# Patient Record
Sex: Female | Born: 1935 | Race: White | Hispanic: No | Smoking: Never smoker
Health system: Southern US, Community
[De-identification: ages and names within clinical notes are randomized; demographics above are authoritative.]

## PROBLEM LIST (undated history)

## (undated) DIAGNOSIS — IMO0002 Reserved for concepts with insufficient information to code with codable children: Secondary | ICD-10-CM

## (undated) DIAGNOSIS — I2699 Other pulmonary embolism without acute cor pulmonale: Secondary | ICD-10-CM

## (undated) DIAGNOSIS — Z789 Other specified health status: Secondary | ICD-10-CM

## (undated) DIAGNOSIS — K219 Gastro-esophageal reflux disease without esophagitis: Secondary | ICD-10-CM

## (undated) DIAGNOSIS — I2510 Atherosclerotic heart disease of native coronary artery without angina pectoris: Secondary | ICD-10-CM

## (undated) DIAGNOSIS — Z7901 Long term (current) use of anticoagulants: Secondary | ICD-10-CM

## (undated) DIAGNOSIS — I10 Essential (primary) hypertension: Secondary | ICD-10-CM

## (undated) DIAGNOSIS — K589 Irritable bowel syndrome without diarrhea: Secondary | ICD-10-CM

## (undated) DIAGNOSIS — F22 Delusional disorders: Secondary | ICD-10-CM

## (undated) DIAGNOSIS — I739 Peripheral vascular disease, unspecified: Secondary | ICD-10-CM

## (undated) DIAGNOSIS — K449 Diaphragmatic hernia without obstruction or gangrene: Secondary | ICD-10-CM

## (undated) DIAGNOSIS — F0392 Unspecified dementia, unspecified severity, with psychotic disturbance: Secondary | ICD-10-CM

## (undated) DIAGNOSIS — J411 Mucopurulent chronic bronchitis: Secondary | ICD-10-CM

## (undated) DIAGNOSIS — F0390 Unspecified dementia without behavioral disturbance: Secondary | ICD-10-CM

## (undated) DIAGNOSIS — Z951 Presence of aortocoronary bypass graft: Secondary | ICD-10-CM

## (undated) DIAGNOSIS — E785 Hyperlipidemia, unspecified: Secondary | ICD-10-CM

## (undated) DIAGNOSIS — K222 Esophageal obstruction: Secondary | ICD-10-CM

## (undated) DIAGNOSIS — T148XXA Other injury of unspecified body region, initial encounter: Secondary | ICD-10-CM

## (undated) DIAGNOSIS — I779 Disorder of arteries and arterioles, unspecified: Secondary | ICD-10-CM

## (undated) DIAGNOSIS — R002 Palpitations: Secondary | ICD-10-CM

## (undated) DIAGNOSIS — M1990 Unspecified osteoarthritis, unspecified site: Secondary | ICD-10-CM

## (undated) DIAGNOSIS — E669 Obesity, unspecified: Secondary | ICD-10-CM

## (undated) DIAGNOSIS — N200 Calculus of kidney: Secondary | ICD-10-CM

## (undated) DIAGNOSIS — R9430 Abnormal result of cardiovascular function study, unspecified: Secondary | ICD-10-CM

## (undated) DIAGNOSIS — I771 Stricture of artery: Secondary | ICD-10-CM

## (undated) DIAGNOSIS — F419 Anxiety disorder, unspecified: Secondary | ICD-10-CM

## (undated) DIAGNOSIS — S8991XA Unspecified injury of right lower leg, initial encounter: Secondary | ICD-10-CM

## (undated) DIAGNOSIS — R0602 Shortness of breath: Secondary | ICD-10-CM

## (undated) HISTORY — DX: Essential (primary) hypertension: I10

## (undated) HISTORY — DX: Disorder of arteries and arterioles, unspecified: I77.9

## (undated) HISTORY — DX: Obesity, unspecified: E66.9

## (undated) HISTORY — DX: Hyperlipidemia, unspecified: E78.5

## (undated) HISTORY — PX: ABDOMINAL HYSTERECTOMY: SHX81

## (undated) HISTORY — DX: Unspecified injury of right lower leg, initial encounter: S89.91XA

## (undated) HISTORY — PX: HEMORRHOID SURGERY: SHX153

## (undated) HISTORY — DX: Unspecified dementia without behavioral disturbance: F03.90

## (undated) HISTORY — DX: Peripheral vascular disease, unspecified: I73.9

## (undated) HISTORY — DX: Palpitations: R00.2

## (undated) HISTORY — DX: Reserved for concepts with insufficient information to code with codable children: IMO0002

## (undated) HISTORY — DX: Presence of aortocoronary bypass graft: Z95.1

## (undated) HISTORY — DX: Other specified health status: Z78.9

## (undated) HISTORY — DX: Delusional disorders: F22

## (undated) HISTORY — DX: Gastro-esophageal reflux disease without esophagitis: K21.9

## (undated) HISTORY — PX: APPENDECTOMY: SHX54

## (undated) HISTORY — DX: Shortness of breath: R06.02

## (undated) HISTORY — DX: Unspecified dementia, unspecified severity, with psychotic disturbance: F03.92

## (undated) HISTORY — DX: Esophageal obstruction: K22.2

## (undated) HISTORY — DX: Stricture of artery: I77.1

## (undated) HISTORY — DX: Other injury of unspecified body region, initial encounter: T14.8XXA

## (undated) HISTORY — DX: Unspecified dementia, unspecified severity, without behavioral disturbance, psychotic disturbance, mood disturbance, and anxiety: F03.90

## (undated) HISTORY — DX: Mucopurulent chronic bronchitis: J41.1

## (undated) HISTORY — PX: FRACTURE SURGERY: SHX138

## (undated) HISTORY — DX: Other pulmonary embolism without acute cor pulmonale: I26.99

## (undated) HISTORY — PX: CATARACT EXTRACTION: SUR2

## (undated) HISTORY — DX: Calculus of kidney: N20.0

## (undated) HISTORY — DX: Anxiety disorder, unspecified: F41.9

## (undated) HISTORY — DX: Long term (current) use of anticoagulants: Z79.01

## (undated) HISTORY — DX: Diaphragmatic hernia without obstruction or gangrene: K44.9

## (undated) HISTORY — DX: Unspecified osteoarthritis, unspecified site: M19.90

## (undated) HISTORY — DX: Irritable bowel syndrome, unspecified: K58.9

## (undated) HISTORY — DX: Atherosclerotic heart disease of native coronary artery without angina pectoris: I25.10

## (undated) HISTORY — DX: Abnormal result of cardiovascular function study, unspecified: R94.30

---

## 1997-09-22 ENCOUNTER — Encounter: Payer: Self-pay | Admitting: Internal Medicine

## 1997-10-11 ENCOUNTER — Encounter: Payer: Self-pay | Admitting: Internal Medicine

## 2000-12-05 ENCOUNTER — Ambulatory Visit (HOSPITAL_COMMUNITY): Admission: RE | Admit: 2000-12-05 | Discharge: 2000-12-06 | Payer: Self-pay | Admitting: Cardiology

## 2000-12-05 ENCOUNTER — Encounter: Payer: Self-pay | Admitting: Cardiology

## 2000-12-08 ENCOUNTER — Inpatient Hospital Stay (HOSPITAL_COMMUNITY): Admission: EM | Admit: 2000-12-08 | Discharge: 2000-12-15 | Payer: Self-pay | Admitting: Emergency Medicine

## 2000-12-08 ENCOUNTER — Encounter: Payer: Self-pay | Admitting: Emergency Medicine

## 2000-12-23 ENCOUNTER — Encounter: Admission: RE | Admit: 2000-12-23 | Discharge: 2000-12-23 | Payer: Self-pay | Admitting: Cardiology

## 2001-01-14 ENCOUNTER — Encounter (HOSPITAL_COMMUNITY): Admission: RE | Admit: 2001-01-14 | Discharge: 2001-04-14 | Payer: Self-pay | Admitting: Cardiology

## 2001-04-15 ENCOUNTER — Encounter (HOSPITAL_COMMUNITY): Admission: RE | Admit: 2001-04-15 | Discharge: 2001-07-14 | Payer: Self-pay | Admitting: Cardiology

## 2001-07-15 ENCOUNTER — Encounter (HOSPITAL_COMMUNITY): Admission: RE | Admit: 2001-07-15 | Discharge: 2001-09-05 | Payer: Self-pay | Admitting: Cardiology

## 2001-08-28 ENCOUNTER — Inpatient Hospital Stay (HOSPITAL_COMMUNITY): Admission: EM | Admit: 2001-08-28 | Discharge: 2001-09-09 | Payer: Self-pay

## 2001-09-04 ENCOUNTER — Encounter: Payer: Self-pay | Admitting: Thoracic Surgery (Cardiothoracic Vascular Surgery)

## 2001-09-05 ENCOUNTER — Encounter: Payer: Self-pay | Admitting: Thoracic Surgery (Cardiothoracic Vascular Surgery)

## 2001-09-06 ENCOUNTER — Encounter: Payer: Self-pay | Admitting: Thoracic Surgery (Cardiothoracic Vascular Surgery)

## 2001-10-21 ENCOUNTER — Encounter (HOSPITAL_COMMUNITY): Admission: RE | Admit: 2001-10-21 | Discharge: 2001-11-04 | Payer: Self-pay | Admitting: Cardiology

## 2001-11-05 HISTORY — PX: OTHER SURGICAL HISTORY: SHX169

## 2001-11-14 ENCOUNTER — Encounter: Payer: Self-pay | Admitting: Pulmonary Disease

## 2001-11-14 ENCOUNTER — Encounter: Admission: RE | Admit: 2001-11-14 | Discharge: 2001-11-14 | Payer: Self-pay | Admitting: Pulmonary Disease

## 2001-11-28 ENCOUNTER — Inpatient Hospital Stay (HOSPITAL_COMMUNITY): Admission: EM | Admit: 2001-11-28 | Discharge: 2001-12-09 | Payer: Self-pay | Admitting: Emergency Medicine

## 2001-12-01 ENCOUNTER — Encounter: Payer: Self-pay | Admitting: Pulmonary Disease

## 2001-12-19 ENCOUNTER — Encounter (HOSPITAL_BASED_OUTPATIENT_CLINIC_OR_DEPARTMENT_OTHER): Payer: Self-pay | Admitting: Internal Medicine

## 2001-12-19 ENCOUNTER — Ambulatory Visit (HOSPITAL_COMMUNITY): Admission: RE | Admit: 2001-12-19 | Discharge: 2001-12-19 | Payer: Self-pay | Admitting: Internal Medicine

## 2002-05-05 ENCOUNTER — Encounter (HOSPITAL_COMMUNITY): Admission: RE | Admit: 2002-05-05 | Discharge: 2002-08-03 | Payer: Self-pay | Admitting: Cardiology

## 2002-07-04 ENCOUNTER — Emergency Department (HOSPITAL_COMMUNITY): Admission: EM | Admit: 2002-07-04 | Discharge: 2002-07-04 | Payer: Self-pay

## 2002-08-05 ENCOUNTER — Encounter (HOSPITAL_COMMUNITY): Admission: RE | Admit: 2002-08-05 | Discharge: 2002-11-03 | Payer: Self-pay | Admitting: Cardiology

## 2002-08-27 ENCOUNTER — Inpatient Hospital Stay (HOSPITAL_COMMUNITY): Admission: EM | Admit: 2002-08-27 | Discharge: 2002-08-28 | Payer: Self-pay

## 2002-08-27 ENCOUNTER — Encounter: Payer: Self-pay | Admitting: Pulmonary Disease

## 2002-09-02 ENCOUNTER — Inpatient Hospital Stay (HOSPITAL_COMMUNITY): Admission: RE | Admit: 2002-09-02 | Discharge: 2002-09-04 | Payer: Self-pay | Admitting: Gastroenterology

## 2002-11-05 ENCOUNTER — Encounter (HOSPITAL_COMMUNITY): Admission: RE | Admit: 2002-11-05 | Discharge: 2003-02-03 | Payer: Self-pay | Admitting: Cardiology

## 2003-02-08 ENCOUNTER — Encounter: Payer: Self-pay | Admitting: Pulmonary Disease

## 2003-02-08 ENCOUNTER — Inpatient Hospital Stay (HOSPITAL_COMMUNITY): Admission: AD | Admit: 2003-02-08 | Discharge: 2003-02-10 | Payer: Self-pay | Admitting: Pulmonary Disease

## 2003-02-09 ENCOUNTER — Encounter: Payer: Self-pay | Admitting: Cardiology

## 2003-04-07 ENCOUNTER — Encounter (HOSPITAL_COMMUNITY): Admission: RE | Admit: 2003-04-07 | Discharge: 2003-07-06 | Payer: Self-pay | Admitting: Cardiology

## 2003-06-06 ENCOUNTER — Encounter: Payer: Self-pay | Admitting: Pulmonary Disease

## 2003-06-06 ENCOUNTER — Ambulatory Visit (HOSPITAL_COMMUNITY): Admission: RE | Admit: 2003-06-06 | Discharge: 2003-06-06 | Payer: Self-pay | Admitting: Pulmonary Disease

## 2003-06-27 ENCOUNTER — Encounter: Payer: Self-pay | Admitting: Emergency Medicine

## 2003-06-28 ENCOUNTER — Encounter: Payer: Self-pay | Admitting: Emergency Medicine

## 2003-06-28 ENCOUNTER — Inpatient Hospital Stay (HOSPITAL_COMMUNITY): Admission: EM | Admit: 2003-06-28 | Discharge: 2003-07-06 | Payer: Self-pay | Admitting: Emergency Medicine

## 2003-11-24 ENCOUNTER — Inpatient Hospital Stay (HOSPITAL_COMMUNITY): Admission: AD | Admit: 2003-11-24 | Discharge: 2003-11-26 | Payer: Self-pay | Admitting: Orthopedic Surgery

## 2003-12-01 ENCOUNTER — Inpatient Hospital Stay (HOSPITAL_COMMUNITY): Admission: RE | Admit: 2003-12-01 | Discharge: 2003-12-04 | Payer: Self-pay | Admitting: Orthopedic Surgery

## 2003-12-04 ENCOUNTER — Inpatient Hospital Stay
Admission: RE | Admit: 2003-12-04 | Discharge: 2003-12-10 | Payer: Self-pay | Admitting: Physical Medicine & Rehabilitation

## 2004-03-10 ENCOUNTER — Emergency Department (HOSPITAL_COMMUNITY): Admission: EM | Admit: 2004-03-10 | Discharge: 2004-03-11 | Payer: Self-pay | Admitting: Emergency Medicine

## 2004-05-16 ENCOUNTER — Encounter (HOSPITAL_COMMUNITY): Admission: RE | Admit: 2004-05-16 | Discharge: 2004-08-14 | Payer: Self-pay | Admitting: Cardiology

## 2004-07-30 ENCOUNTER — Emergency Department (HOSPITAL_COMMUNITY): Admission: EM | Admit: 2004-07-30 | Discharge: 2004-07-30 | Payer: Self-pay | Admitting: Emergency Medicine

## 2004-09-05 ENCOUNTER — Encounter (HOSPITAL_COMMUNITY): Admission: RE | Admit: 2004-09-05 | Discharge: 2004-12-04 | Payer: Self-pay | Admitting: Cardiology

## 2004-09-15 ENCOUNTER — Ambulatory Visit: Payer: Self-pay | Admitting: Cardiology

## 2004-10-06 ENCOUNTER — Ambulatory Visit: Payer: Self-pay | Admitting: Cardiology

## 2004-11-03 ENCOUNTER — Ambulatory Visit: Payer: Self-pay | Admitting: Internal Medicine

## 2004-11-07 ENCOUNTER — Ambulatory Visit: Payer: Self-pay | Admitting: Pulmonary Disease

## 2004-11-14 ENCOUNTER — Ambulatory Visit: Payer: Self-pay | Admitting: Cardiology

## 2004-12-01 ENCOUNTER — Ambulatory Visit: Payer: Self-pay | Admitting: Pulmonary Disease

## 2004-12-05 ENCOUNTER — Ambulatory Visit: Payer: Self-pay | Admitting: Cardiology

## 2004-12-06 ENCOUNTER — Encounter (HOSPITAL_COMMUNITY): Admission: RE | Admit: 2004-12-06 | Discharge: 2005-03-06 | Payer: Self-pay | Admitting: Cardiology

## 2004-12-25 ENCOUNTER — Ambulatory Visit: Payer: Self-pay | Admitting: *Deleted

## 2005-01-19 ENCOUNTER — Ambulatory Visit: Payer: Self-pay | Admitting: Cardiology

## 2005-01-29 ENCOUNTER — Ambulatory Visit: Payer: Self-pay | Admitting: Cardiology

## 2005-02-19 ENCOUNTER — Ambulatory Visit: Payer: Self-pay | Admitting: Cardiology

## 2005-02-23 ENCOUNTER — Ambulatory Visit: Payer: Self-pay | Admitting: Cardiology

## 2005-03-07 ENCOUNTER — Encounter (HOSPITAL_COMMUNITY): Admission: RE | Admit: 2005-03-07 | Discharge: 2005-06-05 | Payer: Self-pay | Admitting: Cardiology

## 2005-03-13 ENCOUNTER — Ambulatory Visit: Payer: Self-pay | Admitting: Pulmonary Disease

## 2005-03-19 ENCOUNTER — Ambulatory Visit: Payer: Self-pay | Admitting: Family Medicine

## 2005-03-23 ENCOUNTER — Ambulatory Visit: Payer: Self-pay | Admitting: Cardiovascular Disease

## 2005-03-30 ENCOUNTER — Ambulatory Visit: Payer: Self-pay | Admitting: Cardiology

## 2005-04-04 ENCOUNTER — Ambulatory Visit: Payer: Self-pay | Admitting: Cardiology

## 2005-04-20 ENCOUNTER — Ambulatory Visit: Payer: Self-pay | Admitting: Cardiology

## 2005-05-18 ENCOUNTER — Ambulatory Visit: Payer: Self-pay | Admitting: Cardiology

## 2005-06-06 ENCOUNTER — Encounter (HOSPITAL_COMMUNITY): Admission: RE | Admit: 2005-06-06 | Discharge: 2005-09-04 | Payer: Self-pay | Admitting: Cardiology

## 2005-06-15 ENCOUNTER — Ambulatory Visit: Payer: Self-pay | Admitting: Cardiology

## 2005-07-13 ENCOUNTER — Ambulatory Visit: Payer: Self-pay | Admitting: Pulmonary Disease

## 2005-07-30 ENCOUNTER — Ambulatory Visit: Payer: Self-pay | Admitting: Internal Medicine

## 2005-08-02 ENCOUNTER — Ambulatory Visit: Payer: Self-pay | Admitting: Pulmonary Disease

## 2005-08-09 ENCOUNTER — Ambulatory Visit: Payer: Self-pay | Admitting: Pulmonary Disease

## 2005-08-16 ENCOUNTER — Ambulatory Visit: Payer: Self-pay | Admitting: Cardiology

## 2005-09-03 ENCOUNTER — Ambulatory Visit: Payer: Self-pay | Admitting: Pulmonary Disease

## 2005-09-05 ENCOUNTER — Encounter: Admission: RE | Admit: 2005-09-05 | Discharge: 2005-12-04 | Payer: Self-pay | Admitting: Cardiology

## 2005-09-10 ENCOUNTER — Ambulatory Visit: Payer: Self-pay | Admitting: Internal Medicine

## 2005-09-14 ENCOUNTER — Ambulatory Visit: Payer: Self-pay | Admitting: Cardiology

## 2005-10-05 ENCOUNTER — Ambulatory Visit: Payer: Self-pay | Admitting: Cardiovascular Disease

## 2005-11-02 ENCOUNTER — Ambulatory Visit: Payer: Self-pay | Admitting: Cardiology

## 2005-11-12 ENCOUNTER — Ambulatory Visit: Payer: Self-pay | Admitting: Pulmonary Disease

## 2005-11-30 ENCOUNTER — Ambulatory Visit: Payer: Self-pay | Admitting: Cardiology

## 2005-12-06 ENCOUNTER — Encounter (HOSPITAL_COMMUNITY): Admission: RE | Admit: 2005-12-06 | Discharge: 2006-03-06 | Payer: Self-pay | Admitting: Cardiology

## 2005-12-17 ENCOUNTER — Ambulatory Visit: Payer: Self-pay | Admitting: Pulmonary Disease

## 2005-12-25 ENCOUNTER — Ambulatory Visit: Payer: Self-pay | Admitting: Pulmonary Disease

## 2005-12-28 ENCOUNTER — Ambulatory Visit: Payer: Self-pay | Admitting: Internal Medicine

## 2006-01-15 ENCOUNTER — Ambulatory Visit: Payer: Self-pay | Admitting: Pulmonary Disease

## 2006-01-22 ENCOUNTER — Ambulatory Visit: Payer: Self-pay | Admitting: Pulmonary Disease

## 2006-01-25 ENCOUNTER — Ambulatory Visit: Payer: Self-pay | Admitting: Cardiovascular Disease

## 2006-01-31 ENCOUNTER — Ambulatory Visit: Payer: Self-pay

## 2006-01-31 ENCOUNTER — Encounter: Payer: Self-pay | Admitting: Internal Medicine

## 2006-01-31 ENCOUNTER — Ambulatory Visit: Payer: Self-pay | Admitting: Cardiology

## 2006-02-19 ENCOUNTER — Ambulatory Visit: Payer: Self-pay | Admitting: Pulmonary Disease

## 2006-02-21 ENCOUNTER — Ambulatory Visit: Payer: Self-pay | Admitting: Internal Medicine

## 2006-03-05 ENCOUNTER — Ambulatory Visit: Payer: Self-pay | Admitting: Cardiology

## 2006-03-07 ENCOUNTER — Encounter (HOSPITAL_COMMUNITY): Admission: RE | Admit: 2006-03-07 | Discharge: 2006-06-05 | Payer: Self-pay | Admitting: Cardiology

## 2006-03-11 ENCOUNTER — Ambulatory Visit: Payer: Self-pay | Admitting: Internal Medicine

## 2006-03-27 ENCOUNTER — Ambulatory Visit: Payer: Self-pay | Admitting: Cardiology

## 2006-04-03 ENCOUNTER — Ambulatory Visit: Payer: Self-pay | Admitting: Cardiovascular Disease

## 2006-04-08 ENCOUNTER — Ambulatory Visit: Payer: Self-pay | Admitting: Pulmonary Disease

## 2006-04-15 ENCOUNTER — Ambulatory Visit: Payer: Self-pay | Admitting: Cardiology

## 2006-04-29 ENCOUNTER — Ambulatory Visit: Payer: Self-pay | Admitting: Cardiology

## 2006-05-07 ENCOUNTER — Ambulatory Visit: Payer: Self-pay | Admitting: Pulmonary Disease

## 2006-05-13 ENCOUNTER — Ambulatory Visit: Payer: Self-pay | Admitting: Internal Medicine

## 2006-05-15 ENCOUNTER — Ambulatory Visit: Payer: Self-pay | Admitting: Internal Medicine

## 2006-05-21 ENCOUNTER — Ambulatory Visit: Payer: Self-pay | Admitting: Cardiology

## 2006-05-22 ENCOUNTER — Ambulatory Visit: Payer: Self-pay | Admitting: Internal Medicine

## 2006-05-30 ENCOUNTER — Ambulatory Visit (HOSPITAL_COMMUNITY): Admission: RE | Admit: 2006-05-30 | Discharge: 2006-05-30 | Payer: Self-pay | Admitting: Internal Medicine

## 2006-05-30 ENCOUNTER — Encounter: Payer: Self-pay | Admitting: Internal Medicine

## 2006-06-04 ENCOUNTER — Ambulatory Visit: Payer: Self-pay | Admitting: Internal Medicine

## 2006-06-04 ENCOUNTER — Ambulatory Visit: Payer: Self-pay | Admitting: Cardiology

## 2006-06-06 ENCOUNTER — Encounter (HOSPITAL_COMMUNITY): Admission: RE | Admit: 2006-06-06 | Discharge: 2006-09-04 | Payer: Self-pay | Admitting: Cardiology

## 2006-06-10 ENCOUNTER — Ambulatory Visit: Payer: Self-pay | Admitting: Internal Medicine

## 2006-06-13 ENCOUNTER — Emergency Department (HOSPITAL_COMMUNITY): Admission: EM | Admit: 2006-06-13 | Discharge: 2006-06-13 | Payer: Self-pay | Admitting: Emergency Medicine

## 2006-06-24 ENCOUNTER — Ambulatory Visit: Payer: Self-pay | Admitting: Internal Medicine

## 2006-07-01 ENCOUNTER — Ambulatory Visit: Payer: Self-pay | Admitting: Pulmonary Disease

## 2006-07-10 ENCOUNTER — Ambulatory Visit: Payer: Self-pay | Admitting: *Deleted

## 2006-07-24 ENCOUNTER — Ambulatory Visit: Payer: Self-pay | Admitting: Cardiology

## 2006-08-14 ENCOUNTER — Ambulatory Visit: Payer: Self-pay | Admitting: Cardiology

## 2006-08-28 ENCOUNTER — Ambulatory Visit: Payer: Self-pay | Admitting: *Deleted

## 2006-09-11 ENCOUNTER — Ambulatory Visit: Payer: Self-pay | Admitting: Cardiology

## 2006-09-20 ENCOUNTER — Ambulatory Visit: Payer: Self-pay | Admitting: Cardiovascular Disease

## 2006-09-24 ENCOUNTER — Ambulatory Visit: Payer: Self-pay | Admitting: Pulmonary Disease

## 2006-10-08 ENCOUNTER — Ambulatory Visit: Payer: Self-pay | Admitting: Cardiology

## 2006-10-17 ENCOUNTER — Ambulatory Visit: Payer: Self-pay

## 2006-10-24 ENCOUNTER — Ambulatory Visit: Payer: Self-pay | Admitting: Cardiology

## 2006-11-18 ENCOUNTER — Ambulatory Visit: Payer: Self-pay | Admitting: Cardiology

## 2006-12-05 ENCOUNTER — Ambulatory Visit: Payer: Self-pay | Admitting: Cardiology

## 2007-01-01 ENCOUNTER — Ambulatory Visit: Payer: Self-pay | Admitting: Internal Medicine

## 2007-01-15 ENCOUNTER — Ambulatory Visit: Payer: Self-pay | Admitting: Cardiology

## 2007-01-22 ENCOUNTER — Ambulatory Visit: Payer: Self-pay | Admitting: Pulmonary Disease

## 2007-01-22 LAB — CONVERTED CEMR LAB
BUN: 11 mg/dL (ref 6–23)
Basophils Absolute: 0.1 10*3/uL (ref 0.0–0.1)
Bilirubin, Direct: 0.1 mg/dL (ref 0.0–0.3)
CO2: 31 meq/L (ref 19–32)
Chloride: 108 meq/L (ref 96–112)
Creatinine, Ser: 0.8 mg/dL (ref 0.4–1.2)
HCT: 39 % (ref 36.0–46.0)
Hgb A1c MFr Bld: 6.7 % — ABNORMAL HIGH (ref 4.6–6.0)
MCV: 88 fL (ref 78.0–100.0)
Neutrophils Relative %: 51.8 % (ref 43.0–77.0)
Platelets: 207 10*3/uL (ref 150–400)
Potassium: 4.2 meq/L (ref 3.5–5.1)
RBC: 4.43 M/uL (ref 3.87–5.11)
RDW: 13.3 % (ref 11.5–14.6)
TSH: 1.99 microintl units/mL (ref 0.35–5.50)

## 2007-02-10 ENCOUNTER — Ambulatory Visit: Payer: Self-pay | Admitting: Cardiology

## 2007-02-18 ENCOUNTER — Ambulatory Visit: Payer: Self-pay | Admitting: Pulmonary Disease

## 2007-02-24 ENCOUNTER — Ambulatory Visit: Payer: Self-pay | Admitting: Cardiology

## 2007-03-26 ENCOUNTER — Ambulatory Visit: Payer: Self-pay | Admitting: Cardiology

## 2007-04-23 ENCOUNTER — Ambulatory Visit: Payer: Self-pay | Admitting: Cardiology

## 2007-04-28 ENCOUNTER — Ambulatory Visit: Payer: Self-pay | Admitting: Pulmonary Disease

## 2007-05-01 ENCOUNTER — Ambulatory Visit: Payer: Self-pay | Admitting: Pulmonary Disease

## 2007-05-01 LAB — CONVERTED CEMR LAB
AST: 23 units/L (ref 0–37)
Alkaline Phosphatase: 45 units/L (ref 39–117)
BUN: 8 mg/dL (ref 6–23)
CO2: 30 meq/L (ref 19–32)
Chloride: 112 meq/L (ref 96–112)
Cholesterol: 152 mg/dL (ref 0–200)
Creatinine, Ser: 0.9 mg/dL (ref 0.4–1.2)
Direct LDL: 71.4 mg/dL
HDL: 33.5 mg/dL — ABNORMAL LOW (ref >39.0)
Hgb A1c MFr Bld: 6.4 % — ABNORMAL HIGH (ref 4.6–6.0)
Sodium: 147 meq/L — ABNORMAL HIGH (ref 135–145)
Total Protein: 6.2 g/dL (ref 6.0–8.3)
Triglycerides: 276 mg/dL (ref 0–149)

## 2007-05-06 ENCOUNTER — Ambulatory Visit: Payer: Self-pay | Admitting: Cardiology

## 2007-05-14 ENCOUNTER — Ambulatory Visit: Payer: Self-pay | Admitting: Cardiology

## 2007-05-14 ENCOUNTER — Observation Stay (HOSPITAL_COMMUNITY): Admission: EM | Admit: 2007-05-14 | Discharge: 2007-05-15 | Payer: Self-pay | Admitting: Emergency Medicine

## 2007-05-21 ENCOUNTER — Ambulatory Visit: Payer: Self-pay | Admitting: Internal Medicine

## 2007-05-23 ENCOUNTER — Ambulatory Visit: Payer: Self-pay

## 2007-05-30 ENCOUNTER — Ambulatory Visit: Payer: Self-pay | Admitting: Cardiology

## 2007-06-11 ENCOUNTER — Ambulatory Visit: Payer: Self-pay | Admitting: Cardiology

## 2007-06-26 ENCOUNTER — Ambulatory Visit: Payer: Self-pay | Admitting: Cardiology

## 2007-07-09 ENCOUNTER — Ambulatory Visit: Payer: Self-pay | Admitting: Internal Medicine

## 2007-07-24 ENCOUNTER — Ambulatory Visit: Payer: Self-pay | Admitting: Cardiology

## 2007-08-07 ENCOUNTER — Ambulatory Visit: Payer: Self-pay | Admitting: Cardiology

## 2007-08-28 ENCOUNTER — Ambulatory Visit: Payer: Self-pay | Admitting: Cardiology

## 2007-09-01 ENCOUNTER — Ambulatory Visit: Payer: Self-pay | Admitting: Cardiology

## 2007-09-02 ENCOUNTER — Ambulatory Visit: Payer: Self-pay | Admitting: Pulmonary Disease

## 2007-09-25 ENCOUNTER — Ambulatory Visit: Payer: Self-pay | Admitting: Cardiology

## 2007-11-10 DIAGNOSIS — M1990 Unspecified osteoarthritis, unspecified site: Secondary | ICD-10-CM | POA: Insufficient documentation

## 2007-11-10 DIAGNOSIS — K589 Irritable bowel syndrome without diarrhea: Secondary | ICD-10-CM | POA: Insufficient documentation

## 2007-11-10 DIAGNOSIS — F411 Generalized anxiety disorder: Secondary | ICD-10-CM | POA: Insufficient documentation

## 2007-11-17 ENCOUNTER — Telehealth (INDEPENDENT_AMBULATORY_CARE_PROVIDER_SITE_OTHER): Payer: Self-pay | Admitting: *Deleted

## 2007-11-21 ENCOUNTER — Ambulatory Visit: Payer: Self-pay | Admitting: Cardiology

## 2007-12-18 ENCOUNTER — Ambulatory Visit: Payer: Self-pay | Admitting: Pulmonary Disease

## 2007-12-18 DIAGNOSIS — N200 Calculus of kidney: Secondary | ICD-10-CM | POA: Insufficient documentation

## 2007-12-18 DIAGNOSIS — J42 Unspecified chronic bronchitis: Secondary | ICD-10-CM

## 2007-12-18 DIAGNOSIS — R930 Abnormal findings on diagnostic imaging of skull and head, not elsewhere classified: Secondary | ICD-10-CM | POA: Insufficient documentation

## 2007-12-18 DIAGNOSIS — M810 Age-related osteoporosis without current pathological fracture: Secondary | ICD-10-CM | POA: Insufficient documentation

## 2007-12-18 DIAGNOSIS — I739 Peripheral vascular disease, unspecified: Secondary | ICD-10-CM

## 2007-12-19 ENCOUNTER — Ambulatory Visit: Payer: Self-pay | Admitting: Internal Medicine

## 2007-12-24 ENCOUNTER — Ambulatory Visit: Payer: Self-pay | Admitting: Pulmonary Disease

## 2007-12-27 LAB — CONVERTED CEMR LAB
AST: 24 units/L (ref 0–37)
Bilirubin, Direct: 0.1 mg/dL (ref 0.0–0.3)
Chloride: 110 meq/L (ref 96–112)
Creatinine, Ser: 0.9 mg/dL (ref 0.4–1.2)
Direct LDL: 75.6 mg/dL
Eosinophils Absolute: 0.3 10*3/uL (ref 0.0–0.6)
Eosinophils Relative: 4.3 % (ref 0.0–5.0)
GFR calc non Af Amer: 65 mL/min
Glucose, Bld: 124 mg/dL — ABNORMAL HIGH (ref 70–99)
HCT: 38.5 % (ref 36.0–46.0)
Hemoglobin: 12.9 g/dL (ref 12.0–15.0)
Hgb A1c MFr Bld: 6.6 % — ABNORMAL HIGH (ref 4.6–6.0)
Lymphocytes Relative: 34.6 % (ref 12.0–46.0)
MCHC: 33.5 g/dL (ref 30.0–36.0)
MCV: 91.1 fL (ref 78.0–100.0)
Monocytes Absolute: 0.5 10*3/uL (ref 0.2–0.7)
Neutrophils Relative %: 51.6 % (ref 43.0–77.0)
Potassium: 5 meq/L (ref 3.5–5.1)
RBC: 4.22 M/uL (ref 3.87–5.11)
Sodium: 145 meq/L (ref 135–145)
Total Bilirubin: 0.6 mg/dL (ref 0.3–1.2)
Triglycerides: 249 mg/dL (ref 0–149)
WBC: 5.9 10*3/uL (ref 4.5–10.5)

## 2008-01-09 ENCOUNTER — Ambulatory Visit: Payer: Self-pay | Admitting: Cardiology

## 2008-01-23 ENCOUNTER — Ambulatory Visit: Payer: Self-pay | Admitting: Cardiovascular Disease

## 2008-02-06 ENCOUNTER — Ambulatory Visit: Payer: Self-pay | Admitting: Cardiology

## 2008-03-02 ENCOUNTER — Ambulatory Visit: Payer: Self-pay | Admitting: Cardiology

## 2008-03-16 ENCOUNTER — Ambulatory Visit: Payer: Self-pay | Admitting: Cardiovascular Disease

## 2008-03-16 ENCOUNTER — Ambulatory Visit: Payer: Self-pay | Admitting: Cardiology

## 2008-04-07 ENCOUNTER — Ambulatory Visit: Payer: Self-pay | Admitting: Cardiology

## 2008-04-16 ENCOUNTER — Ambulatory Visit: Payer: Self-pay | Admitting: Pulmonary Disease

## 2008-04-19 ENCOUNTER — Telehealth (INDEPENDENT_AMBULATORY_CARE_PROVIDER_SITE_OTHER): Payer: Self-pay | Admitting: *Deleted

## 2008-05-05 ENCOUNTER — Ambulatory Visit: Payer: Self-pay | Admitting: Internal Medicine

## 2008-06-02 ENCOUNTER — Ambulatory Visit: Payer: Self-pay | Admitting: Cardiology

## 2008-06-11 ENCOUNTER — Ambulatory Visit: Payer: Self-pay | Admitting: Cardiology

## 2008-06-23 ENCOUNTER — Ambulatory Visit: Payer: Self-pay | Admitting: Cardiology

## 2008-07-15 ENCOUNTER — Ambulatory Visit: Payer: Self-pay | Admitting: Pulmonary Disease

## 2008-07-15 ENCOUNTER — Ambulatory Visit: Payer: Self-pay | Admitting: Cardiology

## 2008-07-18 LAB — CONVERTED CEMR LAB
ALT: 22 units/L (ref 0–35)
AST: 28 units/L (ref 0–37)
Albumin: 4 g/dL (ref 3.5–5.2)
BUN: 13 mg/dL (ref 6–23)
Basophils Relative: 0.9 % (ref 0.0–3.0)
CO2: 29 meq/L (ref 19–32)
Chloride: 108 meq/L (ref 96–112)
Creatinine, Ser: 0.9 mg/dL (ref 0.4–1.2)
Eosinophils Relative: 3.7 % (ref 0.0–5.0)
Glucose, Bld: 103 mg/dL — ABNORMAL HIGH (ref 70–99)
Hgb A1c MFr Bld: 6.3 % — ABNORMAL HIGH (ref 4.6–6.0)
LDL Cholesterol: 55 mg/dL (ref 0–99)
Lymphocytes Relative: 31.7 % (ref 12.0–46.0)
Monocytes Relative: 6.9 % (ref 3.0–12.0)
Neutrophils Relative %: 56.8 % (ref 43.0–77.0)
RBC: 4.31 M/uL (ref 3.87–5.11)
Total Bilirubin: 0.7 mg/dL (ref 0.3–1.2)
VLDL: 28 mg/dL (ref 0–40)
WBC: 7.7 10*3/uL (ref 4.5–10.5)

## 2008-07-19 ENCOUNTER — Encounter: Payer: Self-pay | Admitting: Pulmonary Disease

## 2008-07-19 ENCOUNTER — Ambulatory Visit: Payer: Self-pay | Admitting: Internal Medicine

## 2008-07-20 ENCOUNTER — Encounter: Payer: Self-pay | Admitting: Pulmonary Disease

## 2008-08-06 ENCOUNTER — Ambulatory Visit: Payer: Self-pay | Admitting: Cardiovascular Disease

## 2008-08-27 ENCOUNTER — Ambulatory Visit: Payer: Self-pay | Admitting: Cardiology

## 2008-09-03 ENCOUNTER — Ambulatory Visit: Payer: Self-pay | Admitting: Internal Medicine

## 2008-09-13 ENCOUNTER — Telehealth: Payer: Self-pay | Admitting: Pulmonary Disease

## 2008-09-14 ENCOUNTER — Telehealth: Payer: Self-pay | Admitting: Pulmonary Disease

## 2008-09-16 ENCOUNTER — Ambulatory Visit: Payer: Self-pay | Admitting: Pulmonary Disease

## 2008-09-16 DIAGNOSIS — F0390 Unspecified dementia without behavioral disturbance: Secondary | ICD-10-CM

## 2008-09-16 DIAGNOSIS — F22 Delusional disorders: Secondary | ICD-10-CM

## 2008-09-20 ENCOUNTER — Encounter: Payer: Self-pay | Admitting: Pulmonary Disease

## 2008-09-20 ENCOUNTER — Encounter: Admission: RE | Admit: 2008-09-20 | Discharge: 2008-09-20 | Payer: Self-pay | Admitting: Neurology

## 2008-09-20 ENCOUNTER — Ambulatory Visit: Payer: Self-pay | Admitting: Cardiology

## 2008-09-28 ENCOUNTER — Emergency Department (HOSPITAL_COMMUNITY): Admission: EM | Admit: 2008-09-28 | Discharge: 2008-09-29 | Payer: Self-pay | Admitting: Emergency Medicine

## 2008-10-01 ENCOUNTER — Inpatient Hospital Stay (HOSPITAL_COMMUNITY): Admission: AD | Admit: 2008-10-01 | Discharge: 2008-10-08 | Payer: Self-pay | Admitting: Pulmonary Disease

## 2008-10-01 ENCOUNTER — Ambulatory Visit: Payer: Self-pay | Admitting: Emergency Medicine

## 2008-10-01 ENCOUNTER — Ambulatory Visit: Payer: Self-pay | Admitting: Internal Medicine

## 2008-11-30 ENCOUNTER — Ambulatory Visit: Payer: Self-pay | Admitting: Adult Health

## 2008-12-01 ENCOUNTER — Telehealth (INDEPENDENT_AMBULATORY_CARE_PROVIDER_SITE_OTHER): Payer: Self-pay | Admitting: *Deleted

## 2008-12-06 LAB — CONVERTED CEMR LAB
BUN: 16 mg/dL (ref 6–23)
GFR calc Af Amer: 104 mL/min
Glucose, Bld: 92 mg/dL (ref 70–99)
Potassium: 4.7 meq/L (ref 3.5–5.1)

## 2008-12-14 ENCOUNTER — Telehealth: Payer: Self-pay | Admitting: Pulmonary Disease

## 2008-12-17 ENCOUNTER — Encounter (HOSPITAL_BASED_OUTPATIENT_CLINIC_OR_DEPARTMENT_OTHER): Admission: RE | Admit: 2008-12-17 | Discharge: 2009-03-08 | Payer: Self-pay | Admitting: General Surgery

## 2008-12-31 ENCOUNTER — Ambulatory Visit: Payer: Self-pay | Admitting: Cardiology

## 2009-01-12 ENCOUNTER — Ambulatory Visit: Payer: Self-pay | Admitting: Pulmonary Disease

## 2009-01-14 ENCOUNTER — Encounter: Payer: Self-pay | Admitting: Pulmonary Disease

## 2009-01-14 LAB — CONVERTED CEMR LAB
Alkaline Phosphatase: 40 units/L (ref 39–117)
Basophils Relative: 0 % (ref 0.0–3.0)
Bilirubin, Direct: 0.1 mg/dL (ref 0.0–0.3)
Eosinophils Relative: 3.3 % (ref 0.0–5.0)
GFR calc Af Amer: 89 mL/min
GFR calc non Af Amer: 74 mL/min
HCT: 40.1 % (ref 36.0–46.0)
Hemoglobin: 13.4 g/dL (ref 12.0–15.0)
INR: 0.9 (ref 0.8–1.0)
Monocytes Relative: 6.4 % (ref 3.0–12.0)
Potassium: 4.8 meq/L (ref 3.5–5.1)
RDW: 13.4 % (ref 11.5–14.6)
Sodium: 142 meq/L (ref 135–145)
TSH: 1.96 microintl units/mL (ref 0.35–5.50)
Total Bilirubin: 0.6 mg/dL (ref 0.3–1.2)
Vit D, 25-Hydroxy: 29 ng/mL — ABNORMAL LOW (ref 30–89)

## 2009-02-23 ENCOUNTER — Ambulatory Visit: Payer: Self-pay | Admitting: Pulmonary Disease

## 2009-02-28 ENCOUNTER — Telehealth (INDEPENDENT_AMBULATORY_CARE_PROVIDER_SITE_OTHER): Payer: Self-pay | Admitting: *Deleted

## 2009-03-08 ENCOUNTER — Telehealth (INDEPENDENT_AMBULATORY_CARE_PROVIDER_SITE_OTHER): Payer: Self-pay | Admitting: *Deleted

## 2009-03-23 ENCOUNTER — Ambulatory Visit: Payer: Self-pay | Admitting: Pulmonary Disease

## 2009-04-05 ENCOUNTER — Encounter: Payer: Self-pay | Admitting: *Deleted

## 2009-04-07 ENCOUNTER — Encounter: Payer: Self-pay | Admitting: Pulmonary Disease

## 2009-04-07 ENCOUNTER — Telehealth: Payer: Self-pay | Admitting: Pulmonary Disease

## 2009-04-13 ENCOUNTER — Ambulatory Visit: Payer: Self-pay | Admitting: Pulmonary Disease

## 2009-04-13 ENCOUNTER — Telehealth (INDEPENDENT_AMBULATORY_CARE_PROVIDER_SITE_OTHER): Payer: Self-pay | Admitting: *Deleted

## 2009-04-14 ENCOUNTER — Encounter: Payer: Self-pay | Admitting: Pulmonary Disease

## 2009-04-14 ENCOUNTER — Encounter: Admission: RE | Admit: 2009-04-14 | Discharge: 2009-04-14 | Payer: Self-pay | Admitting: Pulmonary Disease

## 2009-05-04 ENCOUNTER — Ambulatory Visit: Payer: Self-pay | Admitting: Pulmonary Disease

## 2009-05-11 ENCOUNTER — Encounter: Payer: Self-pay | Admitting: *Deleted

## 2009-07-04 ENCOUNTER — Encounter: Payer: Self-pay | Admitting: Cardiology

## 2009-07-05 ENCOUNTER — Ambulatory Visit: Payer: Self-pay | Admitting: Cardiology

## 2009-07-22 ENCOUNTER — Telehealth (INDEPENDENT_AMBULATORY_CARE_PROVIDER_SITE_OTHER): Payer: Self-pay | Admitting: *Deleted

## 2009-08-30 ENCOUNTER — Ambulatory Visit: Payer: Self-pay | Admitting: Pulmonary Disease

## 2009-10-06 LAB — CONVERTED CEMR LAB
Basophils Relative: 0.2 % (ref 0.0–3.0)
Chloride: 104 meq/L (ref 96–112)
Eosinophils Relative: 2 % (ref 0.0–5.0)
GFR calc non Af Amer: 85.89 mL/min (ref >60)
HCT: 41.6 % (ref 36.0–46.0)
Lymphs Abs: 3 10*3/uL (ref 0.7–4.0)
MCV: 93.5 fL (ref 78.0–100.0)
Monocytes Absolute: 0.6 10*3/uL (ref 0.1–1.0)
Monocytes Relative: 6.1 % (ref 3.0–12.0)
Neutrophils Relative %: 60.3 % (ref 43.0–77.0)
Potassium: 4.2 meq/L (ref 3.5–5.1)
RBC: 4.45 M/uL (ref 3.87–5.11)
WBC: 9.4 10*3/uL (ref 4.5–10.5)

## 2010-01-13 ENCOUNTER — Telehealth (INDEPENDENT_AMBULATORY_CARE_PROVIDER_SITE_OTHER): Payer: Self-pay | Admitting: *Deleted

## 2010-01-16 ENCOUNTER — Ambulatory Visit: Payer: Self-pay | Admitting: Pulmonary Disease

## 2010-01-17 ENCOUNTER — Telehealth: Payer: Self-pay | Admitting: Pulmonary Disease

## 2010-01-17 ENCOUNTER — Encounter: Payer: Self-pay | Admitting: Pulmonary Disease

## 2010-01-18 ENCOUNTER — Encounter: Payer: Self-pay | Admitting: Pulmonary Disease

## 2010-01-18 LAB — CONVERTED CEMR LAB
Albumin: 3.5 g/dL (ref 3.5–5.2)
BUN: 13 mg/dL (ref 6–23)
Bilirubin Urine: NEGATIVE
CO2: 34 meq/L — ABNORMAL HIGH (ref 19–32)
Calcium: 9.4 mg/dL (ref 8.4–10.5)
Creatinine, Ser: 0.9 mg/dL (ref 0.4–1.2)
Eosinophils Relative: 2.7 % (ref 0.0–5.0)
GFR calc non Af Amer: 64.2 mL/min (ref >60)
Glucose, Bld: 120 mg/dL — ABNORMAL HIGH (ref 70–99)
HCT: 38 % (ref 36.0–46.0)
Hemoglobin, Urine: NEGATIVE
Hemoglobin: 13 g/dL (ref 12.0–15.0)
Hgb A1c MFr Bld: 6.5 % (ref 4.6–6.5)
Ketones, ur: NEGATIVE mg/dL
Lymphs Abs: 2.3 10*3/uL (ref 0.7–4.0)
MCV: 94.5 fL (ref 78.0–100.0)
Monocytes Absolute: 0.4 10*3/uL (ref 0.1–1.0)
Monocytes Relative: 7.3 % (ref 3.0–12.0)
Neutro Abs: 3 10*3/uL (ref 1.4–7.7)
Nitrite: NEGATIVE
Platelets: 129 10*3/uL — ABNORMAL LOW (ref 150.0–400.0)
Sed Rate: 19 mm/hr (ref 0–22)
Sodium: 145 meq/L (ref 135–145)
TSH: 3.19 microintl units/mL (ref 0.35–5.50)
Total Protein: 6.4 g/dL (ref 6.0–8.3)
WBC: 5.9 10*3/uL (ref 4.5–10.5)

## 2010-01-20 ENCOUNTER — Telehealth: Payer: Self-pay | Admitting: Pulmonary Disease

## 2010-02-27 ENCOUNTER — Telehealth (INDEPENDENT_AMBULATORY_CARE_PROVIDER_SITE_OTHER): Payer: Self-pay | Admitting: *Deleted

## 2010-02-27 ENCOUNTER — Telehealth: Payer: Self-pay | Admitting: Internal Medicine

## 2010-03-01 ENCOUNTER — Ambulatory Visit: Payer: Self-pay | Admitting: Internal Medicine

## 2010-03-01 DIAGNOSIS — K222 Esophageal obstruction: Secondary | ICD-10-CM

## 2010-03-01 DIAGNOSIS — R1310 Dysphagia, unspecified: Secondary | ICD-10-CM | POA: Insufficient documentation

## 2010-04-04 ENCOUNTER — Ambulatory Visit: Payer: Self-pay | Admitting: Internal Medicine

## 2010-04-04 LAB — CONVERTED CEMR LAB: UREASE: NEGATIVE

## 2010-07-05 ENCOUNTER — Encounter: Payer: Self-pay | Admitting: Cardiology

## 2010-07-06 ENCOUNTER — Ambulatory Visit: Payer: Self-pay | Admitting: Cardiology

## 2010-12-07 NOTE — Progress Notes (Signed)
Summary: Lidoderm Patch prior auth initiated  Phone Note Outgoing Call   Call placed by: Michel Bickers CMA,  January 17, 2010 11:21 AM Call placed to: [employer] Summary of Call: Prior auth initiated on [employer]. Form was received from [employer] and placed on SN's cart for review and signature. Initial call taken by: Michel Bickers CMA,  January 17, 2010 11:21 AM  Follow-up for Phone Call        SN has filled out form and signed and this has been faxed back for approval of lidoderm patch Randell Loop CMA  January 17, 2010 4:19 PM      Appended Document: Lidoderm Patch prior auth initiated approval letter received from [facility] from 12/27/2009 through 01/17/2011--letter has been scanned into pts chart

## 2010-12-07 NOTE — Procedures (Signed)
Summary: EGD   EGD  Procedure date:  05/30/2006  Findings:      [facility]:  [facility]  Findings: Stricture:  GERD  Hiatal Hernia Patient Name: [name], [name] MRN: [id number] Procedure Procedures: Panendoscopy (EGD) CPT: 43235.    with esophageal dilation. CPT: G9296129.  Personnel: Endoscopist: Wilhemina Bonito. Marina Goodell, MD.  Exam Location: Exam performed in Endoscopy Suite.  Patient Consent: Procedure, Alternatives, Risks and Benefits discussed, consent obtained, from patient. Consent was obtained by the RN.  Indications  Therapeutics: Reason for exam: Esophageal dilation.  Symptoms: Dysphagia.  History  Current Medications: Patient is currently taking Coumadin.  Comments: Coumadin held since 7-22 Pre-Exam Physical: Performed May 30, 2006  Cardio-pulmonary exam, HEENT exam, Abdominal exam, Mental status exam WNL.  Comments: Pt. history reviewed/updated, physical exam performed prior to initiation of sedation?yes Exam Exam Info: Maximum depth of insertion Duodenum, intended Duodenum. Patient position: on left side. Vocal cords visualized. Gastric retroflexion performed. Images taken. ASA Classification: III. Tolerance: excellent.  Sedation Meds: Patient assessed and found to be appropriate for moderate (conscious) sedation. Demerol 50 mg. given IV. Versed 5.5 mg. given IV. Cetacaine Spray 2 sprays given aerosolized.  Monitoring: BP and pulse monitoring done. Oximetry used. Supplemental O2 given  Fluoroscopy: Fluoroscopy was used.  Findings STRICTURE / STENOSIS: Stricture in Distal Esophagus.  Constriction: partial. Etiology: benign due to reflux. 35 cm from mouth. Lumen diameter is 15 mm. ICD9: Esophageal Stricture: 530.3. Comment: mild esophagitis w/ edema. No Barrett's.  - Dilation: Distal Esophagus. Procedure was performed under Fluoroscopy. Wire Guided/Savary (Wilson-Cook) dilator used, Diameter: 18 mm, No Resistance, No Heme present on extraction. 1   total dilators used. Patient tolerance excellent.  HIATAL HERNIA:   Assessment  Diagnoses: 530.3: Esophageal Stricture. s/p dilation.  553.3: Hernia, Hiatal.  530.81: GERD.   Events  Unplanned Intervention: No unplanned interventions were required.  Unplanned Events: There were no complications. Plans Instructions: Nothing to eat or drink for 2 hrs.  Clear or full liquids: 2 hrs. Resume previous diet: am. Restart medications: today including coumadin.  Comments: Increase prilosec (omeprazole) to 20mg  twice daily Disposition: After procedure patient sent to recovery. After recovery patient sent home.  Scheduling: Follow-up prn.   This report was created from the original endoscopy report, which was reviewed and signed by the above listed endoscopist.   cc:  Alroy Dust, MD      Willa Rough, MD      the Patient

## 2010-12-07 NOTE — Assessment & Plan Note (Signed)
Summary: f1y  Medications Added PLAVIX 75 MG TABS (CLOPIDOGREL BISULFATE) 1 by mouth once daily      Allergies Added:   Visit Type:  Follow-up Primary Provider:  Chauncey Mann MD  CC:  CAD.  History of Present Illness: The patient is seen for follow up of coronary disease.  She is having only rare chest discomfort.  Her last nuclear scan was done in 2008 and it showed only very slight ischemia.  She can be followed medically.  Current Medications (verified): 1)  Nitrostat 0.4 Mg  Subl (Nitroglycerin) .... Place 1 Tab Under The Tongue For Chest Pain... As Needed 2)  Metoprolol Tartrate 25 Mg  Tabs (Metoprolol Tartrate) .... Take 1 Tab By Mouth Two Times A Day... 3)  Losartan Potassium 50 Mg Tabs (Losartan Potassium) .... Take 1 Tablet By Mouth Once A Day 4)  Bumetanide 0.5 Mg  Tabs (Bumetanide) .... Take 1 Tablet By Mouth Once A Day 5)  Caduet 5-20 Mg  Tabs (Amlodipine-Atorvastatin) .... Take 1 Tab By Mouth Once Daily.Marland KitchenMarland Kitchen 6)  Metformin Hcl 500 Mg  Tabs (Metformin Hcl) .... Take One Tablet By Mouth Once Daily 7)  Glimepiride 1 Mg  Tabs (Glimepiride) .... Take 1/2 Tablet By Mouth Every Morning 8)  Omeprazole 20 Mg  Cpdr (Omeprazole) .... Take 1 Tab By Mouth Once Daily.Marland KitchenMarland Kitchen 9)  Boniva 150 Mg Tabs (Ibandronate Sodium) .... Take 1 Tab By Mouth Each Month... 10)  Caltrate 600+d 600-400 Mg-Unit Tabs (Calcium Carbonate-Vitamin D) .... Take 1 Tab By Mouth Two Times A Day... 11)  Multivitamins  Tabs (Multiple Vitamin) .... Take 1 Tab By Mouth Once Daily.Marland Kitchenas Needed 12)  Vitamin D3 1000 Unit Caps (Cholecalciferol) .... Take 1 Cap Daily... 13)  Clorazepate Dipotassium 7.5 Mg  Tabs (Clorazepate Dipotassium) .... Take 1/2 To 1 Tablet By Mouth Three Times A Day As Needed For Anxiety... 14)  Tramadol Hcl 50 Mg Tabs (Tramadol Hcl) .... Take 1 Tab By Mouth Every 6h As Needed For Pain 15)  Lidoderm 5 % Ptch (Lidocaine) .... Apply One Patch To The Painful Area- On For 12h, Then Remove For 12h, and Repeat As  Needed. 16)  Plavix 75 Mg Tabs (Clopidogrel Bisulfate) .Marland Kitchen.. 1 By Mouth Once Daily  Allergies (verified): 1)  Asa 2)  * Avalide 3)  * Toprol 4)  Pcn 5)  Iodine  Past History:  Past Medical History: Hx of BRONCHITIS, RECURRENT (ICD-491.9) ABNORMAL CHEST XRAY (ICD-793.1) Hx of PULMONARY EMBOLISM (ICD-415.19) Coumadin Rx stopped 11/09 hosp w/ large hematoma from fall HYPERTENSION (ICD-401.9) CORONARY ARTERY DISEASE (ICD-414.00)  nuclear.... 2008..... question slight anteroapical ischemia EF  probably normal... echo... technically difficult... 2004  /  EF 70%... nuclear... July, 2007 CABG...2003.Marland KitchenMarland KitchenSVG to LAD.Marland KitchenMarland KitchenLIMA not used-small calliber Subclavian stenosis....PCI .(Chales Abrahams) Carotid artery disease....minimal by doppler in past Aspirin intolerance...  Carotid artery disease   Doppler... December, 2007.... bilateral 0-39% PERIPHERAL VASCULAR DISEASE (ICD-443.9) HYPERLIPIDEMIA (ICD-272.4) DIABETES MELLITUS, MILD (ICD-250.00) GERD (ICD-530.81) IBS (ICD-564.1) Hx of NEPHROLITHIASIS (ICD-592.0) DEGENERATIVE JOINT DISEASE (ICD-715.90) KNEE INJURY, RIGHT (ICD-959.7) OSTEOPOROSIS (ICD-733.00) SENILE DEMENTIA WITH DELUSIONAL FEATURES (ICD-290.20) ANXIETY (ICD-300.00) Palpitations SOB Hiatal Hernia Esophageal Stricture Hemorrhoids Hypertension Obesity 3 heart attacks  Review of Systems       Patient denies fever, chills, headache, sweats, rash, change in vision, change in hearing, chest pain, cough, nausea vomiting, urinary symptoms.  All other systems are reviewed and are negative.  Vital Signs:  Patient profile:   77 year old female Height:      62 inches Weight:  168 pounds BMI:     30.84 Pulse rate:   73 / minute BP sitting:   108 / 68  (left arm) Cuff size:   regular  Vitals Entered By: Hardin Negus, RMA (July 06, 2010 2:01 PM)  Physical Exam  General:  patient is overweight but stable. Eyes:  no xanthelasma. Neck:  no jugular venous distention. Lungs:   lungs are clear.  Respiratory effort is nonlabored. Heart:  cardiac exam reveals S1-S2.  No clicks or significant murmurs. Abdomen:  abdomen is soft. Extremities:  no peripheral edema. Psych:  patient is oriented to person time and place.  Affect is normal.   Impression & Recommendations:  Problem # 1:  PALPITATIONS, HX OF (ICD-V12.50) The patient is not having any significant palpitations.  Problem # 2:  CORONARY ARTERY DISEASE (ICD-414.00)  Her updated medication list for this problem includes:    Nitrostat 0.4 Mg Subl (Nitroglycerin) .Marland Kitchen... Place 1 tab under the tongue for chest pain... as needed    Metoprolol Tartrate 25 Mg Tabs (Metoprolol tartrate) .Marland Kitchen... Take 1 tab by mouth two times a day...    Plavix 75 Mg Tabs (Clopidogrel bisulfate) .Marland Kitchen... 1 by mouth once daily Coronary disease is stable.  EKG is done today and reviewed by me.  There is no significant change.  No further workup is needed.  Problem # 3:  HYPERTENSION (ICD-401.9)  Her updated medication list for this problem includes:    Metoprolol Tartrate 25 Mg Tabs (Metoprolol tartrate) .Marland Kitchen... Take 1 tab by mouth two times a day...    Losartan Potassium 50 Mg Tabs (Losartan potassium) .Marland Kitchen... Take 1 tablet by mouth once a day    Bumetanide 0.5 Mg Tabs (Bumetanide) .Marland Kitchen... Take 1 tablet by mouth once a day    Caduet 5-20 Mg Tabs (Amlodipine-atorvastatin) .Marland Kitchen... Take 1 tab by mouth once daily... Blood pressure is controlled.  No change in therapy.  Other Orders: EKG w/ Interpretation (93000)  Patient Instructions: 1)  Your physician recommends that you continue on your current medications as directed. Please refer to the Current Medication list given to you today. 2)  Your physician wants you to follow-up in:  1 year. You will receive a reminder letter in the mail two months in advance. If you don't receive a letter, please call our office to schedule the follow-up appointment. Prescriptions: PLAVIX 75 MG TABS (CLOPIDOGREL  BISULFATE) 1 by mouth once daily  #30 x 11   Entered by:   Claris Gladden RN   Authorized by:   Talitha Givens, MD, [facility]   Signed by:   Claris Gladden RN on 07/06/2010   Method used:   Electronically to        [facility]  [phone]* (retail)       [address] [place], [place]  [address]       Ph: [phone] or [phone]       Fax: [phone]   RxID:   [id number] BUMETANIDE 0.5 MG  TABS (BUMETANIDE) Take 1 tablet by mouth once a day  #30 Tablet x 11   Entered by:   Claris Gladden RN   Authorized by:   Talitha Givens, MD, [facility]   Signed by:   Claris Gladden RN on 07/06/2010   Method used:   Electronically to        [facility]  [phone]* (retail)       [address]  [place], [place]  [address]       Ph: [phone] or [phone]       Fax: [phone]   RxID:   [id number] CADUET 5-20 MG  TABS (AMLODIPINE-ATORVASTATIN) take 1 tab by mouth once daily...  #30 Tablet x 11   Entered by:   Claris Gladden RN   Authorized by:   Talitha Givens, MD, [facility]   Signed by:   Claris Gladden RN on 07/06/2010   Method used:   Electronically to        [facility]  [phone]* (retail)       [address] [place], [place]  [address]       Ph: [phone] or [phone]       Fax: [phone]   RxID:   [id number] LOSARTAN POTASSIUM 50 MG TABS (LOSARTAN POTASSIUM) Take 1 tablet by mouth once a day  #30 x 11   Entered by:   Claris Gladden RN   Authorized by:   Talitha Givens, MD, [facility]   Signed by:   Claris Gladden RN on 07/06/2010   Method used:   Electronically to        [facility]  [phone]* (retail)       [address] [place], [place]  [address]       Ph: [phone] or [phone]       Fax: [phone]   RxID:   [id number] METOPROLOL TARTRATE 25 MG  TABS (METOPROLOL TARTRATE) take 1 tab by mouth two times a day...  #60 Tablet x 11   Entered by:   Claris Gladden RN   Authorized by:   Talitha Givens, MD, [facility]   Signed by:   Claris Gladden RN on 07/06/2010   Method used:   Electronically to        [facility]  [phone]* (retail)       [address] [place], [place]  [address]       Ph: [phone] or [phone]       Fax: [phone]   RxID:   [id number] NITROSTAT 0.4 MG  SUBL (NITROGLYCERIN) place 1 tab under the tongue for chest pain... as needed  #25 x 4   Entered by:   Claris Gladden RN   Authorized by:   Talitha Givens, MD, [facility]   Signed by:   Claris Gladden RN on 07/06/2010   Method used:   Electronically to        [facility]  [phone]* (retail)       [address] [place], [place]  [address]       Ph: [phone] or [phone]       Fax: [phone]   RxID:   [id number]

## 2010-12-07 NOTE — Letter (Signed)
Summary: EGD Instructions  [facility]  [address] [place], [place] [address]   Phone: [phone]  Fax: [phone]       [name]    February 22, 1929    MRN: [id number]       Procedure Day /Date:TUESDAY, 04/04/10     Arrival Time: 1:30 PM      Procedure Time:2:30 PM     Location of Procedure:                    [facility] (4th [facility])    PREPARATION FOR ENDOSCOPY WITH POSS. DILT.   On TUESDAY 04/04/10 THE DAY OF THE PROCEDURE:  1.   No solid foods, milk or milk products are allowed after midnight the night before your procedure.  2.   Do not drink anything colored red or purple.  Avoid juices with pulp.  No orange juice.  3.  You may drink clear liquids until12:30 PM, which is 2 hours before your procedure.                                                                                                CLEAR LIQUIDS INCLUDE: Water Jello Ice Popsicles Tea (sugar ok, no milk/cream) Powdered fruit flavored drinks Coffee (sugar ok, no milk/cream) Gatorade Juice: apple, white grape, white cranberry  Lemonade Clear bullion, consomm, broth Carbonated beverages (any kind) Strained chicken noodle soup Hard Candy   MEDICATION INSTRUCTIONS  Unless otherwise instructed, you should take regular prescription medications with a small sip of water as early as possible the morning of your procedure.             OTHER INSTRUCTIONS  You will need a responsible adult at least 75 years of age to accompany you and drive you home.   This person must remain in the waiting room during your procedure.  Wear loose fitting clothing that is easily removed.  Leave jewelry and other valuables at home.  However, you may wish to bring a book to read or an iPod/MP3 player to listen to music as you wait for your procedure to start.  Remove all body piercing jewelry and leave at home.  Total time from sign-in until discharge is approximately 2-3  hours.  You should go home directly after your procedure and rest.  You can resume normal activities the day after your procedure.  The day of your procedure you should not:   Drive   Make legal decisions   Operate machinery   Drink alcohol   Return to work  You will receive specific instructions about eating, activities and medications before you leave.    The above instructions have been reviewed and explained to me by   _______________________    I fully understand and can verbalize these instructions _____________________________ Date _________

## 2010-12-07 NOTE — Medication Information (Signed)
Summary: Tax adviser   Imported By: Lehman Prom 01/18/2010 13:44:30  _____________________________________________________________________  External Attachment:    Type:   Image     Comment:   External Document

## 2010-12-07 NOTE — Procedures (Signed)
Summary: Soil scientist   Imported By: Sherian Rein 03/02/2010 09:34:24  _____________________________________________________________________  External Attachment:    Type:   Image     Comment:   External Document

## 2010-12-07 NOTE — Progress Notes (Signed)
Summary: talk to nurse  Phone Note Call from Patient Call back at Home Phone [phone]   Caller: Patient Call For: nadel Reason for Call: Talk to Nurse Summary of Call: Pt said she fell and hurt her back on 3/9 wants to be seen next week, I offered her an appt with TP but she want to see SN only, pls advise. Initial call taken by: Darletta Moll,  January 13, 2010 10:50 AM  Follow-up for Phone Call        Please advise a day for pt to come in and be seen by SN. Thanks. Zackery Barefoot CMA  January 13, 2010 11:16 AM   called and spoke with pt and she will come in on monday at 3:30  on 3-14 Randell Loop CMA  January 13, 2010 1:13 PM

## 2010-12-07 NOTE — Progress Notes (Signed)
Summary: sick  Phone Note Call from Patient Call back at Home Phone [phone]   Caller: Patient Call For: nadel Reason for Call: Talk to Nurse Summary of Call: pt sick, vomiting, bile is what is coming up, unable to eat, hard to swallow, drinking water ok.  Please advise Initial call taken by: Eugene Gavia,  February 27, 2010 8:46 AM  Follow-up for Phone Call        Pt c/o vomitting "on and off" x 2 weeks. She denies fever, diarrhea, blood in her stools. She states she is only able to drink water, unable to eat solid foods. She also c/o x 2 months having some difficulty swallowing food at times when she is eating. She states she has to eat small amounts or she feels like she will choke. Sh ewanted to know if this could be contributing to the vomiting. Please advise. Carron Curie CMA  February 27, 2010 9:34 AM  [facility]  Additional Follow-up for Phone Call Additional follow up Details #1::        per SN----she will need ASAP appt with GI for this.  order has been sent already  thanks Randell Loop CMA  February 27, 2010 10:55 AM   pt aware pcc will be calling her with gi appt Additional Follow-up by: Philipp Deputy CMA,  February 27, 2010 11:01 AM  New Problems: VOMITING ALONE (ICD-787.03)   New Problems: VOMITING ALONE (ICD-787.03)

## 2010-12-07 NOTE — Procedures (Signed)
Summary: Upper Endoscopy  Patient: [name] Note: All result statuses are Final unless otherwise noted.  Tests: (1) Upper Endoscopy (EGD)   EGD Upper Endoscopy       DONE     [facility]     [address]     [place], [place]  [address]           ENDOSCOPY PROCEDURE REPORT           PATIENT:  [name], [name]  MR#:  [id number]     BIRTHDATE:  26-Feb-1929, 81 yrs. old  GENDER:  female           ENDOSCOPIST:  Wilhemina Bonito. Eda Keys, MD     Referred by:  Office           PROCEDURE DATE:  04/04/2010     PROCEDURE:  EGD with biopsy     ASA CLASS:  Class III     INDICATIONS:  dysphagia           MEDICATIONS:   Fentanyl 50 mcg IV, Versed 5 mg IV     TOPICAL ANESTHETIC:  Exactacain Spray           DESCRIPTION OF PROCEDURE:   After the risks benefits and     alternatives of the procedure were thoroughly explained, informed     consent was obtained.  The LB [id number] endoscope was     introduced through the mouth and advanced to the third portion of     the duodenum, without limitations.  The instrument was slowly     withdrawn as the mucosa was fully examined.     <<PROCEDUREIMAGES>>           Esophagitis (edema and ulceration) was found in the distal     esophagus.  Mild gastritis (erythema and a few tiny erosions) was     found in the antrum.The stomach was otherwise normal. CLO bx     taken.  normal duodenal bulb and descending duodenum.     Retroflexed views revealed a hiatal hernia.    The scope was then     withdrawn from the patient and the procedure completed.           COMPLICATIONS:  None           ENDOSCOPIC IMPRESSION:     1) Ulcerative Esophagitis in the distal esophagus     2) Mild gastritis in the antrum     3) Otherwise normal examination           RECOMMENDATIONS:     1) INCREASE OMEPRAZOLE TO 20MG  TWICE DAILY     2) REFLUX PRECAUTIONS     3) GI FOLLOW UP AS NEEDED           ______________________________     Wilhemina Bonito. Eda Keys, MD        CC:  Michele Mcalpine, MD, The Patient           n.     eSIGNEDWilhemina Bonito. Eda Keys at 04/04/2010 02:46 PM           [name], [id number]  Note: An exclamation mark (!) indicates a result that was not dispersed into the flowsheet. Document Creation Date: 04/04/2010 2:47 PM _______________________________________________________________________  (1) Order result status: Final Collection or observation date-time: 04/04/2010 14:39 Requested date-time:  Receipt date-time:  Reported date-time:  Referring Physician:   Ordering Physician: Fransico Setters [phone]) Specimen Source:  Source:  [name] Order Number: [id number] Lab site:

## 2010-12-07 NOTE — Miscellaneous (Signed)
  Clinical Lists Changes  Observations: Added new observation of PAST MED HX: Hx of BRONCHITIS, RECURRENT (ICD-491.9) ABNORMAL CHEST XRAY (ICD-793.1) Hx of PULMONARY EMBOLISM (ICD-415.19) Coumadin Rx stopped 11/09 hosp w/ large hematoma from fall HYPERTENSION (ICD-401.9) CORONARY ARTERY DISEASE (ICD-414.00)  nuclear.... 2008..... question slight anteroapical ischemia EF  probably normal... echo... technically difficult... 2004 CABG...2003.Marland KitchenMarland KitchenSVG to LAD.Marland KitchenMarland KitchenLIMA not used-small calliber Subclavian stenosis....PCI .(Chales Abrahams) Carotid artery disease....minimal by doppler in past Aspirin intolerance...  Carotid artery disease   Doppler... December, 2007.... bilateral 0-39% PERIPHERAL VASCULAR DISEASE (ICD-443.9) HYPERLIPIDEMIA (ICD-272.4) DIABETES MELLITUS, MILD (ICD-250.00) GERD (ICD-530.81) IBS (ICD-564.1) Hx of NEPHROLITHIASIS (ICD-592.0) DEGENERATIVE JOINT DISEASE (ICD-715.90) KNEE INJURY, RIGHT (ICD-959.7) OSTEOPOROSIS (ICD-733.00) SENILE DEMENTIA WITH DELUSIONAL FEATURES (ICD-290.20) ANXIETY (ICD-300.00) Palpitations SOB Hiatal Hernia Esophageal Stricture Hemorrhoids Hypertension Obesity 3 heart attacks (07/05/2010 13:46) Added new observation of REFERRING MD: n/a (07/05/2010 13:46) Added new observation of PRIMARY MD: Chauncey Mann MD (07/05/2010 13:46)       Past History:  Past Medical History: Hx of BRONCHITIS, RECURRENT (ICD-491.9) ABNORMAL CHEST XRAY (ICD-793.1) Hx of PULMONARY EMBOLISM (ICD-415.19) Coumadin Rx stopped 11/09 hosp w/ large hematoma from fall HYPERTENSION (ICD-401.9) CORONARY ARTERY DISEASE (ICD-414.00)  nuclear.... 2008..... question slight anteroapical ischemia EF  probably normal... echo... technically difficult... 2004 CABG...2003.Marland KitchenMarland KitchenSVG to LAD.Marland KitchenMarland KitchenLIMA not used-small calliber Subclavian stenosis....PCI .(Chales Abrahams) Carotid artery disease....minimal by doppler in past Aspirin intolerance...  Carotid artery disease   Doppler... December, 2007....  bilateral 0-39% PERIPHERAL VASCULAR DISEASE (ICD-443.9) HYPERLIPIDEMIA (ICD-272.4) DIABETES MELLITUS, MILD (ICD-250.00) GERD (ICD-530.81) IBS (ICD-564.1) Hx of NEPHROLITHIASIS (ICD-592.0) DEGENERATIVE JOINT DISEASE (ICD-715.90) KNEE INJURY, RIGHT (ICD-959.7) OSTEOPOROSIS (ICD-733.00) SENILE DEMENTIA WITH DELUSIONAL FEATURES (ICD-290.20) ANXIETY (ICD-300.00) Palpitations SOB Hiatal Hernia Esophageal Stricture Hemorrhoids Hypertension Obesity 3 heart attacks

## 2010-12-07 NOTE — Assessment & Plan Note (Signed)
Summary: dyspagia/vomiting    History of Present Illness Visit Type: Initial Visit Primary Provider: Chauncey Mann MD Requesting Provider: n/a Chief Complaint: GERD with regurgitation History of Present Illness:   77 year old white female with multiple medical problems including, but not limited to, hypertension, coronary artery disease with prior myocardial infarction and CABG, cerebrovascular disease, senile dementia, diabetes mellitus, hyperlipidemia, GERD, peptic stricture, peripheral vascular disease, and remote pulmonary embolus ,and asthmatic bronchitis. She is sent today by Dr. Kriste Basque regarding "vomiting" and dysphagia. The patient is a poor historian and her reliability is questionable. She did undergo upper endoscopy with esophageal dilation in July 2007. At that time, a large-caliber peptic stricture and small little hernia. 4 GERD she has been on omeprazole daily. On medication no pyrosis. She does report intermittent episodes of regurgitation WITH OUT actual vomiting. When asked about the frequency, she states this has happened 4 times since 2007 and most recently 10 days ago. She also reports intermittent dysphagia to liquids and solids equally. Not all the time. She cannot under a circumstance quantify this for me. Prior colonoscopy in 1998 was remarkable for mild diverticulosis only.Marland Kitchen Her multiple chronic medical problems are stable. She apparently had been on Plavix but states she has not been on this drug for weeks. She is brought today by her brother.   GI Review of Systems    Reports abdominal pain, acid reflux, bloating, chest pain, dysphagia with liquids, dysphagia with solids, heartburn, loss of appetite, nausea, vomiting, and  weight loss.      Denies belching, vomiting blood, and  weight gain.      Reports constipation, diverticulosis, hemorrhoids, and  rectal bleeding.     Denies anal fissure, black tarry stools, change in bowel habit, diarrhea, fecal incontinence, heme  positive stool, irritable bowel syndrome, jaundice, light color stool, liver problems, and  rectal pain. Preventive Screening-Counseling & Management      Drug Use:  no.      Current Medications (verified): 1)  Nitrostat 0.4 Mg  Subl (Nitroglycerin) .... Place 1 Tab Under The Tongue For Chest Pain... As Needed 2)  Metoprolol Tartrate 25 Mg  Tabs (Metoprolol Tartrate) .... Take 1 Tab By Mouth Two Times A Day... 3)  Losartan Potassium 50 Mg Tabs (Losartan Potassium) .... Take 1 Tablet By Mouth Once A Day 4)  Bumetanide 0.5 Mg  Tabs (Bumetanide) .... Take 1 Tablet By Mouth Once A Day 5)  Caduet 5-20 Mg  Tabs (Amlodipine-Atorvastatin) .... Take 1 Tab By Mouth Once Daily.Marland KitchenMarland Kitchen 6)  Metformin Hcl 500 Mg  Tabs (Metformin Hcl) .... Take One Tablet By Mouth Once Daily 7)  Glimepiride 1 Mg  Tabs (Glimepiride) .... Take 1/2 Tablet By Mouth Every Morning 8)  Omeprazole 20 Mg  Cpdr (Omeprazole) .... Take 1 Tab By Mouth Once Daily.Marland KitchenMarland Kitchen 9)  Boniva 150 Mg Tabs (Ibandronate Sodium) .... Take 1 Tab By Mouth Each Month... 10)  Caltrate 600+d 600-400 Mg-Unit Tabs (Calcium Carbonate-Vitamin D) .... Take 1 Tab By Mouth Two Times A Day... 11)  Multivitamins  Tabs (Multiple Vitamin) .... Take 1 Tab By Mouth Once Daily.Marland Kitchenas Needed 12)  Vitamin D3 1000 Unit Caps (Cholecalciferol) .... Take 1 Cap Daily... 13)  Clorazepate Dipotassium 7.5 Mg  Tabs (Clorazepate Dipotassium) .... Take 1/2 To 1 Tablet By Mouth Three Times A Day As Needed For Anxiety... 14)  Tramadol Hcl 50 Mg Tabs (Tramadol Hcl) .... Take 1 Tab By Mouth Every 6h As Needed For Pain 15)  Lidoderm 5 % Ptch (Lidocaine) .Marland KitchenMarland KitchenMarland Kitchen  Apply One Patch To The Painful Area- On For 12h, Then Remove For 12h, and Repeat As Needed. 16)  Plavix 75 Mg Tabs (Clopidogrel Bisulfate) .... Pt Has Been Out For 2-3 Weeks  1 By Mouth Once Daily  Allergies (verified): 1)  Asa 2)  * Avalide 3)  * Toprol 4)  Pcn 5)  Iodine  Past History:  Past Medical History: Hx of BRONCHITIS, RECURRENT  (ICD-491.9) ABNORMAL CHEST XRAY (ICD-793.1) Hx of PULMONARY EMBOLISM (ICD-415.19) Coumadin Rx stopped 11/09 hosp w/ large hematoma from fall HYPERTENSION (ICD-401.9) CORONARY ARTERY DISEASE (ICD-414.00) CABG...2003.Marland KitchenMarland KitchenSVG to LAD.Marland KitchenMarland KitchenLIMA not used-small calliber Subclavian stenosis....PCI .(Chales Abrahams) Carotid artery disease....minimal by doppler in past Aspirin intolerance...  CEREBROVASCULAR DISEASE (ICD-437.9) PERIPHERAL VASCULAR DISEASE (ICD-443.9) HYPERLIPIDEMIA (ICD-272.4) DIABETES MELLITUS, MILD (ICD-250.00) GERD (ICD-530.81) IBS (ICD-564.1) Hx of NEPHROLITHIASIS (ICD-592.0) DEGENERATIVE JOINT DISEASE (ICD-715.90) KNEE INJURY, RIGHT (ICD-959.7) OSTEOPOROSIS (ICD-733.00) SENILE DEMENTIA WITH DELUSIONAL FEATURES (ICD-290.20) ANXIETY (ICD-300.00) Palpitations SOB Hiatal Hernia Esophageal Stricture Hemorrhoids Hypertension Obesity 3 heart attacks  Past Surgical History:  Appendectomy Cataract extraction Hemorrhoidectomy Hysterectomy S/P 1 vessel CABG 2003 w/ SVG to LAD S/P mult orthopedic procedures from DrWeingold: fx L arm, CTS, cyst removal, etc. open heart surgery   Family History: Father died age 60 from stroke Mother died age 80 w/ bladder cancer, hx HBP & stroke. 10 Siblings: 5 passed w/ "old age" & no known other problems 1 Sis passed w/ leukemia Brother, Vernia Buff, looks in on her frequently. No FH of Colon Cancer:  Social History: Reviewed history from 08/30/2009 and no changes required. Widow 1 Child, daughter, lives in [place]. never smoked retired from [employer] Alcohol Use - no Daily Caffeine Use Illicit Drug Use - no  Review of Systems       The patient complains of anxiety-new, back pain, sleeping problems, swelling of feet/legs, and urination - excessive.  The patient denies allergy/sinus, anemia, arthritis/joint pain, blood in urine, breast changes/lumps, change in vision, confusion, cough, coughing up blood, depression-new, fainting, fatigue,  fever, headaches-new, hearing problems, heart murmur, heart rhythm changes, itching, menstrual pain, muscle pains/cramps, night sweats, nosebleeds, pregnancy symptoms, shortness of breath, skin rash, sore throat, swollen lymph glands, thirst - excessive , urination - excessive , urination changes/pain, urine leakage, vision changes, and voice change.    Vital Signs:  Patient profile:   77 year old female Height:      62 inches Weight:      167 pounds BMI:     30.66 BSA:     1.77 Pulse rate:   80 / minute Pulse rhythm:   regular BP sitting:   92 / 52  (left arm)  Vitals Entered By: Merri Ray CMA Duncan Dull) (March 01, 2010 3:55 PM)  Physical Exam  General:  Well developed, well nourished, no acute distress. Head:  Normocephalic and atraumatic. Eyes:  no jaundice Nose:  No deformity, discharge,  or lesions. Mouth:  No deformity or lesions Neck:  Supple; no masses or thyromegaly. Lungs:  Clear throughout to auscultation. Heart:  Regular rate and rhythm; no murmurs, rubs,  or bruits. Abdomen:  Soft,obese, nontender and nondistended. No masses, hepatosplenomegaly or hernias noted. Normal bowel sounds. Msk:  Symmetrical with no gross deformities. Some osteoarthritic changes Normal posture. Pulses:  Normal pulses noted. Extremities:  No clubbing, cyanosis, edema or deformities noted. Neurologic:  Alert and  oriented x4;   Skin:  Intact without significant lesions or rashes. Cervical Nodes:  No significant cervical adenopathy.no supraclavicular adenopathy Psych:  Alert and cooperative. Normal mood and affect.  Impression & Recommendations:  Problem # 1:  DYSPHAGIA UNSPECIFIED (ICD-787.20) The patient has a history of GERD complicated by peptic stricture for which he has undergone esophageal dilation remotely. now reporting what seems to be intermittent solid and liquid dysphagia. Severity is uncertain. Maybe due to recurrent esophageal stricture. Possibly dysmotility.  Plan: #1.  Upper endoscopy with possible esophageal dilation. The patient is HIGH RISK due to her age and multiple comorbidities. The nature procedure as well as risks, benefits, and alternatives were reviewed. She understood and agreed to proceed. She will remain off Plavix for this exam. Her brother was in the room to receive instructions and help to assure no confusion #2. Continue omeprazole  Problem # 2:  GERD (ICD-530.81) GERD with what sounds like rare regurgitation  Plan #1. Reflux precautions to be strictly adhered to #2. Continue omeprazole  Problem # 3:  ESOPHAGEAL STRICTURE (ICD-530.3) possible cause for recurrent dysphagia. Please see #1 above  Problem # 4:  regurgitation. no true vomiting  Other Orders: EGD (EGD)  Patient Instructions: 1)  EGD [facility] with possible [place]. 04/04/10 2:30 pm. 2)  Upper Endoscopy brochure given.  3)  Upper Endoscopy with Dilatation brochure given.  4)  The medication list was reviewed and reconciled.  All changed / newly prescribed medications were explained.  A complete medication list was provided to the patient / caregiver. 5)  printed and given to patient. 6)  Milford Cage [facility]  March 01, 2010 4:30 PM 7)  copy: Dr. Alroy Dust

## 2010-12-07 NOTE — Letter (Signed)
Summary: EGD/[facility]  EGD/[facility]   Imported By: Sherian Rein 03/02/2010 09:37:44  _____________________________________________________________________  External Attachment:    Type:   Image     Comment:   External Document

## 2010-12-07 NOTE — Procedures (Signed)
Summary: EGD/[facility]  EGD/[facility]   Imported By: Sherian Rein 03/02/2010 09:36:05  _____________________________________________________________________  External Attachment:    Type:   Image     Comment:   External Document

## 2010-12-07 NOTE — Progress Notes (Signed)
Summary: meds  Phone Note Call from Patient Call back at Home Phone [phone]   Caller: Patient Call For: [name] Reason for Call: Talk to Nurse Summary of Call: pt needs nurse to call and verify that she does need to continue to take her meds. Initial call taken by: Eugene Gavia,  January 20, 2010 11:01 AM  Follow-up for Phone Call        pt wanted to verify that she was to cont taking her daily meds along with the tramadol as needed for pain and abx for UTI. I advised she needs to cont to take all of her daily meds as well. Carron Curie CMA  January 20, 2010 11:15 AM

## 2010-12-07 NOTE — Miscellaneous (Signed)
Summary: CLOTEST  Clinical Lists Changes  Orders: Added new Test order of TLB-H Pylori Screen Gastric Biopsy (83013-CLOTEST) - Signed 
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
## 2010-12-07 NOTE — Progress Notes (Signed)
Summary: triage   Phone Note From Other Clinic Call back at x [phone]   Caller: Adena from Dr Kriste Basque Call For: Marina Goodell Reason for Call: Schedule Patient Appt Summary of Call: Dr Kriste Basque would like this patient seen before first availabel 5-24 do to vomitting bile and dysphagia. Initial call taken by: Tawni Levy,  February 27, 2010 11:15 AM  Follow-up for Phone Call        Given appt. for Wed. 4/27 at 4pm. Adena will notify pt. Follow-up by: Teryl Lucy RN,  February 27, 2010 11:40 AM

## 2011-03-20 NOTE — Assessment & Plan Note (Signed)
[facility]   NAMEGRAYSON, WHITE               ACCOUNT NO.:  [id number]   MEDICAL RECORD NO.:  [id number]      DATE OF BIRTH:  February 28, 1929   PHYSICIAN:  Leonie Man, M.D.         VISIT DATE:                                   OFFICE VISIT   PROBLEM:  Nonhealing right knee ulcer.   HISTORY:  The patient is a 77 year old female who fell in November 2009,  and developed a large subcutaneous hematoma under the anteromedial  aspect of her right knee, which eventually ulcerated.  She has been  undergoing debridement and treatments with wet-to-dry dressings [facility].   On examination today, the patient's vital signs are temperature 97.6,  pulse 62, respirations 18, blood pressure 119/77, blood glucose today  was measured at 76.  She is feeling well without any complaints.  She  has been followed by the visiting nurses from [facility] for 3  times a week daily dressings.   On examination, the right lower extremity is warm, and the interdigital  spaces are clear.  The wound itself is healing satisfactorily with 100%  clean granulating base with just some very small areas of fibrinous  exudate.  The wound still measures 1 cm in depth, but there is no  significant undermining anymore.  The width of the wound was 2 cm and  length was 1-cm.   Today's treatment, the wound is debrided of some very small amounts of  exudate.  I used a damp-to-dry saline dressing to pack the wound  loosely, cover this with a bulky dressing, and we will have her follow  up with the nurses 3 times weekly and over the next week, I will see her  in 1 week from now to assess her wounds.      Leonie Man, M.D.  Electronically Signed     PB/MEDQ  D:  01/04/2009  T:  01/05/2009  Job:  [id number]

## 2011-03-20 NOTE — Assessment & Plan Note (Signed)
[facility]   NAMETANAYIA, WAHLQUIST               ACCOUNT NO.:  [id number]   MEDICAL RECORD NO.:  [id number]      DATE OF BIRTH:  Jan 07, 1929   PHYSICIAN:  Jonelle Sports. Sevier, M.D.  VISIT DATE:  01/25/2009                                   OFFICE VISIT   HISTORY:  This 77 year old white female has been followed for several  months for the management of the huge hematoma on the prepatellar aspect  of the left knee sustained in a fall back around Thanksgiving.  This has  reached the point of very near healing and at her last visit here, she  was changed from more aggressive treatment simply to daily cleansing  with soapy water, rinsing it well with saline, and then applying a dry  gauze.   She has had no concerns and no longer uses home health care and feels  that she is doing satisfactory.   PHYSICAL EXAMINATION:  Blood pressure 135/81, pulse 67, respirations 20,  and temperature 98.1.  The wound measures 0.3 x 0.6 x 0.3 cm in depth  and there is at the lateral margin of this wound, a discolored area  which may represent excursion of a tiny foreign body or possibly just  some crust formation.   IMPRESSION:  Continued progress, right knee wound.   DISPOSITION:  The small area of concern is debrided with the use of  forceps to remove the questionable foreign body and also small other  amount of slough and fibrous material in the wound base.  The wound is  left looking granular and healthy.   It will be dressed today with an application of neosporin and dry  dressing and the patient will be instructed to continue her daily  cleansing and at home to use neosporin and a dry dressing as we have  done here.   Followup visit will be in 2 weeks with the anticipation that she will be  able to be released at that time.           ______________________________  Jonelle Sports Cheryll Cockayne, M.D.     RES/MEDQ  D:  01/25/2009  T:  01/26/2009  Job:  [id number]

## 2011-03-20 NOTE — Assessment & Plan Note (Signed)
[facility]                            CARDIOLOGY OFFICE NOTE   NAME:[name], [name] PANAMENO                      MRN:          [id number]  DATE:05/06/2007                            DOB:          [birth year]    [name] was seen for cardiology followup.  She is followed carefully  by Dr. Kriste Basque.  She had 1 brief episode of chest pain approximately 3  months ago.  She has not had any other.  She is not having any marked  shortness of breath.  She fell in the parking lot at [facility].  She  did have injuries, but she is stabilized.  This was not syncope.  She is  no longer in the cardiac rehab program.  She does have documented  coronary disease.  She has been relatively stable.  The patient is on  Coumadin for other reasons.  She cannot take aspirin because she has  severe GI difficulties with it.   PAST MEDICAL HISTORY:   ALLERGIES:  Significant GI symptoms from ASPIRIN.  Also PENICILLIN,  AVALIDE, TOPROL, and question LODINE.   MEDICATIONS:  1. Avapro 150.  2. Caduet 5/20.  3. Omeprazole 20.  4. Metformin 500.  5. Bumetanide 0.5.  6. Coumadin as directed.  7. Xanax.  8. Boniva.  9. Glimepiride.   OTHER MEDICAL PROBLEMS:  See the complete list below.   REVIEW OF SYSTEMS:  Medically, she actually is doing well.  She has many  family stress issues.  Otherwise, however, her review of systems today  is negative.  She had cataract surgery on both eyes.  She is doing well  with the right eye, and the left eye is not doing as well.   PHYSICAL EXAMINATION:  Her weight is 174.  Blood pressure is 128/70.  Her pulse is 75.  The patient is oriented to person, time, and place.  Affect is normal.  HEENT:  Reveals no xanthelasma.  She has normal extraocular  motion.  There are no carotid bruits.  There is no jugular venous distension.  LUNGS:  Clear.  Respiratory effort is not labored.  CARDIAC EXAM:  Reveals an S1 with an S2.  There are no clicks or  significant murmurs.  The abdomen is obese but soft.  She has normal bowel sounds.  She has no significant peripheral edema.   No labs are done today.   PROBLEM LIST:  1. Coronary disease.  She is post coronary artery bypass grafting in      October of 2003.  She has had a catheterization done since then      that showed that her vein graft to the LAD was widely patent.      There was some disease at the septal perforators.  It is of note      that her mammary could not be used at the time of the surgery      because it was small caliber.  2. History of subclavian stenosis treated with percutaneous coronary      intervention by Dr. Chales Abrahams in the past.  3. History  of some neck discomfort, stable.  4. Mild carotid disease.  She had a recent Doppler within the past      year and it showed only minimal disease.  5. History of hypertension.  6. Asthmatic bronchitis.  7. History of hiatal hernia and gastroesophageal reflux disease.  8. Irritable bowel.  9. Degenerative joint disease.  10.History of some anxiety.  11.Status post cholecystectomy, hysterectomy, hemorrhoid surgery and      appendectomy.  12.Hyperlipidemia, treated.  13.Obesity, she knows she needs to lose weight.  14.Volume status.  Her volume status is stable at this time.  15.History of significant pulmonary emboli in 2002, and she will      remain on long-term Coumadin.  16.Long-term Coumadin therapy.  17.Status post fracture of both arms when she fell (no syncope).  18.Inability to tolerate aspirin.   Her cardiac status is stable.  I will see her back in 6 months.     Luis Abed, MD, [facility]  Electronically Signed    JDK/MedQ  DD: 05/06/2007  DT: 05/06/2007  Job #: [id number]
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
## 2011-03-20 NOTE — Assessment & Plan Note (Signed)
[facility]   NAMEINIS, BORNEMAN               ACCOUNT NO.:  [id number]   MEDICAL RECORD NO.:  [id number]      DATE OF BIRTH:  1928/12/09   PHYSICIAN:  Leonie Man, M.D.    VISIT DATE:  01/19/2009                                   OFFICE VISIT   PROBLEM:  Nonhealing wound of the right knee.   HISTORY:  The patient is a 77 year old female who fell during  Thanksgiving holiday in 2009, and developed a large subcutaneous  hematoma, which deteriorated into a nonhealing ulcer of her knee.   Current Rx:  She has been undergoing debridement and damp-to-dry saline dressings.   EXAM/  On today's evaluation, the patient is afebrile with stable vital signs.  Her knee looks really quite good today.  There is absolutely no  fibrinous exudate.  The wound size is now decreased to 0.5 x 1.9 x 0.3.  The base of the wound is clean and granulating.  I will graduate her up  to daily showers with antibacterial soap and dry sterile dressings for  the next week.   DISP/  Bring her back in 1 week for reevaluation.      Leonie Man, M.D.  Electronically Signed     PB/MEDQ  D:  01/19/2009  T:  01/20/2009  Job:  [id number]

## 2011-03-20 NOTE — Discharge Summary (Signed)
NAMELAQUINTA, HAZELL               ACCOUNT NO.:  [id number]   MEDICAL RECORD NO.:  [id number]          PATIENT TYPE:  OBV   LOCATION:  2022                         FACILITY:  [facility]   PHYSICIAN:  Luis Abed, MD, FACCDATE OF BIRTH:  Dec 05, 1928   DATE OF ADMISSION:  05/14/2007  DATE OF DISCHARGE:  05/15/2007                         DISCHARGE SUMMARY - REFERRING   DISCHARGE DIAGNOSES:  1. Chest discomfort of uncertain etiology.  2. Dysphagia.  3. Hyperlipidemia.   HISTORY OF PRESENT ILLNESS:  Ms. Goodlin is a 77 year old, white female  who presented to [facility] with chest discomfort that  started on the morning of admission while at home.  She described it as  10/10, severe, sharp pain in the mid sternum with radiation to the left  chest associated with shortness of breath.  She took two sublingual  nitroglycerins with a slight improvement to an 8/10.  She feels the  discomfort is similar to her myocardial infarction in 2002.  The  discomfort was not reproducible with palpation or movement.  The patient  has also had some back discomfort secondary to, on the day prior to her  presentation, bending over quite of bit while shredding papers.   PAST MEDICAL HISTORY:  1. Hypertension.  2. Diabetes.  3. GERD.  4. Myocardial infarction.  5. Anxiety.  6. Bypass surgery x1 vessel in October 2002.  7. Left subclavian stenosis.  8. Kidney stones.  9. Asthmatic bronchitis.  10.Esophageal stricture.  11.Irritable bowel syndrome.  12.Hiatal hernia.  13.DJD.  14.Obesity.  15.Saphenous vein graft to the LAD in October 2002.  16.Echocardiogram in March 2007, showed EF of 65%.  Last      catheterization in 2003, was done secondary to abnormal Cardiolite      study.  She has also had stents in the remote past.   LABORATORY DATA AND X-RAY FINDINGS:  Chest x-ray on July 9, showed no  acute findings.   Admission H&H was 12 and 36.3, normal indices, platelets 183, WBCs  8.3.  PTT 37, PT 32.3, INR 2.7.  CK-MBs and troponins were within normal  limits x3.  Lipids showed a total cholesterol 137, triglycerides 200,  HDL 36, LDL 61.  On July 10, sodium was 140, potassium 3.6, BUN 10,  creatinine 0.66, glucose 106.  Normal LFTs.   HOSPITAL COURSE:  She was placed on IV heparin as well as her home  medications.  Overnight, she did not have any further symptoms.  Dr.  Myrtis Ser did not feel it was cardiac in etiology.  There was no evidence of  ischemia or injury.  She was also having some difficulty swallowing and  felt that she should be followed up as an outpatient with GI.  Dr. Myrtis Ser  also felt that there is a musculoskeletal component to her chest  discomfort and felt that she could be discharged home with outpatient  Myoview and follow up with him.   DISPOSITION:  The patient is discharged home.   DIET:  She was asked to maintain low-sodium, heart-healthy, ADA diet.   DISCHARGE MEDICATIONS:  1. Avapro 150.  2. Caduet 5/20.  3. Omeprazole 20.  4. Metformin 500 mg.  5. Bumex 0.5 mg.  6. Coumadin.  7. Xanax.  8. Boniva.  9. Glipizide 1 mg.  10.Nitroglycerin as needed.   FOLLOW UP:  She was asked to bring all medications to all appointments.  She will have an adenosine Myoview on May 23, 2007, at noon and a  followup appointment with Dr. Henrietta Hoover physician assistant on the day that  he is there on June 06, 2007, at 10:15 a.m. (first available).   Discharge time 45 minutes.      Joellyn Rued, PA-C      Luis Abed, MD, [facility]  Electronically Signed    EW/MEDQ  D:  05/15/2007  T:  05/15/2007  Job:  [id number]   cc:   Lonzo Cloud. Kriste Basque, MD

## 2011-03-20 NOTE — Assessment & Plan Note (Signed)
[facility]   [name], [name]               ACCOUNT NO.:  [id number]   MEDICAL RECORD NO.:  [id number]      DATE OF BIRTH:  Dec 21, 1928   PHYSICIAN:  Leonie Man, M.D.    VISIT DATE:  12/20/2008                                   OFFICE VISIT   REFERRING PHYSICIAN:  Dr. Farris Has   PRIMARY CARE PHYSICIAN:  Lonzo Cloud. Kriste Basque, MD   The patient is a 77 year old female who fell somewhere around  Thanksgiving having an injury to her anteromedial right knee which  resulted in a very large hematoma which then because of pressure of the  hematoma became an ulcer.  This has been an indolent wound since that  time and she is now referred for evaluation and treatment.  The patient  is not experiencing any significant pain.  She says whatever pain she  does have is relieved usually by Advil.  She does not note any odor and  there is no significant drainage from the wound.  The wound has been  treated by [facility] over the last few weeks with gentle  cleaning and a dressing on without any significant progress towards  healing.   The patient is indeed a diabetic with history of coronary artery  disease, status post failed stents and coronary artery bypass in the  remote past.  She also has undergone a total abdominal hysterectomy in  the remote past.   Her current medications are:  1. Coumadin 5 mg daily, I am unaware of what her current INR is.  2. Nitrostat 0.4 mg p.r.n.  3. Metoprolol 25 mg b.i.d.  4. Avapro 150 mg daily.  5. Bumetanide 0.5 mg daily.  6. Caduet 5/20 daily.  7. Metformin 500 mg daily.  8. Glimepiride 1 mg daily.  9. Omeprazole 20 mg daily.  10.Alendronate 70 mg weekly.  11.Vitamin D 1000 units daily.  12.Clorazepate 7.5 mg daily.  13.She takes Colace as needed for constipation.   She is allergic to PENICILLIN which causes rash.   The patient's social history is that she lives alone at home and she is  cared for by [facility] 3  times a week.  Next door to her is her  brother who takes care of most of her chores.  Otherwise, the patient is  a widow.  There is no history of tobacco, alcohol, or illicit drug use.   REVIEW OF SYSTEMS:  A 15-point review of systems is positive for  hypertension.  She is status post coronary heart attack in the remote  past.  She does give a history of blood clots, however, I could not find  any specific incidence of this.  Currently, she has no calf tenderness  and this may be the reason for her Coumadin.  She does complain of  gastroesophageal reflux and does have some problems swallowing.   The remainder of her review of systems is essentially negative.   PHYSICAL EXAMINATION:  GENERAL:  She is a bright well-developed, well-  nourished, and very pleasant white female.  VITAL SIGNS:  She is afebrile at 98.2, respirations are 18, pulse is 66,  and blood pressure is 140/84.  HEENT:  Head is normocephalic.  Her pupils are round  and regular.  There  is no scleral icterus.  She wears both upper and lower dentures, but  oropharynx is otherwise benign.  NECK:  Supple.  There is no thyromegaly.  There is no palpable cervical  adenopathy.  LUNGS:  Clear to auscultation bilaterally.  HEART:  Regular rate and rhythm without murmurs heard.  ABDOMEN:  Soft, nontender, and nondistended.  Bowel sounds are  normoactive and there is no palpable visceromegaly.  EXTREMITIES.  She has fairly good range of motion at the hips and knees.  On the right lower extremity, there is an open full-thickness ulcer  which measures at 1 cm x 1.5 cm, but tunnels in the 6 o'clock axis down  to 2.5 cm and in the 9 o'clock to 1 o'clock position, there is  undermining and tunneling of approximately 1-1.5 cm throughout.   Sharp full-thickness debridement carried out by excising large amounts  of necrosis and fibrinous exudate from the base of the wound.  I used a  curette to evacuate most of the debris at the 6  o'clock axis and to  irrigate this out with saline.  The patient tolerated this quite well.  For the next week, I am going to go ahead and just simply pack this with  a saline-dampened gauze, put some barrier cream around the wound edge to  protect from maceration and have her seen by Home Health with the same  dressing changes at least 3 times a week over the next week.  I also  gave her a prescription for doxycycline 100 mg b.i.d. and we will follow  up in 1 week.      Leonie Man, M.D.  Electronically Signed     PB/MEDQ  D:  12/20/2008  T:  12/21/2008  Job:  [id number]   cc:   Dr. Samuel Jester. Kriste Basque, MD

## 2011-03-20 NOTE — Assessment & Plan Note (Signed)
[facility]                            CARDIOLOGY OFFICE NOTE   NAME:[name], [name]                      MRN:          [id number]  DATE:09/01/2007                            DOB:          07/22/1929    [name] is stable today.  She is not having any significant cardiac  problems.  She has known coronary disease.  She underwent single-vessel  coronary artery bypass grafting with a vein graft to the left anterior  descending in 2003, which was widely patent on followup catheterization.  She has a history of subclavian stenosis that was treated percutaneously  by Dr. Chales Abrahams in the past.  She is not having any syncope or presyncope.   PAST MEDICAL HISTORY:   ALLERGIES:  PENICILLIN, AVALIDE, TOPROL, question IODINE and ASPIRIN.   OTHER MEDICAL PROBLEMS:  See the complete list on the note of May 06, 2007.   REVIEW OF SYSTEMS:  She has some musculoskeletal problems, but no  cardiac problems.  She asked if using Tylenol PM for sleep was safe and  I told her yes.  Otherwise, her review of systems is negative.   PHYSICAL EXAMINATION:  Her weight is 179 pounds which has increased.  Blood pressure is 160/90, and this also is increased.  She is to see Dr.  Kriste Basque tomorrow and if it is up again, he will probably want to adjust  some of her medicines.  She is oriented to person, time, and place.  Affect is normal.  HEENT:  Reveals no xanthelasma.  She has normal extraocular  motion.  There are no carotid bruits.  There is no jugular venous distension.  LUNGS:  Clear.  Respiratory effort is not labored.  CARDIAC EXAM:  Reveals an S1 with an S2.  There are no clicks or  significant murmurs.  The abdomen is obese but soft.  There are normal bowel sounds.  There is no significant peripheral edema.   EKG reveals sinus rhythm and no significant change.   PROBLEMS:  Include the complete list on the note of June 11, 2007 and  May 06, 2007.  #4.  Coronary  artery disease, this is stable.  #7.  Hypertension.  She will see Dr. Kriste Basque tomorrow, and he can adjust  her meds further if her pressure is high on the 2nd evaluation.   I can see her for cardiology followup in 6 months.     Luis Abed, MD, [facility]  Electronically Signed    JDK/MedQ  DD: 09/01/2007  DT: 09/01/2007  Job #: [id number]   cc:   Lonzo Cloud. Kriste Basque, MD

## 2011-03-20 NOTE — Assessment & Plan Note (Signed)
[facility]                            CARDIOLOGY OFFICE NOTE   NAME:[name], [name]                      MRN:          [id number]  DATE:12/31/2008                            DOB:          [birth year]    [name] is here for cardiology followup.  I saw her last on June 11, 2008.  She has coronary artery disease, but she has been stable.  She  had fallen and hurt her knee.  She has also had some difficulties with  her psychological status that is followed through Dr. Kriste Basque.  Today, she  seems similar to the past.  She is not having any chest pain.  She has  no palpitations.  She has no shortness of breath.   PAST MEDICAL HISTORY:   ALLERGIES:  PENICILLIN, AVALIDE, TOPROL, question of IODINE, and  ASPIRIN.   MEDICATIONS:  See the flow sheet in EMR.   OTHER MEDICAL PROBLEMS:  See the extensive list on my note of March 02, 2008.   REVIEW OF SYSTEMS:  She is not having any fevers or chills.  She has  continued to have the wound on her right [facility].  She has no headaches.  There is no GI or GU symptoms.  Her  review of systems is negative.   PHYSICAL EXAMINATION:  VITAL SIGNS:  Blood pressure is 124/80.  Pulse is  75.  GENERAL:  The patient is oriented to person, time, and place.  Affect is  usual for her today.  HEENT:  No xanthelasma.  She has normal extraocular motion.  NECK:  There are no carotid bruits.  There is no jugular venous  distention.  LUNGS:  Clear.  Respiratory effort is not labored.  CARDIAC:  An S1 with an S2.  There are no clicks or significant murmurs.  ABDOMEN:  Soft.  She has no peripheral edema.  There is a bandage on her  right knee.   EKG is unchanged from the past.   Problems are listed extensively on the note of March 02, 2008.  #1.  Coronary artery disease.  This is stable.  She needs no further  workup at this time.  Her overall cardiac status is stable.  No change.     Luis Abed, MD, [facility]  Electronically Signed    JDK/MedQ  DD: 12/31/2008  DT: 01/01/2009  Job #: [id number]

## 2011-03-20 NOTE — Assessment & Plan Note (Signed)
[facility]                            CARDIOLOGY OFFICE NOTE   NAME:[name], [name]                      MRN:          [id number]  DATE:06/11/2008                            DOB:          [birth year]    HISTORY OF PRESENT ILLNESS:  [name] is doing well.  I saw her on April 07, 2008 and we thought that some of her symptoms might be from sinus  tachycardia.  I have put her on low-dose beta blocker.  She feels much  better.  She is tolerating short-acting Lopressor b.i.d..  She did not  feel well on Toprol in the past.  Overall, she is doing well.  Today she  is here and she is looking good.   PAST MEDICAL HISTORY:   ALLERGIES:  PENICILLIN, AVALIDE, question of TOPROL, IODINE and question  of ASPIRIN.   MEDICATIONS:  See the medication list on the chart.   OTHER MEDICAL PROBLEMS:  See the complete list on the note of March 02, 2008.   REVIEW OF SYSTEMS:  She feels well today.   PHYSICAL EXAMINATION:  VITAL SIGNS:  Blood pressure is 106/68 with a  pulse of 70.  GENERAL:  The patient is oriented to person, time, and place.  Affect is  normal.  HEENT:  Reveals no xanthelasma.  She has normal extraocular motion.  NECK:  There are no carotid bruits.  There is no jugular venous  distention.  LUNGS:  Clear.  Respiratory effort is not labored.  CARDIAC:  Exam reveals S1 and S2.  There are no clicks or significant  murmurs.  ABDOMEN:  Soft.  She has no peripheral edema.   No labs were done today.   IMPRESSION:  See the problem list of March 02, 2008.  The patient's  palpitations are better on low-dose Lopressor and she will be kept on  this.  I will see her back for cardiology followup in 6 months.     Luis Abed, MD, [facility]  Electronically Signed    JDK/MedQ  DD: 06/11/2008  DT: 06/12/2008  Job #: [id number]

## 2011-03-20 NOTE — Assessment & Plan Note (Signed)
[facility]   NAMEMARILYNNE, [name]               ACCOUNT NO.:  [id number]   MEDICAL RECORD NO.:  [id number]      DATE OF BIRTH:  [birth year]   PHYSICIAN:  Leonie Man, M.D.    VISIT DATE:  12/27/2008                                   OFFICE VISIT   PROBLEM:  Right-sided knee ulcer.   HISTORY:  The patient is a 77 year old female who fell in November 2009  where she got a large hematoma over the anteromedial aspect of her right  knee which eventually ulcerated.  This has been an indolent ulcer since  then.  She was seen on December 20, 2008 where she underwent treatment  with debridement and started on wet-to-dry dressings 3 times weekly.  She returns today for a continued treatment of this ulcer.   PHYSICAL EXAMINATION:  The patient is feeling generally well.  Her vital  signs are stable with temperature of 98.3, pulse 74, respirations 16,  blood pressure 114/75.  The ulcer on today's evaluation, it measures 1.3  x 2.0 x 0.8 cm in size.  There is some undermining up to approximately 1  cm in the 7 o'clock axis of the ulcer.  Most of the base of the ulcer is  now granulating well.  There is still some fibrinous exudate at 10  o'clock and 4 o'clock axis of the ulcer.   I washed this and prepped the wound with Betadine.  I infiltrated the  periwound area with 1% lidocaine plain.  This allowed me to fairly  vigorously debride the remaining areas of necrosis within the ulcer.  This was irrigated with saline and packed with saline-soaked gauze.  I  will continue her on wet-to-dry dressings over the next week, being seen  by home health every other day.      Leonie Man, M.D.  Electronically Signed     PB/MEDQ  D:  12/27/2008  T:  12/28/2008  Job:  [id number]

## 2011-03-20 NOTE — Assessment & Plan Note (Signed)
[facility]                            CARDIOLOGY OFFICE NOTE   NAME:[name], [name]                      MRN:          [id number]  DATE:04/07/2008                            DOB:          [birth year]    I saw [name] on March 02, 2008.  She was having some  palpitations.  She has worn an event recorder.  During this time, she  had no dangerous arrhythmias.  She did have some palpitations with some  sinus tachycardia.  I have this in the full report.  I do not in the  final report summary. However, I do not have the exact strips in front  of me today.  These will be reviewed.  She has not had any chest pain,  syncope or pre-syncope.   PAST MEDICAL HISTORY:   ALLERGIES:  PENICILLIN, AVALIDE, QUESTION OF TOPROL, AND IODINE, AND  QUESTION OF ASPIRIN.   MEDICATIONS:  See the medication list on the chart.   OTHER MEDICAL PROBLEMS:  See the complete list on the note of March 02, 2008.   REVIEW OF SYSTEMS:  She is feeling relatively well today and a review of  systems is negative.   PHYSICAL EXAM:  Blood pressure is 114/65 with a pulse 77.  The patient  is oriented to person, time and place.  Affect is normal.  HEENT:  Reveals no xanthelasma.  She has normal extraocular motion.  There are no carotid bruits.  There is no jugular venous distention.  Lungs are clear.  Respiratory effort is not labored.  CARDIAC:  Exam reveals S1-S2.  There are no clicks or significant  murmurs.  The abdomen is soft.  She has no peripheral edema.   PROBLEMS:  Are listed on the note of March 02, 2008.   Sensation of some shortness of breath and feeling of palpitations.  This  may be from some of her sinus tachycardia.  I do want to add a low dose  of a beta-blocker.  There is history that she may not a felt well with  Toprol in the past.  We will use low-dose Lopressor.  I will see her for  followup.  I will also review wall of the strips.     Luis Abed, MD, [facility]  Electronically Signed    JDK/MedQ  DD: 04/07/2008  DT: 04/07/2008  Job #: [id number]

## 2011-03-20 NOTE — Discharge Summary (Signed)
[name], [name]               ACCOUNT NO.:  [id number]   MEDICAL RECORD NO.:  [id number]          PATIENT TYPE:  INP   LOCATION:  5157                         FACILITY:  [facility]   PHYSICIAN:  Lonzo Cloud. Kriste Basque, MD     DATE OF BIRTH:  September 20, 1929   DATE OF ADMISSION:  10/01/2008  DATE OF DISCHARGE:  10/08/2008                               DISCHARGE SUMMARY   FINAL DIAGNOSES:  1. Admitted October 01, 2008, via the office with a large hematoma on      her right knee.  The patient was unable to ambulate or bear weight      and could no longer care for herself at home.  Ortho consult with      Dr. Dion Saucier with MRI knee this admission showing large hematoma and      an incidentally noted torn meniscus.  A recommendation for physical      therapy in the nursing home setting with outpatient followup.  2. History of pulmonary embolism on Coumadin.  Coumadin was      discontinued this admission due to the large hematoma on her knee.      Pulmonary embolism occurred in 2003 and she had been on Coumadin      ever since, followed in the Coumadin clinic with a therapeutic pro-      time.  3. History of hypertension - controlled on medications.  4. Coronary artery disease with previous PTCA and subsequent coronary      artery bypass graft in 2002.  5. Known cerebrovascular disease with previous MRI showing atrophy and      moderate stenosis in the distal vertebral and mild intracranial      atherosclerotic changes.  6. History of peripheral vascular disease with left arm claudication      and previous left subclavian stent in 2002.  7. Hyperlipidemia - controlled on Caduet.  8. Diabetes mellitus - controlled on metformin and glimepiride.  9. History of hiatus hernia, gastroesophageal reflux disease with      previous dilatation.  10.History of irritable bowel syndrome.  11.History of nephrolithiasis.  12.Degenerative arthritis.  13.Osteoporosis - the patient treated with alendronate, calcium  and      vitamins.  14.History of vitamin D deficiency previously treated with      prescription vitamin D supplement, now taking over-the-counter      vitamin D supplementation.  15.Anxiety - on clorazepate.   BRIEF HISTORY AND PHYSICAL:  The patient is a 77 year old white female  with history as noted above.  She fell on November 21 at home, having  lost her balance in the bathroom and fell onto her right knee.  She  noticed the knee started to swell and became progressively worse and she  went to the emergency room on September 29, 2008.  X-ray was negative and  her INR at that time was 4.5.  Coumadin has been managed in the Coumadin  clinic and was placed on hold.  She saw Dr. Farris Has on November 25 in his  office and he was unable to aspirate fluid from the knee.  She was sent  home but unable to manage on her own.  Swelling and pain in the knee  increased and she called our office for help.  On examination she had a  large hematoma in her right knee, unable to bend the knee or bear weight  on her right leg.  She was admitted for orthopedic consultation.   PAST MEDICAL HISTORY:  As noted, she has a history of recurrent  bronchitic infections but nothing recent.  She has had a mildly abnormal  chest x-ray with a 7 mm right lower lobe nodule that has been followed.  It was not visible on her last chest x-ray in September of 2009.  She  has a history of pulmonary embolism in 2003 and has been on Coumadin  ever since and followed in the Coumadin clinic.  She has moderate  hypertension controlled on medications.  She sees Dr. Myrtis Ser for  cardiology.  She has coronary artery disease with a previous PTCA to the  LAD and subsequent coronary artery bypass graft in 2002.  She has known  cerebrovascular disease with an MRI showing atrophy and an MRA showing  moderate stenosis in the distal vertebral artery and mild intracranial  atherosclerotic changes as well.  As noted, she has been on the  Coumadin  therapy since 2003.  She has known arteriosclerotic peripheral vascular  disease with a previous history of left arm claudication and a left  subclavian stent placed in 2002.  She has hyperlipidemia on Caduet with  good control.  She has diabetes mellitus on glimepiride and metformin  with good control.  She has a history of hiatus hernia, gastroesophageal  reflux disease and takes omeprazole.  She has irritable bowel syndrome  and some diverticulosis.  She has a history of nephrolithiasis.  She has  moderate degenerative joint disease and known osteoporosis on  alendronate, calcium and vitamins.  She had previous vitamin D  deficiency supplemented with 50,000 unit vitamin D capsules weekly.  Her  followup level improved and she is now on 1000 unit over-the-counter  supplementation daily.  She has moderate anxiety and uses clorazepate.  She had recently had episode of some confusion and paranoid ideations.  She had previously seen Dr. Orlin Hilding in 2004 for a similar episode.  She  responded to a small dose of Seroquel.   PHYSICAL EXAMINATION:  GENERAL:  Physical examination at time of  admission revealed an elderly white female in mild distress from right  knee pain.  HEENT:  Exam was unremarkable.  CHEST:  Was clear to percussion and auscultation.  CARDIAC:  Exam revealed a regular rate and rhythm, normal S1 and S2  without murmurs, rubs, gallops heard.  ABDOMEN:  Abdomen was slightly obese, soft, nontender without evidence  of organomegaly or masses.  EXTREMITIES:  Showed marked swelling and bruising in the right knee with  a fluctuant hematoma present.  Range of motion was painful.  Weightbearing was difficult.  The left knee appeared okay.  NEUROLOGICAL:  Exam was intact.   LABORATORY DATA:  EKG showed normal sinus rhythm with a first degree AV  block and nonspecific ST-T wave changes.  No acute abnormalities.  Chest  x-ray - calcified aorta and some left apical scarring,  lungs otherwise  clear.  No acute abnormalities.  X-ray of the right ankle showed  question of a tiny avulsion fragment at the tip of the medial malleolus.  MR scan of the right knee showed a large subcutaneous hematoma adjacent  to prepatellar bursitis and a grade III oblique tear in the posterior  horn and mid body of the medial meniscus extending to the inferior  meniscus surface.  There was also a small Baker's cyst.  No knee  effusion.  There was some edema within the tibialis anterior muscle  possibly representing some muscle injury.  CBC showed hemoglobin 11.2,  hematocrit 33.5, white count 8500 with a normal differential.  Subsequent CBC showed hemoglobin dropped to 8.8 and 8.6.  She was  treated with supplemental iron and vitamin C therapy.  Pro-time on  admission 22.6, INR 1.9.  D-dimer negative at 0.29.  Metabolic panel  showed sodium 161, potassium 4.3, chloride 106, CO2 24, BUN 7,  creatinine 0.7, blood sugar 122, alk phos 51, bilirubin 1.0, SGOT 28,  SGPT 26, total protein 6.4, albumin 3.3, calcium 8.7, hemoglobin A1c  6.1.  Culture from the knee was negative.   HOSPITAL COURSE:  The patient was admitted for pain control and  orthopedic consultation.  She was seen by Dr. Farris Has and Dr. Dion Saucier.  They recommended the MRI scan.  She was seen by physical therapy and  occupational therapy.  She was allowed to get up with a walker and have  some light touch with the leg.  The MRI showed a large hematoma and an  incidentally noted meniscus tear.  The orthopedist did not feel that she  needed attention to the meniscus tear at this time.  There was no  specific additional therapy recommended for the hematoma as they felt  this would have to resolve slowly over time.  They recommended nursing  home placement for rehab purposes.   The patient's Coumadin had been held since her visit to the office.  It  became apparent that she would have to discontinue the Coumadin at this  time in  order to get resolution of this hematoma.  She was given support  hose and instructions for exercising her calf muscles as much as  possible.  The patient was covered with vancomycin initially and when it  became apparent there was no infection present this was discontinued.  She was continued on her home medicines as usual and did well from a  medical standpoint.   A bed was procured at an extended care facility.  The patient is  currently awaiting transfer to that facility for ongoing rehab.   MEDICATIONS AT DISCHARGE:  1. Per Dr. Farris Has, orthopedic surgeon, [facility] recommended for physical      therapy.  She needs an appointment to see him in the office in 2      weeks, call [phone] for appointment.  2. Metoprolol 25 mg p.o. b.i.d.  3. Avapro 150 mg p.o. daily.  4. Bumex 0.5 mg p.o. daily.  5. Caduet 5/20 one tablet daily.  6. Metformin 500 mg p.o. q.a.m.  7. Glimepiride 1 mg p.o. q. a.m.  8. Omeprazole 20 mg p.o. daily.  9. Alendronate 70 mg p.o. q. Week.  10.Caltrate 1 tablet p.o. b.i.d.  11.Vitamin D 1000 units daily.  12.Ferrous sulfate 325 mg p.o. daily with vitamin C 500 mg p.o. daily.  13.Clorazepate 7.5 mg one half to one tablet p.o. t.i.d. as needed for      nerves.  14.The patient was instructed to stop her previous Coumadin therapy.   DISCHARGE INSTRUCTIONS:  She is to wear TED hose when up and about  daily.  Daily physical therapy for leg strengthening and ambulation.  She is to exercise her  legs with a leg board in the bed and on the floor  daily.   FOLLOWUP:  Followup office visit with Dr. Kriste Basque in 1 month.   CONDITION ON DISCHARGE:  Improved.      Lonzo Cloud. Kriste Basque, MD  Electronically Signed     SMN/MEDQ  D:  10/08/2008  T:  10/08/2008  Job:  [id number]

## 2011-03-20 NOTE — Assessment & Plan Note (Signed)
[facility]                            CARDIOLOGY OFFICE NOTE   NAME:[name], [name]                      MRN:          [id number]  DATE:03/02/2008                            DOB:          Feb 04, 1929    [name] is here for cardiac followup.  She is post CABG.  She has not  had any major chest pain.  There has been no syncope or presyncope.  She  mentions that at times she feels slight shortness of breath.  She feels  that she is having an abnormal heartbeat.  However, she does not have  true palpitations.  She has had no syncope or presyncope.   PAST MEDICAL HISTORY:   ALLERGIES:  1. PENICILLIN.  2. AVALIDE.  3. TOPROL.  4. Question of IODINE and ASPIRIN.   CURRENT MEDICATIONS:  1. Avapro 150.  2. Caduet 5/20.  3. Omeprazole.  4. Metformin 500.  5. Bumetanide 0.5 daily.  6. Coumadin as directed.  7. Xanax 0.5 b.i.d.  8. Boniva monthly  9. Glimepiride 1 mg daily.   OTHER MEDICAL PROBLEMS:  See the complete list below.   REVIEW OF SYSTEMS:  She has had a recent upper respiratory infection.  She seems recovered from this.  Otherwise, her review of systems is  negative.   PHYSICAL EXAMINATION:  VITAL SIGNS:  Blood pressure is 147/80, with a  pulse of 85.  Her weight is 178 pounds.  GENERAL:  The patient is oriented to person, time, and place.  Affect is  normal.  HEENT:  Reveals no xanthelasma.  She has normal extraocular motion.  There are no carotid bruits.  There is no jugular venous distention.  LUNGS:  Clear.  Respiratory effort is not labored.  CARDIAC:  Reveals an S1 with an S2.  There are no clicks or significant  murmurs.  ABDOMEN:  Obese but soft.  EXTREMITIES:  She has no significant peripheral edema.   LABORATORIES:  Reveal that an EKG shows no significant change in the  past.  She has a rightward axis.  She has normal sinus rhythm.   PROBLEMS:  1. Include coronary disease, with coronary artery bypass graft in  2003.  I am still looking for the catheterization that she has had      since then, but it showed that the vein graft to her LAD was widely      patent.  She had some disease of her septal perforators.  It is of      note that her mammary could not use at the time of surgery because      it was small caliber.  2. History of subclavian stenosis, treated with percutaneous coronary      intervention by Dr. Chales Abrahams in the past.  3. History of mild carotid disease.  Her studies in the past have      shown only minimal disease, and she does not need a study at this      time.  4. History of hypertension.  5. Asthmatic bronchitis.  6. Hiatal hernia and gastroesophageal reflux disease.  7.  Irritable bowel.  8. Degenerative joint disease.  9. Some anxiety.  10.Status post cholecystectomy, hysterectomy, hemorrhoid surgery, and      appendectomy.  11.Hyperlipidemia, treated.  12.Obesity.  She clearly needs to lose weight.  13.Volume status.  Stable at this time.  14.History of a significant pulmonary embolus in 2002, and she will      remain on Coumadin indefinitely.  15.Long-term Coumadin therapy.  16.Status post fracture of both arms when she fell, with no syncope.  17.Inability to tolerate aspirin.  Because she is on Coumadin, I have      not chosen to put her on Plavix.  18.Allergies to PENICILLIN, AVALIDE, TOPROL, and question LODINE.  **19.  Current sensation of feeling very sudden, very limited shortness  of breath, and feeling that she is having some type of limited  palpitations.  We will have her wear an event recorder to see if we can  get a better handle on these symptoms.  I will then see her back for  followup.   At this point, I am not sure why we cannot find the catheterization that  has been done since 2003.  It is possible that the last catheterization  was done in 2003 because there is description that her vein was widely  patent.  I will not evaluate the ischemic question  any further at this  time, and I will see her for follow-up.     Luis Abed, MD, [facility]  Electronically Signed    JDK/MedQ  DD: 03/02/2008  DT: 03/02/2008  Job #: [id number]

## 2011-03-20 NOTE — H&P (Signed)
[name], [name]               ACCOUNT NO.:  [id number]   MEDICAL RECORD NO.:  [id number]          PATIENT TYPE:  EMS   LOCATION:  MAJO                         FACILITY:  [facility]   PHYSICIAN:  Thomas C. Wall, MD, FACCDATE OF BIRTH:  1929/09/17   DATE OF ADMISSION:  05/14/2007  DATE OF DISCHARGE:                              HISTORY & PHYSICAL   PRIMARY CARDIOLOGIST:  Luis Abed, MD, [facility].   PRIMARY CARE Kenishia Plack:  Lonzo Cloud. Kriste Basque, M.D.   PATIENT PROFILE:  A 77 year old  Caucasian female with prior history of  CAD and CABG x1 in October 2002, who presents with chest and epigastric  discomfort.   PROBLEM LIST:  1. Chest and epigastric pain.  2. CAD.  2A.  Status post stenting x3 of the LAD in January 2002.  2B.  Status post 2 additional stents to the LAD and February 2002.  2C.  Status post CABG x1, with a vein graft to the LAD in October 2002.  2D.  Last catheterization in October 2003, following abnormal Myoview  revealing patent vein graft to the LAD.  The patient was medically  managed.  1. Normal LV function by echocardiogram in March 2007 with an EF of      65%.  2. Hypertension.  3. Hyperlipidemia.  4. Type 2 diabetes mellitus.  5. GERD.  6. History of anxiety.  7. History of left subclavian artery stenosis.  8. Nephrolithiasis.  9. Asthmatic bronchitis.  10.Esophageal stricture.  11.Irritable bowel syndrome.  12.Hiatal hernia.  13.DJD.  14.Obesity.  15.History of PE in 2002, on chronic Coumadin.   ALLERGIES:  1. PENICILLIN.  2. ASPIRIN.  3. AVALIDE.  4. TOPROL.  5. LODINE.   HISTORY OF PRESENT ILLNESS:  A 77 year old Caucasian female with prior  history of CAD, status post CABG x1 in October 2002, whose last  catheterization in 2003 revealed patency of the vein graft to the LAD.  She has been medically managed since and has done relatively well  without symptoms until today at approximately 12 noon, when she was  resting and developed 10/10 sharp  epigastric and lower sternal  discomfort without radiation and associated with mild shortness of  breath.  She took 2 sublingual nitroglycerin, with minimal effect, then  had her brother bring her into the [facility] ED approximately 1 hour  later.  Following arrival to the ED, she started to feel better and is  now pain free.  ECG shows no acute changes, and first set of cardiac  markers are negative x1.  Her discomfort is reproducible with palpation  over the epigastric area.   HOME MEDICATIONS:  1. Bumex 0.5 mg daily.  2. Metformin 5 mg b.i.d.  3. Avapro 150 mg daily.  4. Clorazepate 7.5 mg.  5. Omeprazole 20 mg daily.  6. Caduet 05/20 mg daily.  7. Glimepiride 1 mg daily.  8. Warfarin as directed.  9. Boniva 150 mg q.month.   FAMILY HISTORY:  Mother died of a stroke at age 59.  She also had a  history of hypertension and bladder cancer.  Father died  of a stroke and  hypertension at 91.  She has a brother who is alive and well at age 31  who lives next door to her.   SOCIAL HISTORY:  She lives in [place] by herself.  Her brother lives  next door.  She is retired.  She denies any tobacco, alcohol, or drug  use.   REVIEW OF SYSTEMS:  Positive chest pain, shortness breath as outlined in  the HPI.  She has seasonal allergies.  She also has a history of GERD  and esophageal stricture.  She has occasional fluttering palpitations.  All other systems were reviewed and are negative.   PHYSICAL EXAMINATION:  VITAL SIGNS:  Temperature 97.4, heart rate 92,  respirations 16, blood pressure is 134/73, pulse oximetry 95% on room  air.  GENERAL:  Pleasant white female in no acute distress.  Awake, alert, and  oriented x3.  NECK:  No bruits or JVD.  LUNGS:  Respirations are regular and unlabored.  Clear to auscultation.  CARDIAC:  Regular S1, S2.  No S3 or S4.  ABDOMEN:  Round, soft, nondistended.  She does have reproducible  epigastric discomfort to palpation.  Bowel sounds  present.  LOWER EXTREMITIES:  Warm, dry, pink.  No clubbing, cyanosis or edema.  Dorsalis pedis and posterior tibial pulse 2+ and equal bilaterally.   EKG shows sinus rhythm, with a right axis, rate of 97 beats per minute.  No acute ST-T changes.  Sodium 141, potassium 3.7, chloride 108, CO2  23.1, BUN 10, creatinine 0.9, glucose 114.  CK-MB 1.1, troponin I less  than 0.5.   ASSESSMENT AND PLAN:  1. Chest and epigastric pain, with history of coronary artery disease.      Plan to admit and cycle cardiac markers.  Provided that enzymes are      negative and she has no recurrent discomfort, will likely plan on      discharge in the a.m., with follow-up outpatient Myoview.  Notably,      she did have an abnormal Myoview in the past which was felt to be      falsely abnormal.  2. Hypertension.  Stable.  Continue current regimen.  3. Hyperlipidemia.  Check lipids and LFTs.  Continue statin.  4. History of esophageal stricture and a hiatal hernia.  Question      contribution to symptoms.  Will continue PPI.  Could consider      Reglan..  5. Type 2 diabetes mellitus.  Will hold her metformin in case she      rules in and requires catheterization.  Otherwise, continue Amaryl.      Will write for sliding scale and follow as well.      Nicolasa Ducking, ANP      Jesse Sans. Daleen Squibb, MD, [facility]  Electronically Signed    CB/MEDQ  D:  05/14/2007  T:  05/15/2007  Job:  [id number]

## 2011-03-20 NOTE — Assessment & Plan Note (Signed)
[facility]   NAMEARIYANA, [name]               ACCOUNT NO.:  [id number]   MEDICAL RECORD NO.:  [id number]      DATE OF BIRTH:  [birth year]   PHYSICIAN:  Joanne Gavel, M.D.         VISIT DATE:  02/22/2009                                   OFFICE VISIT   HISTORY OF PRESENT ILLNESS:  This patient had a nonhealing right knee  wound from a hematoma that occurred in November 2009.  Last time the  patient was seen, the wound was approximately 0.3 x 0.6 x 0.3 cm.  She  was treated with Neosporin and a dry dressing.  Today, the wound is  completely healed over.  There is a step-off, but there is a healthy  epithelium in the base of the wound.   PLAN:  See p.r.n.      Joanne Gavel, M.D.  Electronically Signed     RA/MEDQ  D:  02/22/2009  T:  02/23/2009  Job:  [id number]

## 2011-03-20 NOTE — Assessment & Plan Note (Signed)
[facility]   NAMETANEA, MOGA               ACCOUNT NO.:  [id number]   MEDICAL RECORD NO.:  [id number]      DATE OF BIRTH:  08/19/1929   PHYSICIAN:  Leonie Man, M.D.    VISIT DATE:  02/08/2009                                   OFFICE VISIT   PROBLEM:  Nonhealing right knee wound from a ruptured peripatellar  hematoma around Thanksgiving of 2009.  This ulcer has been healing  slowly over that period of time and on her last evaluation, the wound  was healing quite well and was down to a very small opening of about 0.3  x 0.6 x 0.3 cm.  She was continued on Neosporin and a dry dressing which  she has been doing daily.   She returns today for evaluation.   PHYSICAL EXAMINATION:  Temperature 97.6, pulse 77, respirations 18,  blood pressure 132/77.  The wound is now much smaller and barely admits  a tip of an applicator stick measured at 0.3 x 0.8 x 0.2 cm.  There is  an eschar within this which is debrided away and revealing clean  epithelializing granulations.   The skin edges are slightly row, so I will go ahead and start her on  hydrogel with Neosporin and a dry dressing and follow up with her in 2  weeks.      Leonie Man, M.D.  Electronically Signed     PB/MEDQ  D:  02/08/2009  T:  02/09/2009  Job:  [id number]

## 2011-03-20 NOTE — Assessment & Plan Note (Signed)
[facility]   [name], [name]               ACCOUNT NO.:  [id number]   MEDICAL RECORD NO.:  [id number]      DATE OF BIRTH:  [birth year]   PHYSICIAN:  Leonie Man, M.D.    VISIT DATE:  01/11/2009                                   OFFICE VISIT   PROBLEM:  Nonhealing wound of the right knee.   HISTORY:  The patient is a 77 year old female who fell during  Thanksgiving of 2009, developed a large subcutaneous hematoma which  deteriorated into a nonhealing ulcer of her knee.  She has been  undergoing debridements and damp-to-dry saline dressings.   On today's evaluation, the patient is afebrile with stable vital signs.  Her knee looks really quite good with minimal residual fibrinous  exudate.   Plan is to go ahead and treat this with central wet-to-dry dressings to  remove the remainder of the fibrin in the wound and to continue her on  hydrogel thereafter.  Follow up in 1 week.      Leonie Man, M.D.  Electronically Signed     PB/MEDQ  D:  01/11/2009  T:  01/12/2009  Job:  [id number]

## 2011-03-23 NOTE — Cardiovascular Report (Signed)
[place]. [facility]  Patient:    [name], [name] Visit Number: [id number] MRN: [id number]          Service Type: MED Location: 2000 2031 01 Attending Physician:  Talitha Givens Dictated by:   Daisey Must, M.D. [facility] Proc. Date: 08/29/01 Admit Date:  08/28/2001   CC:         Lonzo Cloud. Kriste Basque, M.D. [facility]  Luis Abed, M.D. [facility]  Cardiac Catheterization Lab   Cardiac Catheterization  PROCEDURES PERFORMED:  Left heart catheterization with coronary angiography, left ventriculography, and bilateral subclavian angiography.  INDICATIONS:  Ms. Hogans is a 77 year old woman with a history of coronary artery disease.  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  She re-presented four days later with subacute stent thrombosis.  Her LAD was reopened and two additional stents were placed.  She has since been stable but recently had an episode of chest pain consistent with angina and was referred for cardiac catheterization.  PROCEDURAL NOTE:  A 6-French sheath was placed in the right femoral artery. Standard Judkins 6-French catheters were utilized.  Contrast was Omnipaque. There were no complications.  RESULTS:  Hemodynamics:  Left ventricular pressure 160/18.  Aortic pressure 160/80. There is no aortic valve gradient.  Left ventriculogram:  There is mild hypokinesis of the anterior apical wall. Otherwise wall motion is normal.  Ejection fraction calculated at 60%.  There is mitral regurgitation which appears to be secondary to ventricular ectopy.  The left subclavian artery has a shelf-like 80% stenosis proximally.  The left internal mammary artery is patent.  The right subclavian artery and right internal mammary artery are patent.  Coronary arteriography (right dominant): 1. The left main has a distal 20% stenosis. 2. The left anterior  descending artery has a 50% stenosis in the mid vessel    followed by 100% occlusion of the mid LAD starting at the proximal portion    of the first stent.  This occlusion extends through approximately half the    length of the total of five stents that are in the mid LAD.  The distal    LAD fills via well-developed grade 3 right-to-left collaterals.  This fills    retrograde to the mid portion of the stents in the mid LAD and also fills a    normal sized third diagonal branch.  There was also a small first diagonal    branch which arises proximal to the occlusion of the LAD.  There is a    septal perforator that arises just proximal to the occlusion in the LAD    which has an 80% stenosis at its origin. 3. The left circumflex has a tubular 30% stenosis in the mid vessel.  The    circumflex gives rise to a large ramus intermedius and a normal sized    obtuse marginal branch. 4. The right coronary artery is a dominant vessel.  There is a 30% stenosis    in the proximal right coronary artery with minor luminal irregularities in    the mid right coronary artery.  The distal right coronary artery gives rise    to a large posterior descending artery which has a 20% stenosis in the mid    portion and a small posterolateral branch.  IMPRESSIONS: 1. Preserved left ventricular systolic function. 2. One-vessel coronary artery disease characterized by a long  chronic total    occlusion of the left anterior descending artery within the previously    placed stents. 3. Significant left subclavian artery stenosis as described.  PLAN:  These findings were reviewed by Dr. Chales Abrahams and myself.  We believe that the left subclavian artery could be treated with percutaneous stent placement. The patient will subsequently be referred for coronary artery bypass surgery to the distal LAD. Dictated by:   Daisey Must, M.D. [facility] Attending Physician:  Talitha Givens DD:  08/29/01 TD:  09/01/01 Job:  [id number]

## 2011-03-23 NOTE — Discharge Summary (Signed)
[place]. [facility]  Patient:    [name], [name]                      MRN: [id number] Adm. Date:  [id number] Disc. Date: 12/06/00 Attending:  Doug Sou Dictator:   Chinita Pester, CRNP CC:         Lonzo Cloud. Kriste Basque, M.D. [facility]   Discharge Summary  PRIMARY DIAGNOSIS:  Coronary artery disease.  SECONDARY DIAGNOSES:  1. Hypertension.  2. Diabetes.  HISTORY OF PRESENT ILLNESS:  She is a 77 year old female with no prior history of heart disease who was referred for evaluation of chest discomfort and abnormal EKG.  Past medical history of longstanding anxiety, asthma, bronchitis, and GERD.  She underwent prior dilatation of peptic stricture in December 1998 for evaluation of dysphagia.  She reports no recurrent dysphagia nor any symptoms of indigestion or abdominal bloating.  Cardiac risk factors were positive for hypertension and dyslipidemia with diabetes.  Patient was recommended for a pharmacological stress test which revealed anteroseptal distal inferior/inferior septal and mid-anterior ischemia, scarring in distal anterior and anteroseptal, anterolateral, and apical regions.  Calculated EF was 61%.  HOSPITAL COURSE:  Patient was scheduled and admitted for cardiac catheterization which she underwent on December 05, 2000.  Patient also underwent a stent to the proximal LAD x 2 and stent to the mid LAD.  She tolerated the procedure well, had no postop complications, and was discharged the following day in stable condition.  She was discharged to home.  DISCHARGE MEDICATIONS:  1. Prilosec 20 twice a day.  2. Norvasc 5 daily.  3. Coated aspirin 81 daily.  4. Plavix 75 daily for 28 days.  5. Evista 60 mg daily.  6. Tranxene was increased from _75 mg daily to alternating _7.5 mg daily on     odd days and 7.5 mg twice a day on even days.  7. Flonase two sprays daily.  8. Serevent two sprays twice a day.  9. Flovent two sprays twice a day. 10.  Nitroglycerin sublingual as needed.  ACTIVITY:  She was instructed not to do any heavy lifting or strenuous activity for the next three days, no driving for two days.  DIET:  Low-fat, low-cholesterol, no-added-salt diet.  DISCHARGE INSTRUCTIONS:  She was to keep her wound clean and dry.  She was allowed to shower.  She was to call the office if a lump developed or she had any drainage.  FOLLOWUP:  Patient was to have a BMET done in one week because of the increased dose of her diuretic.  She was also to follow with Dr. Myrtis Ser on December 23, 2000 at 2:30 p.m. DD:  12/06/00 TD:  12/08/00 Job: [id number]

## 2011-03-23 NOTE — Op Note (Signed)
[place]. [facility]  Patient:    [name], [name] Visit Number: [id number] MRN: [id number]          Service Type: MED Location: 2300 2308 01 Attending Physician:  Tressie Stalker Proc. Date: 09/04/01 Admit Date:  08/28/2001   CC:         Daisey Must, M.D. [facility]  Luis Abed, M.D. [facility] M. Kriste Basque, M.D. [facility]   Operative Report  PREOPERATIVE DIAGNOSIS:  Severe one vessel coronary artery disease with class IV unstable angina.  POSTOPERATIVE DIAGNOSIS:  Severe one vessel coronary artery disease with class IV unstable angina.  PROCEDURE:  Median sternotomy for coronary artery bypass grafting x 1 (saphenous vein graft to distal left anterior descending coronary artery).  SURGEON:  Salvatore Decent. Cornelius Moras, M.D.  ASSISTANT:  Liane Comber, P.A.  ANESTHESIA:  General anesthesia.  INDICATIONS:  The patient is a 77 year old obese white female followed by Lonzo Cloud. Kriste Basque, M.D. [facility] and Luis Abed, M.D. [facility] and referred by Daisey Must, M.D. [facility] for management of coronary artery disease.  The patient has a history of previous myocardial infarction for which he underwent percutaneous coronary intervention on the left anterior descending coronary artery in March of 2002.  The patient now presents with recurrent symptoms of angina and catheterization demonstrating occlusion of the left anterior descending coronary artery at the site of previous angioplasty and stent placement.  Left ventricular function is preserved.  The patient is also noted to have high grade proximal stenosis of the left subclavian artery.  She subsequently underwent percutaneous transluminal angioplasty and stent placement of the left subclavian artery by Dr. Chales Abrahams on September 02, 2001.  The patient now presents for elective surgical coronary revascularization.  CONSENT:  The patient and her family have been counseled at length regarding the indications and potential benefits of  coronary artery bypass grafting. They understand and accept all associated risks of  surgery including, but not limited to, risks of death, stroke, myocardial infarction, bleeding requiring blood transfusion, arrhythmia, infection, and recurrent coronary artery disease.  All of their questions have been addressed.  DESCRIPTION OF PROCEDURE:  The patient is brought to the operating room on the above mentioned date and invasive hemodynamic monitoring is established by the anesthesia service under the care and direction of Dr. Katrinka Blazing.  The patient is placed in the supine position on the operating table.  IV antibiotics are administered.  Following induction with general endotracheal anesthesia, the patients chest, abdomen, both groins, and both lower extremities are prepped and draped in the usual sterile fashion.  Median sternotomy incision is performed and the left internal mammary artery is dissected from the chest wall and prepared for bypass grafting.  The left internal mammary artery is very small caliber and very delicate.  Antegrade flow is modest at best.  However, initially it is felt to be potentially an acceptable conduit for bypass grafting.  The patient is heparinized systemically.  The pericardium is opened.  The patient is placed in steep Trendelenburg position and the table is rotated toward the surgeons side.  Several deep pericardial sutures are placed along the pericardial reflection overlying the left inferior pulmonary vein to elevate the heart into the operative field. The anterior wall is carefully examined.  The distal left anterior descending coronary artery is small caliber, but appears to be good quality target for bypass grafting.  A Cohn stabilization device [facility]) is utilized to facilitate exposure.  Excellent exposure is ascertained.  The distal  left internal mammary artery is trimmed and prepared for bypass grafting. Atraumatic Silastic vessel loops are  utilized with the stabilization device to provide hemostasis.  An arteriotomy is performed.  The distal left anterior descending coronary artery measures 1.1 mm in diameter.  It is very small, but is otherwise of good quality.  The vessel is very delicate.  The distal left internal mammary artery is also very small caliber.  Distal anastomosis is performed with running 8-0 Prolene suture.  After completion of the anastomosis, bleeding is appreciated in the proximal portion of the left internal mammary artery.  An initial attempt at repairing the site of bleeding is performed, but this is felt to provide unsatisfactory result.  On careful examination, one can appreciate that the left internal mammary artery has subintemal dissection in it at this level as well as proximally near its takeoff from the left subclavian artery.  Therefore, this vessel is felt not to be appropriate to be utilized as a conduit for bypass grafting.  A full dose of systemic heparin is given.  The proximal left internal mammary artery is doubly oversewn and ligated.  A portion of saphenous vein is obtained from the patients right thigh through a longitudinal incision.  The saphenous vein is felt to be excellent quality conduit.  The ascending aorta and the right atrium are cannulated for cardiopulmonary bypass.  Adequate heparinization is verified.  Cardiopulmonary bypass is begun. A cardioplegic catheter is placed in the ascending aorta.  The patient is cooled to 32 degrees systemic temperature. The aortic crossclamp is applied and cardioplegia is delivered in an antegrade fashion through the aortic root.  Iced saline slush is applied for topical hypothermia.  The initial cardioplegic arrest and myocardial cooling are felt to be satisfactory.  Repeat dose of cardioplegia is administered through the saphenuos vein graft placed subsequently to provide adequate cardioplegia to  the distal left anterior descending  coronary artery distribution.  The saphenuos vein graft is then sewn end-to-side into the distal left anterior descending coronary artery.  The proximal anastomosis is performed directly to the ascending aorta prior to removal of the aortic crossclamp.  Rewarming is begun and the aortic crossclamp is removed.  Total crossclamp time for the operation is 31 minutes.  The heart begins to beat spontaneously without need for cardioversion.  The proximal and distal anastomoses are both carefully examined for hemostasis and appropriate graft orientation.  Epicardial pacing wires are fixed to the right ventricular outflow tract and to the right atrial appendage.  The patient is rewarmed to greater than 37 degrees Centigrade temperature.  The patient is weaned from cardiopulmonary bypass without difficulty.  The patients rhythm at separation from bypass is junctional bradycardia and dual chamber AV sequential pacing is employed.  Normal sinus rhythm resumed spontaneously after short period of time following separation from bypass. Total cardiopulmonary bypass time for the operation is 69 minutes.  The venous and arterial cannuli are removed uneventfully.  Protamine is administered to reverse the anticoagulation.  The mediastinum and left chest are irrigated with saline solution.  Meticulous surgical hemostasis is ascertained.  The patient was transfused two units of packed red blood cells during cardiopulmonary bypass due to anemia with hematocrit of 16% after institution of cardiopulmonary bypass.  The patient was transfused one 10-pack of adult platelets due to thrombocytopenia with platelet count below 90,000 during cardiopulmonary bypass.  Platelets were administered after reversal of heparin with Protamine.  The mediastinum and the left chest are drained with  three chest tubes placed through separate stab incisions inferiorly.  The median sternotomy is closed in routine fashion.  The deep  subcutaneous tissues of the right thigh are drained with a 19 french Blake drain.  The right thigh incision is closed in multiple layers in routine fashion.  Skin staples are used to close the thigh skin incision where as a subcuticular closure is used for the sternal incision.  The patient tolerated the procedure well and is transported to the surgical intensive care unit in stable condition.  There are no intraoperative complications.  All sponge, needle, and instrument counts were correct at completion of the operation. Attending Physician:  Tressie Stalker DD:  09/04/01 TD:  09/05/01 Job: [id number]

## 2011-03-23 NOTE — Cardiovascular Report (Signed)
[name], [name]                         ACCOUNT NO.:  [id number]   MEDICAL RECORD NO.:  [id number]                   PATIENT TYPE:  INP   LOCATION:  4713                                 FACILITY:  [facility]   PHYSICIAN:  Charlton Haws, MD [facility]                DATE OF BIRTH:  20-Oct-1929   DATE OF PROCEDURE:  DATE OF DISCHARGE:                              CARDIAC CATHETERIZATION   PROCEDURE:  Coronary arteriography.   INDICATIONS FOR PROCEDURE:  Abnormal Cardiolite study with chest pain.   PROCEDURAL NOTE:  Standard catheterization was done from the right femoral  artery.  Before the case, there was a question of a dye or shrimp allergy.  The patient has been catheterized multiple times without premedication.  The  case was initially cancelled.  Dr. Myrtis Ser spoke with the patient and her  family and she was given some Solu-Medrol, Benadryl, and Pepcid prior to the  case.  She had no complications from the dye during the case.   FINDINGS:  1. The left main coronary artery had a 20% distal stenosis.  2. The left anterior descending artery was 100% occluded at the previous     overlapping stent sites.  The occlusion was distal to the takeoff of the     first septal perforator.  The first septal perforator had an 80%     eccentric stenosis.  3. The left anterior descending artery was diffusely diseased but filled     normally from a saphenous vein graft which was pristine.  4. The circumflex coronary artery had a 30% stenosis in a second obtuse     marginal branch.  The first obtuse marginal branch was normal.  There was     an intermedius branch with 30% tubular disease proximally.  5. The right coronary artery was a large vessel and dominant.  The proximal     right coronary artery had a 30-40% tubular lesion.  The distal vessel had     30% multiple discrete lesions.  6. RAO ventriculography:  RAO ventriculography showed mild anteroapical wall     hypokinesis.  EF was 65%.  There was  no gradient across the aortic valve     and no MR.  7. Aortic pressure was 164/71.  LV pressure was 160/19.   IMPRESSION:  The patient appeared to have a false positive Cardiolite study.  She has diffuse disease in her LAD but the vein graft is widely patent.  I  do not think the septal perforator is relevant to the anteroapical defect  seen on her Cardiolite.  In general, we do not dilate the septal perforator  lesions since they are intramyocardial.  Overall, I think she is low risk  for recurrent MI.  Charlton Haws, MD [facility]    PN/MEDQ  D:  09/03/2002  T:  09/03/2002  Job:  [id number]

## 2011-03-23 NOTE — Cardiovascular Report (Signed)
[place]. [facility]  Patient:    [name], [name]                      MRN: [id number] Proc. Date: 12/09/00 Adm. Date:  [id number] Attending:  Alric Quan CC:         Lonzo Cloud. Kriste Basque, M.D. [facility]  Luis Abed, M.D. [facility]  Cardiac Catheterization Lab   Cardiac Catheterization  PROCEDURES PERFORMED: 1. Left heart catheterization with coronary angiography and left    ventriculography. 2. Percutaneous transluminal coronary angioplasty with stent placement in the    mid left anterior descending artery.  INDICATIONS:  [name] is a 77 year old woman who recently underwent percutaneous intervention of the mid left anterior descending artery by Dr. Juanda Chance.  She presented with a total occlusion of the mid left anterior descending artery which was felt to be recent, within the past 1 to 2 weeks. Dr. Juanda Chance successfully reopened the LAD and placed 3 stents in the mid left anterior descending artery.  She was subsequently discharged home but then represented yesterday with recurrent chest pain which was relieved with medical therapy.  She did rule in for a myocardial infarction.  We therefore brought her back to the catheterization laboratory today to rule out stent thrombosis.  CATHETERIZATION PROCEDURAL NOTE:  A 6-French sheath was placed in the right femoral artery.  Standard Judkins 6-French catheters were utilized.  Contrast was Omnipaque.  There were no complications.  CATHETERIZATION RESULTS:  Hemodynamics:  Left ventricular pressure 122/20, aortic pressure 122/62. There is no aortic valve gradient.  Left ventriculogram:  There is moderate akinesis of the anterior wall and severe akinesis of the apical wall.  Ejection fraction is calculated at 66%.  Coronary arteriography (right dominant): 1. The left main has an ostial 30% stenosis. 2. The left anterior descending artery has a proximal 25% stenosis.  In the    mid vessel just after a  large septal perforator, the vessel is 100%    occluded.  This occlusion is at the proximal edge of the most proximal of    3 stents which were placed in the mid LAD.  The distal LAD fills via    right-to-left collaterals.  The LAD is seen filling back to the distal end    of the more distal of the 3 stents which were placed 4 days ago.  The LAD    gives rise to a small first diagonal branch proximal to the occlusion and a    normal-sized second diagonal branch which arises distal to the occluded    stents. 3. The left circumflex gives rise to a large ramus intermedius, small- to    normal-sized OM-1 and a small OM-2.  The ramus intermedius has a less than    20% stenosis. 4. The right coronary artery is a dominant vessel.  There is a 30% stenosis in    the proximal vessel, a 25% stenosis in the mid vessel, and minor    irregularities in the distal vessel.  The posterior descending artery has a    30-40% stenosis.  The distal right coronary artery gives rise to a large    posterior descending artery and a small posterolateral branch.  IMPRESSIONS: 1. Left ventricular systolic function characterized by akinesis of the    anterior apical wall but overall preserved ejection fraction. 2. One-vessel coronary artery disease characterized by 100% sub acute stent    thrombosis in the mid LAD.  PLAN:  These findings were discussed with Dr. Juanda Chance.  We both felt that attempt at percutaneous intervention to reestablish patency to the LAD was the best treatment option.  See below.  PTCA PROCEDURAL NOTE:  Following completion of the diagnostic catheterization, we proceeded with coronary intervention.  The 6-French in the right femoral artery was exchanged over wire for a 7-French sheath.  We placed a 6-French sheath in the right femoral vein.  Heparin and Integrilin were administered per protocol.  We used a 7-French JL3.5 guiding catheter.  We initially attempted to cross the occluded stents with  a Hi-Torque Floppy wire; however, this would not cross.  We then used a Choice PT wire which did successfully pass beyond the occlusion and was advanced into the distal LAD.  We then utilized a 2.5- x 20-mm Maverick balloon.  This was positioned within the more distal stent and inflated to 4 atmospheres and then pulled back into the more proximal stent and inflated to 6 atmospheres.  At that point, reperfusion of the distal LAD was established.  There was noted to be a dissection in the mid LAD just distal to the more distal of the 3 stents.  This represents a potential mechanism for the sub acute stent thrombosis.  We performed additional inflations with a 2.5- x 20-mm Maverick balloon, inflating this to 3 atmospheres across the area of dissection beyond the stent and then 6 and finally 8 atmospheres in the more proximal stents.  We then deployed a 2.25- x 8-mm BX Velocity stent to cover the distal edge of the dissection with some overlap of the distal edge of the more distal of the 3 previous stents.  This stent was deployed at 9 atmospheres.  Following this, we deployed a 2.25- x 13-mm BX Velocity heparin-coated stent in the mid vessel to bridge the gap between the second and third of the previously placed stents.  The stent was deployed at 18 atmospheres.  The same stent delivery balloon was then pulled back and inflated to 18 atmospheres within the mid stent and then again to 18 atmospheres within the proximal stent.  Following this, we advanced a 2.0- x 15-mm Maverick balloon and positioned this within the recently placed most distal stent and inflated this to 12 atmospheres.  We then advanced a 2.5- x 12-mm Quantum balloon and positioned this within the stents which were now all overlapping.  This balloon was placed just proximal to the distal edge of the most distal stent, inflated to 14 atmospheres.  It was then pulled back in  sequential inflations to 16 atmospheres, then 20  atmospheres were performed, followed by 18 atmospheres at the most proximal stent.  Final angiographic images revealed patency of the LAD with less than 10% residual stenosis and TIMI-3 flow.  COMPLICATIONS:  None.  RESULTS:  Successful PTCA with stent placement x 2 in the mid LAD.  A long 100% in-stent sub acute thrombosis was reduced to less than 10% residual with TIMI-3 flow.  PLAN:  Integrilin will be continued for 48 hours.  Plavix and aspirin will be administered in combination for a minimum of 4 weeks.  DD:  12/09/00 TD:  12/10/00 Job: [id number]

## 2011-03-23 NOTE — Discharge Summary (Signed)
[name], [name]                         ACCOUNT NO.:  [id number]   MEDICAL RECORD NO.:  [id number]                   PATIENT TYPE:  ORB   LOCATION:                                       FACILITY:  [facility]   PHYSICIAN:  Erick Colace, M.D.           DATE OF BIRTH:  30-Dec-1928   DATE OF ADMISSION:  12/04/2003  DATE OF DISCHARGE:  12/10/2003                                 DISCHARGE SUMMARY   DISCHARGE DIAGNOSES:  1. Right olecranon fracture, status post open reduction and internal     fixation.  2. History of deep venous thrombosis/pulmonary embolus.  3. History of diabetes.  4. History of hypertension.  5. History of asthma.  6. History of elevated cholesterol.  7. Anemia.   HISTORY OF PRESENT ILLNESS:  The patient is a 77 year old white female with  a history of cardiovascular disease, CABG in 2002, PE, history of bilateral  upper extremity fractures (right elbow and left wrist) in 2004 in which she  sustained at [facility] from August to December of  2004.  Now presents with left elbow pain after fall.  X-rays revealed right  olecranon fracture.  Patient underwent ORIF with a bone graft on December 01, 2003 by Dr. Molli Hazard A. Weingold.  Coumadin resumed.  PT and OT revealed that  patient was ambulating 20 feet with close supervision, transfer sit to stand  modified independently, bed mobility modified independently, no  postoperative complications.  The patient was transferred to [facility]  Subacute Department on December 04, 2003.   PAST MEDICAL HISTORY:  Past medical history significant for multiple fall,  as above, reflux and asthma.   PAST SURGICAL HISTORY:  Past surgical history significant for as above, a  cholecystectomy and hysterectomy.   MEDICATIONS PRIOR TO ADMISSION:  1. Lipitor 10 mg daily.  2. Norvasc 5 mg daily.  3. Prilosec 20 mg daily.  4. Glucophage 500 mg b.i.d.  5. Advair 50/500 mcg 2 puffs daily.  6. Coumadin 4 mg  daily.   ALLERGIES:  PENICILLIN.   SOCIAL HISTORY:  [place]-level home.  The patient was discharged from extended care facility on  October 08, 2003, and she has 3 steps to entry, denies any tobacco or  alcohol use.   PRIMARY CARE [name]:  Dr. Lonzo Cloud. Nadel.   CARDIOLOGIST:  Dr. Willa Rough.   HOSPITAL COURSE:  [name] was admitted to [facility] Subacute  Department on December 04, 2003 for comprehensive inpatient rehabilitation  where she received more then 3 hours of therapy daily.  Overall, [name]  progressed very well during her 5-day stay in subacute.  She was discharged  at an overall modified independent level.  At the time of discharge, the  patient was able to ambulate greater than 120 feet at modified independent  level, able  to transfer sit to stand at modified independent level.  The  patient's pain in the elbow has been controlled with oxycodone and the  patient was followed periodically by orthopedics.  She did have x-rays  performed on the elbow on December 10, 2003, and demonstrated healing and no  acute abnormalities.  The patient had splint also changed and staples  remained intact.  The patient remained on Glucophage 5 mg for her diabetes  and no changes were necessary in this medication.  She remained on Avapro  and Norvasc; blood pressure remained under fair control while in rehab.  She  remained on Zocor 20 mg p.o. nightly for elevated cholesterol; liver  function remained stable.  The patient had no significant asthma  exacerbations while in rehab; she remained on Advair.  No other major  medical issues occurred while the patient was in subacute, therefore, she  was discharged at a modified independent level on December 10, 2003.  Latest  labs indicate latest INR was 2.2, latest hemoglobin was 10.7, hematocrit  32.0, white blood cell count 5.9, platelet count 245,000, sodium 145,  potassium  3.8, chloride of 111, CO2 27, glucose 122, BUN 4, creatinine 0.7.  The patient had a urine culture performed on December 06, 2003; there was no  growth x1.  AST 17, ALT 15, alkaline phosphatase 54.  PT report at time of  discharge indicated the patient is able to ambulate modified independently  greater than 100 feet with cane, can transfer sit to stand with close  supervision, bed mobility modified independently and can perform all ADLs  modified independently.  Patient was transferred home with her family at  modified independent level.  At time of discharge, all vitals were stable.  Right wrist remained in splint.   DISCHARGE MEDICATIONS:  1. Glucophage 500 mg twice daily.  2. Norvasc 5 mg daily.  3. Coumadin 4 mg daily.  4. Lipitor 20 mg daily.  5. Resume Advair.  6. Avapro 150 mg daily.  7. Vicodin 1 or 2 tablets every 4 to 6 hours as needed for pain.  8. Tranxene 3.75 mg daily.   ACTIVITY:  No driving, no drinking alcohol, no smoking.  She is to use right  arm splint, keep it clean and dry.  The patient refused home health therapy  and PT, therefore, the patient needs to go to Coumadin clinic next week to  have PT and INR drawn, since she is not getting home health therapy with a  nurse.  The patient also remained nonweightbearing on the right arm.   FOLLOWUP:  Follow up with Dr. Mina Marble on December 14, 2003.  Follow up with  Dr. Myrtis Ser as needed.  Follow up with primary care Malak Duchesneau within 4 to 6  weeks to check hemoglobin.  Patient also needs to follow up with Dr. Erick Colace as needed.  She is to follow up with the Coumadin clinic next  week for PT and INR draw.      Drucilla Schmidt, P.A.                         Erick Colace, M.D.    LB/MEDQ  D:  12/10/2003  T:  12/11/2003  Job:  [id number]   cc:   Artist Pais Mina Marble, M.D.  [address]  [place]  [place] [address]  Fax: [phone]   Willa Rough, M.D.  Erick Colace, M.D.  [address]   [place]  [place] [address]  Fax: [phone]   Lonzo Cloud. Kriste Basque, M.D. [facility]

## 2011-03-23 NOTE — Op Note (Signed)
[name], [name]                         ACCOUNT NO.:  [id number]   MEDICAL RECORD NO.:  [id number]                   PATIENT TYPE:  OIB   LOCATION:  2550                                 FACILITY:  [facility]   PHYSICIAN:  Artist Pais. Mina Marble, M.D.           DATE OF BIRTH:  1929/11/03   DATE OF PROCEDURE:  DATE OF DISCHARGE:                                 OPERATIVE REPORT   PREOPERATIVE DIAGNOSIS:  Recurrent right olecranon fracture.   PREOPERATIVE DIAGNOSIS:  Recurrent right olecranon fracture.   PROCEDURE:  Open reduction/internal fixation of above fracture using  congruent, contoured olecranon plate, screws, and Symphony autologous bone  graft.   SURGEON:  Dr. Mina Marble.   ASSISTANT:  RN.   ANESTHESIA:  General.   TOURNIQUET TIME:  One hour.   COMPLICATIONS:  None.   DRAINS:  None.   OPERATIVE REPORT:  Patient was taken to the operating room.  After the  induction of general anesthesia, the right upper extremity was prepped and  draped in the usual sterile fashion.  Esmarch exsanguination of the limb.  The tourniquet was inflated to 250 mmHg.  At this point in time, a posterior  midline incision was made over the olecranon and triceps area.  Flaps were  raised accordingly.  The fracture site was exposed.  Callouses were removed  from the fracture site.  Reduction was performed.  The AccuMed congruent  precontoured olecranon plate was then fastened to the bone to affix with the  appropriate screws.  At this point in time, the Symphony bone graft was  packed around the fracture site.  The soft tissue was closed in layers of 2-  0 Vicryl, 0 Vicryl, and staples on the skin.  Patient was placed in a  sterile dressing, Xeroform, 4x4s, compressive wrap, and posterior splint.  Patient tolerated the procedure well and went to the recovery room in a  stable fashion.                                               Artist Pais Mina Marble, M.D.    MAW/MEDQ  D:  12/01/2003  T:   12/01/2003  Job:  [id number]

## 2011-03-23 NOTE — H&P (Signed)
[place]. [facility]  Patient:    [name], GRIMMER Visit Number: [id number] MRN: [id number]          Service Type: MED Location: 2000 2027 01 Attending Physician:  Tressie Stalker Dictated by:   Barbaraann Share, M.D. [facility] Admit Date:  08/28/2001 Discharge Date: 09/09/2001   CC:         Lonzo Cloud. Kriste Basque, M.D. [facility]  Luis Abed, M.D. [facility]   History and Physical  HISTORY OF PRESENT ILLNESS:  The patient is a 77 year old white female who is status post CABG the end of 2002 who presents with progressive dyspnea since her CABG.  She is clearly obese, deconditioned, and has been seen multiple times in the office by both cardiology and internal medicine.  Initially, edema was the issue and she was diuresed approximately 15 pounds during the month of November.  She has now gotten to the point of panting at rest and increased shortness of breath just ambulating through her house.  She has minimal cough and no sputum to speak off.  She has had pulmonary function studies in the past that has shown minimal obstruction and moderate restriction secondary to her obesity.  She presented to the emergency room tonight with worsening shortness of breath and was found to have a saturation of 80% on room air.  Her arterial blood gas did show a PCO2 of 54 and a pH of 7.30 with a bicarbonate in the 29-30 range consistent with both acute and chronic hypercarbic respiratory insufficiency.  The arterial blood gas on 3 liters is pending at this time.  On chest x-ray the patient was found to have cardiomegaly with very dark lung fields, as well as a density in her left upper lobe that was old and documented on CT to be more inflammatory then neoplastic in nature.  PAST MEDICAL HISTORY:  1. Coronary artery disease.  She is status post CABG in the fall of 2002.  2. History of hypertension.  3. History of dislipidemia.  4. History of asthmatic bronchitis, again, with very mild  obstruction on     recent PFTs.  5. History of anxiety.  MEDICATIONS:  1. Metoprolol 25 mg b.i.d.  2. Prilosec 20 mg b.i.d.  3. Evista 60 mg q.d.  4. Lipitor 10 mg q.d.  5. Enteric-coated aspirin 81 mg q.d.  6. Furosemide 40 mg q.d.  7. Plavix 75 mg q.d.  8. Clorazepate 7.5 mg one-half to one tablet one to two times a day p.r.n.  9. Norvasc 5 mg q.d. 10. Advair of unknown strength one b.i.d. 11. Hydrocodone elixir one teaspoon q.4-6h. p.r.n. 12. Flonase p.r.n. 13. Albuterol MDI p.r.n.  ALLERGIES:  PENICILLIN, and questionably CONTRAST DYE which gives her a facial rash but no anaphylactic symptoms.  SOCIAL HISTORY:  She has never smoked.  FAMILY HISTORY:  Noncontributory.  REVIEW OF SYSTEMS:  As per history of present illness.  PHYSICAL EXAMINATION:  GENERAL:  She is an obese white female who is in mild respiratory distress but does not have increased work of breathing.  VITAL SIGNS:  Blood pressure 127/86, pulse 120, respiratory rate 28, O2 saturation on 3 liters is 95%.  She is afebrile.  HEENT:  Pupils are equal, round and reactive to light and accommodation. Extraocular muscles are intact.  Nares are patent without discharge. Oropharynx is clear.  NECK:  Supple without JVD or lymphadenopathy.  There is no palpable thyromegaly.  CHEST:  Totally clear with very prominent upper airway  pseudowheezing that is transmitted to the bases.  This resolves with pursed-lip breathing.  CARDIAC:  Shows a tachycardic regular rhythm with no murmurs, rubs, or gallops.  ABDOMEN:  Soft, nontender, nondistended, with good bowel sounds.  GENITAL, RECTAL, BREAST:  Not done and not indicated.  EXTREMITIES:  Lower extremities show trace edema with her right leg being larger than her left.  There is some calf tenderness on the right.  Pulses are intact distally but diminished.  NEUROLOGIC:  She is alert and oriented x 3 and no obvious motor deficits.  LABORATORY DATA:  Chest x-ray  as per history of present illness.  Arterial blood gases per history of present illness.  She had a glucose of 188, BUN 9, sodium 142, potassium 3.8, chloride 106, bicarbonate 29, creatinine 1.0.  White count 14,800 with hemoglobin 14, platelet count was normal.  IMPRESSION:  Progressive dyspnea, now with hypoxemia and acute and chronic hypercarbia.  I certainly think that given her debility and her immobility post CABG and this progressive shortness of breath that we need to rule out pulmonary embolus.  If this is negative I suspect this is all due to deconditioning, anxiety, and obesity hypoventilation syndrome.  The patients chest film is very dark and certainly I cannot exclude some component of edema and therefore will check a BNP for completeness.  PLAN: 1. Check spiral CT to rule out PE. 2. Check BNP. 3. Supplemental oxygen. 4. Early mobility and incentive spirometry. Dictated by:   Barbaraann Share, M.D. [facility] Attending Physician:  Tressie Stalker DD:  11/28/01 TD:  11/29/01 Job: [id number]

## 2011-03-23 NOTE — Consult Note (Signed)
NAMEEDISON, WOLLSCHLAGER                         ACCOUNT NO.:  [id number]   MEDICAL RECORD NO.:  [id number]                   PATIENT TYPE:  INP   LOCATION:  5033                                 FACILITY:  [facility]   PHYSICIAN:  Artist Pais. Mina Marble, M.D.           DATE OF BIRTH:  1929/04/23   DATE OF CONSULTATION:  06/28/2003  DATE OF DISCHARGE:                                   CONSULTATION   REFERRING PHYSICIAN:  Dyke Brackett, M.D.   REASON FOR CONSULTATION:  Mrs. Addaline Peplinski is a 77 year old female who  tripped and fell in the walkway earlier on Sunday evening/early Monday  morning and fell with injuries to both of her arms and her face.  There was  no loss of consciousness and complained of right elbow pain and left wrist  pain.   PAST MEDICAL HISTORY:  1. Significant for coronary artery disease.  She had myocardial infarcts and     bypass surgery in the past.  2. Hypertension.  3. Cataracts.  4. Anxiety.  5. Osteoporosis.   FAMILY HISTORY:  Significant for cerebrovascular accident that her father  died of.   PAST SURGICAL HISTORY:  1. Significant for cholecystectomy.  2. CABG.  3. Hysterectomy.  4. Coronary stents.  5. Hemorrhoidectomy.  6. Most recently, pulmonary emboli.   SOCIAL HISTORY:  She does not smoke or drink.   MEDICATIONS:  Include Coumadin, Norvasc, Diovan, Advair, Ambien, albuterol  and hydrochlorothiazide.   ALLERGIES:  She has an allergy to PENICILLIN, TOPROL and LASIX.   PHYSICAL EXAMINATION:  NEUROLOGIC:  Reveals neurovascularly intact right and  left upper extremities.  She has a well-padded volar splint on the left and  a well-padded elbow splint on the right.  She is neurovascularly intact  grossly with median, radial and ulnar function and 2+ radial pulses.  SKIN:  Intact and free of any signs of infection bilaterally.   LABORATORY DATA:  X-rays show an intra-articular distal radius fracture on  the left as well as a right  fracture/dislocation of the elbow, including  olecranon fracture, and possible radial head fracture/dislocation on the  right.   HISTORY:  Patient is a 77 year old female with an intra-articular fracture  and dislocation of the elbow on the right as well as intra-articular  fracture, distal radius, on the left.  I discussed with her, at this point  in time, recommendations would be to fix both.  With regards to the elbow,  we will plate the olecranon with possible radial head ORIF versus radial  head replacement and reconstruction of the lateral side if necessary as well  as an ORIF of the distal radius on the left using DVR plate and screws  through a volar approach.  All risks and benefits of the procedures were  explained to the patient.  She understands.  We will get her set up as soon  as possible for the previously mentioned  procedures with approval of her  medical physician, Dr. Alroy Dust and the trauma service.                                                Artist Pais Mina Marble, M.D.    MAW/MEDQ  D:  06/30/2003  T:  07/01/2003  Job:  [id number]

## 2011-03-23 NOTE — Discharge Summary (Signed)
NAMEWINFRED, [name]                         ACCOUNT NO.:  [id number]   MEDICAL RECORD NO.:  [id number]                   PATIENT TYPE:  INP   LOCATION:  4713                                 FACILITY:  [facility]   PHYSICIAN:  Willa Rough, M.D. [facility]              DATE OF BIRTH:  Mar 21, 1929   DATE OF ADMISSION:  09/02/2002  DATE OF DISCHARGE:  09/04/2002                           DISCHARGE SUMMARY - REFERRING   PROCEDURES:  1. Cardiac catheterization.  2. Coronary arteriogram.  3. Left ventriculogram.  4. Peripheral aortogram.  5. Graft angiogram.   HOSPITAL COURSE:  The patient is a 77 year old female with a history of  coronary artery disease who had single-vessel CABG to the LAD in October of  2002.  She was seen in the office on August 31, 2002, for recurrent chest  pain.  The patient had a Cardiolite performed prior to this office visit  which showed distal anterior and apical ischemia with a normal ejection  fraction.  It was felt that she needed cardiac catheterization to further  assist her anatomy and she was admitted to the hospital on September 02, 2002.   The cardiac catheterization showed a left foramen with a 30% lesion and the  LAD was totaled at the previous stent site.  There was an 80% stenosis and a  proximal septal perforator.  The ramus intermedius has 30% stenosis and the  circumflex had 30% stenosis in the OM-1 and the OM-2.  The RCA had a 30-40%  proximal and a 30% distal stenosis.  The left subclavian stent was widely  patent and the SVG to LAD was widely patent.  She had mild anterior  hypokinesis on the left ventriculogram with an EF of 55%.  Medical therapy  was recommended with resumption of Coumadin.  On September 04, 2002, she was  seen by Dr. Eden Emms who felt that her groin was stable and she was ambulating  without difficulty.  The patient was restarted on her Coumadin and  considered stable for discharge without cross covering or routine with  heparin  or Lovenox on September 04, 2002.   DISCHARGE CONDITION:  Stable.   DISCHARGE DIAGNOSES:  1. Chest pain with an abnormal Cardiolite, no critical disease by     catheterization this admission.  2. Status post saphenous vein graft to left anterior descending in October     of 2002.  3. Status post percutaneous transluminal coronary angioplasty and stent to     the left anterior descending (total by catheterization this admission).  4. History of left subclavian stent.  5. Hypertension.  6. Asthmatic bronchitis.  7. Hiatal hernia and gastroesophageal reflux disease.  8. Irritable bowel syndrome and diverticulosis.  9. Degenerative joint disease.  10.      Anxiety.  11.      Status post cholecystectomy, hysterectomy, hemorrhoid surgery, and     appendectomy.  12.  Hyperlipidemia.  13.      Obesity.  14.      History of volume overload (euvolemic this admission).  15.      History of pulmonary embolism in 2003.  16.      Chronic anticoagulation therapy for pulmonary embolism.  17.      History of allergy to IODINE, PENICILLIN, and TOPROL as well as     POTASSIUM and FUROSEMIDE.   DISCHARGE INSTRUCTIONS:  Her activity level is to include no driving, sexual  stimulus activity for two days.  She is to call the office for problems with  the cath site.  She is to follow up in the [facility] on November 3 and  she is to follow up with Dr. Myrtis Ser on November 17.  She is to follow up with  Dr. Kriste Basque as needed.   DISCHARGE MEDICATIONS:  1. Advair 500/50 b.i.d.  2. Tranxene 7.5 mg t.i.d.  3. Diovan 80 mg q.d.  4. Protonix 40 mg q.d.  5. Norvasc 5 mg q.d.  6. Lipitor 10 mg q.h.s.  7. Coumadin 5 mg Friday and Saturday and 2.5 mg q.d. starting Sunday.  8. Albuterol p.r.n.  9. Actonel 30 mg q. week.  10.      Oxycodone and nitroglycerin p.r.n.     [name], P.A. [facility]                  Willa Rough, M.D. [facility]    RG/MEDQ  D:  10/06/2002  T:  10/06/2002  Job:  [id number]   cc:    Willa Rough, M.D. [facility] M. Kriste Basque, M.D. [facility]

## 2011-03-23 NOTE — Op Note (Signed)
NAMEOTTO, CARAWAY                         ACCOUNT NO.:  [id number]   MEDICAL RECORD NO.:  [id number]                   PATIENT TYPE:  OIB   LOCATION:  5020                                 FACILITY:  [facility]   PHYSICIAN:  Artist Pais. Mina Marble, M.D.           DATE OF BIRTH:  1929/09/18   DATE OF PROCEDURE:  11/24/2003  DATE OF DISCHARGE:                                 OPERATIVE REPORT   PREOPERATIVE DIAGNOSES:  Right elbow complex fracture dislocation with  radial head fracture, olecranon fracture, status post open reduction and  internal fixation.   POSTOPERATIVE DIAGNOSES:  Right elbow complex fracture dislocation with  radial head fracture, olecranon fracture, status post open reduction and  internal fixation.   PROCEDURE:  1. Implant removal, right elbow.  2. Partial resection radial head.  3. Elbow capsulectomy.   SURGEON:  Artist Pais. Mina Marble, M.D.   ASSISTANT:  Aura Fey. Bobbe Medico.   ANESTHESIA:  General.   TOURNIQUET TIME:  1 hour 10 minutes.   COMPLICATIONS:  None.   DRAINS:  None.   DESCRIPTION OF PROCEDURE:  The patient was taken to the operating room.  After the induction of adequate general anesthesia, the right upper  extremity was prepped and draped in the usual sterile fashion.  An Esmarch  was used to exsanguinate the limb.  Tourniquet was inflated to 250 mmHg.  At  this point in time, a posterior incision was made using her old operative  incision in the right elbow.  Incision was taken down through the skin and  subcutaneous tissue.  The flaps were raised accordingly.  Dissection was  carried down to the olecranon process where the precontoured olecranon plat  was identified and removed.  At this point in time, a large lateral flap was  elevated.  The radial capitellar articulation was identified.  Dissection  was carried down through the extensor musculature.  The posterior  interosseus nerve was identified and retracted, and a capsulotomy was  performed.  The remaining radial head and neck area were transected using  the sagittal saw, and after this was done, a radial capitellar and  ulnohumeral capsulectomy was performed.  Capsulotomy was performed through  this incision.  The wound was then thoroughly irrigated.  Intraoperative x-  rays showed good placement of the resected tissue, and the patient's wound  was then irrigated.  Hemostasis was achieved with bipolar cautery.  It was  closed  in layers with 0 Vicryl to reconstruct the capsule overlying the  greater capitellar joint including the annular ligament followed by 2-0  Vicryl on the subcutaneous tissues and staples on the skin.  The patient was  placed in a sterile dressing with Xeroform, 4 x 4 and a posterior splint.  The patient tolerated the procedure well and went to recovery in stable  fashion.  Artist Pais Mina Marble, M.D.    MAW/MEDQ  D:  11/24/2003  T:  11/24/2003  Job:  [id number]

## 2011-03-23 NOTE — H&P (Signed)
[name], [name]                         ACCOUNT NO.:  [id number]   MEDICAL RECORD NO.:  [id number]                   PATIENT TYPE:  INP   LOCATION:  4713                                 FACILITY:  [facility]   PHYSICIAN:  Lonzo Cloud. Kriste Basque, M.D. [facility]            DATE OF BIRTH:  1928/11/18   DATE OF ADMISSION:  02/08/2003  DATE OF DISCHARGE:                                HISTORY & PHYSICAL   CHIEF COMPLAINT:  Unsteady gait, dizziness, nausea, and headache.   HISTORY OF PRESENT ILLNESS:  The patient is a 77 year old white female  patient of Dr. Alroy Dust and of Dr. Willa Rough of cardiology who  presents to the office today with the aforementioned complaints.  She has  proven refractory to outpatient treatment for bronchitis and vertigo with  multiple visits to Dr. Alroy Dust and to Devra Dopp, Nurse Practitioner  and to Rubye Oaks, Nurse Practitioner.  Due to the refractory nature of  her complaints and the fact that she has fallen on several occasions while  on Coumadin for history of PE, she will be admitted for further evaluation  and treatment.   PAST MEDICAL HISTORY:  1. Pulmonary emboli in 2003 for which she is on Coumadin therapy.  2. Coronary artery disease with history of one-vessel coronary artery bypass     grafting to the LAD on 08/06/2001.  Cardiolite study demonstrated distal,     apical, and anterior ischemia with normal EF.  3. Anxiety.  4. Hyperlipidemia.  5. Morbid obesity.  6. Peripheral vascular disease status post left subclavian stent.  7. Gastroesophageal reflux disease.  8. Hypertension.  9. Hiatal hernia.  10.      Degenerative joint disease.  11.      Osteoarthritis.  12.      History of hysterectomy.   ALLERGIES:  IODINE, PENICILLIN, TOPROL, LASIX, AND KCL.   MEDICATIONS:  1. Advair 500/50 one puff b.i.d.  2. Clorazepate 7.5 mg t.i.d.  3. Coumadin, controlled by pharmacy.  4. Diovan 80 mg 1 daily.  5. Protonix 40 mg b.i.d.  6. Norvasc 10  mg daily.  7. Lipitor 10 mg daily.  8. Ambien 10 mg q.h.s.  9. Albuterol 2.5 hand-held nebulizer q.6h. p.r.n.  10.      Nitroglycerin sublingual 0.4 mg every 5 minutes as needed.   SOCIAL HISTORY:  She has never smoked, lives alone.   FAMILY HISTORY:  Noncontributory.   REVIEW OF SYSTEMS:  See HPI.   PHYSICAL EXAMINATION:  VITAL SIGNS: Blood pressure 164/100, pulse 87,  respirations 20, temperature 97.8, O2 saturation 96% on room air.  GENERAL:  Morbidly obese white female in no acute distress.  HEENT.  Tympanic membranes have impacted cerumen on the right, left tympanic  membranes clear.  Oropharynx unremarkable.  No oral cobblestoning is  appreciated.  NECK:  No JVD is appreciated.  CHEST:  Clear to auscultation without wheeze.  CARDIAC:  Heart sounds regular with regular rate and rhythm.  ABDOMEN:  Obese, soft, nontender.  Positive bowel sounds.  GENITOURINARY:  Not observed.  EXTREMITIES: Warm without edema.   LABORATORY DATA:  Labs are pending.   Chest x-ray pending.   IMPRESSION:  1. Failure to thrive. Will admit for consideration of case manager and     evaluation for nursing home placement.  2. Dizziness, questionable etiology, refractory to meclizine.  Will admit.     Will have cardiac and neurologic evaluation.  3. History of coronary artery disease status post coronary artery bypass     grafting.  She will have cardiac enzymes checked, 12-lead EKG, BNP, and 2-     D echocardiogram if needed.  4. Bronchitis.  Treat with IV Tequin .  5. Gastroesophageal reflux disease.  She will be on proton pump inhibitor     b.i.d.  6. Hypertension. Continue current antihypertensives.  7. Anxiety.  She will be on angiolytics as needed and admitted to hospital     for further evaluation and treatment.     Brett Canales Minor, A.C.N.P. [facility]                 Scott M. Kriste Basque, M.D. [facility]    SM/MEDQ  D:  02/08/2003  T:  02/08/2003  Job:  [id number]

## 2011-03-23 NOTE — Procedures (Signed)
[facility]  Patient:    [name], [name] Visit Number: [id number] MRN: [id number]          Service Type: MED Location: 2000 2031 01 Attending Physician:  Talitha Givens Dictated by:   Veneda Melter, M.D. [facility] Proc. Date: 09/02/01 Admit Date:  08/28/2001   CC:         Luis Abed, M.D. [facility]             Scott M. Kriste Basque, M.D. [facility]             Clarence H. Cornelius Moras, M.D.                           Procedure Report  PROCEDURES: 1. Left subclavian angiogram. 2. Percutaneous transluminal angioplasty and stenting of the left subclavian    artery. 3. Perclose right femoral artery.  DIAGNOSES: 1. Peripheral vascular disease. 2. Left upper extremity claudication. 3. Coronary artery disease. 4. Unstable angina.  HISTORY:  [name] is a 77 year old white female with severe single-vessel coronary artery disease involving the LAD.  The patient has well-preserved LV function.  She presented to the hospital with unstable angina and was found to have recurrent stenosis within the areas of previous stenting.  She will require single-vessel bypass CABF with placement of LIMA graft to the LAD. Unfortunately, the patient was found to have severe narrowing of 70-80% in the proximal left subclavian artery.  She presents now.  The patient has had a history of left upper extremity claudication.  Due to need for surgery as well as symptoms, the patient presents for percutaneous intervention of the left subclavian artery.  DESCRIPTION OF PROCEDURE:  Informed consent was obtained.  Patient brought to the catheterization lab and a 7 French sheath placed in the right femoral artery.  The patient was given 5000 units of heparin.  A JR4 diagnostic catheter was then used to engage the left subclavian artery and selective guide shots obtained using manual injections of contrast.  A long Wholey wire was then advanced through the distal subclavian artery into the  axillary vessel and a long 7 French sheath positioned across the stenosis.  A 7 x 4 Powerflex balloon was then introduced and used to predilate the lesion at eight atmospheres for 30 seconds.  Repeat angiography showed improvement in vessel lumen, although there was residual disease of greater than 50%.  A P294 stent was then hand-crimped on the 7 x 4 Powerflex balloon and then carefully positioned under cineangiography in the proximal left subclavian artery, extending from the origin up to the vertebral artery.  This was deployed at 6 atmospheres for 30 seconds.  An 8 x 2 Powerflex balloon was then used to postdilate the stent.  Two inflations were performed at 10 atmospheres for 30 seconds in the distal and proximal segments.  Repeat angiography was then performed showing an excellent result with no residual stenosis.  There was mild residual disease of 50% between the vertebral and the internal mammary artery, which appeared noncritical in nature.  An end-hole catheter was introduced and a transstenotic gradient performed across this lesion, which was 0 mmHg, and into the aorta, with less than 10 mmHg starting from the subclavian artery distal to the LIMA takeoff.  This was deemed an acceptable result.  Final angiography was performed in various projections, confirming no distal vessel damage and excellent flow through the subclavian artery.  The guide catheter was then  removed and a Perclose suture closure device deployed to the right femoral artery until adequate hemostasis was achieved.  The patient tolerated the procedure well and was transferred to the floor in stable condition.  RESULTS:  Successful PTA and stenting of the left subclavian artery with reduction of 80% narrowing to 0%, with placement of a P294 stent dilated to 8 mm. Dictated by:   Veneda Melter, M.D. [facility] Attending Physician:  Talitha Givens DD:  09/02/01 TD:  09/03/01 Job: [id number]

## 2011-03-23 NOTE — Discharge Summary (Signed)
NAMEADDISSON, FRATE                         ACCOUNT NO.:  [id number]   MEDICAL RECORD NO.:  [id number]                   PATIENT TYPE:  INP   LOCATION:  4713                                 FACILITY:  [facility]   PHYSICIAN:  Rollene Rotunda, M.D. [facility]            DATE OF BIRTH:  1928-11-11   DATE OF ADMISSION:  08/27/2002  DATE OF DISCHARGE:  08/28/2002                           DISCHARGE SUMMARY - REFERRING   BRIEF HISTORY:  This is a pleasant 76 year old female with a history of  coronary artery disease who was admitted to [facility] by Dr.  Antoine Poche after she developed chest pain while in cardiac rehabilitation. She  was admitted for further evaluation of her symptoms.   PAST MEDICAL HISTORY:  The patient does have a history of coronary artery  disease.  She is status post stent to the LAD in January 2002 with apparent  restenosis and repeat pecutaneous intervention in February 2002.  She had a  restenosis and subsequent coronary artery bypass graft surgery in October  2002.  The patient has a history of peripheral vascular disease. She is  status post stenting of her left subclavian prior to CABG in October 2002.  She has a history of pulmonary embolus in January 2003 and is on chronic  Coumadin therapy. She has a history of hyperlipidemia, hypertension,  irritable bowel syndrome, anxiety, gastroesophageal reflux disease, hiatal  hernia, DJD, and osteoarthritis.  She is status post cholecystectomy, status  post coronary artery bypass graft surgery with LIMA to LAD in October 2002.  Status post appendectomy and hysterectomy.   ALLERGIES:  PENICILLIN.  She also developed a rash while taking BETA  BLOCKERS, LASIX, and POTASSIUM.  She feels she may be allergic to one of  these medicines.   CURRENT MEDICATIONS AT TIME OF ADMISSION:  1. Diovan 80 mg daily.  2. Norvasc 5 mg daily.  3. Prilosec 20 mg daily.  4. Lipitor 10 mg daily.  5. Clorazepate 7.5 mg t.i.d.  6. Ambien 10  mg q.h.s.  7. Actonel 30 mg weekly.  8. Albuterol inhaler.  9. Advair 5/500 mcg.  10.      Coumadin.   SOCIAL HISTORY:  The patient lives in [place].  She is divorced.  She is  retired.  She does not smoke cigarettes.   HOSPITAL COURSE:  As noted, this patient was admitted to [facility]  for further evaluation of chest pain that occurred while in cardiac  rehabilitation.  She ruled out for an MI with negative enzymes.  The plan  was for her to be discharged from the hospital the following day with an  outpatient Cardiolite to be performed at the [facility] office.  This could not  be scheduled until Monday, October 27.   LABORATORY DATA:  As noted, the patient had negative cardiac enzymes.  A  comprehensive metabolic panel was within normal limits except for an albumin  of  3.3.  INR on admission was 2.3.  CBC was within normal limits.   EKG showed normal sinus rhythm with an old septal infarct.   DISCHARGE MEDICATIONS:  The patient was told to continue the medications she  was on prior to admission.  Please see the list as noted above.  The  Coumadin dose is uncertain.   ACTIVITY:  The patient was told to avoid any strenuous activity until after  her stress test.   DIET:  Low-salt, low-fat diet.   SPECIAL INSTRUCTIONS:  The patient was to have an adenosine Cardiolite  performed Monday, October 27, at the [place] office.  She as to be there at  11:45 A.M.  She was told she could eat breakfast before 8 a.m. but no  caffeine and no decaffeinated coffee.   FOLLOW UP:  The patient is to follow up with D. Myrtis Ser in two to four weeks,  Dr. Kriste Basque as needed or as scheduled.   PROBLEM LIST:  1. Chest pain with negative cardiac enzymes.  2. History of gastroesophageal reflux disease and esophageal stricture.  3. History of coronary artery bypass graft surgery in January 2002 with a     left internal mammary artery to the left anterior descending artery.  4. History of multiple  previous percutaneous interventions as noted above.  5. History of peripheral vascular disease as noted above.  6. History of pulmonary embolus.  7. Coumadin therapy.  8. Hyperlipidemia.  9. Hypertension.  10.      Irritable bowel syndrome.  11.      Anxiety.  12.      Gastroesophageal reflux disease with hiatal hernia.  13.      Degenerative joint disease.  14.      Osteoarthritis.     Delton See, P.A. [facility]                  Rollene Rotunda, M.D. [facility]    DR/MEDQ  D:  08/28/2002  T:  08/28/2002  Job:  [id number]   cc:   Lonzo Cloud. Kriste Basque, M.D. [facility]

## 2011-03-23 NOTE — Discharge Summary (Signed)
[place]  Patient:    [name], [name] Visit Number: [id number] MRN: [id number]          Service Type: MED Location: 2000 2027 01 Attending Physician:  Tressie Stalker Dictated by:   Durenda Age, P.A.-C. Admit Date:  08/28/2001 Disc. Date: 09/09/01                             Discharge Summary  NO ATTENDING GIVEN  DIAGNOSES: 1. Coronary artery disease, rule out myocardial infarction, secondary to    unstable angina on admission. 2. Status post percutaneous transluminal coronary angioplasty and sent to the    left anterior descending in January 2002. 3. History of asthmatic bronchitis. 4. Degenerative joint disease. 5. History of hernia - gastroesophageal reflux disease - esophageal stricture    dilatation. 6. Previous history of cholecystectomy, hysterectomy and appendectomy. 7. Hypertension. 8. Hyperlipidemia. 9. History of left subclavian stenosis.  PROCEDURES: 1. Status post cardiac catheterization on August 29, 2001. 2. Status post left subclavian vein stent on September 02, 2001. 3. Status post CABG x 1 on September 04, 2001, with SVG to the LAD.  CONDITION ON DISCHARGE:  Improved.  FOLLOWUP:  For staple removal the following week, the patient will be evaluated by Dr. Cornelius Moras in three weeks after discharge and by Dr. Myrtis Ser for [facility] in two weeks after discharge.  MEDICATIONS ON DISCHARGE: 1. Aspirin 325 mg p.o. q.d. 2. Lopressor 25 mg p.o. b.i.d. 3. Plavix 75 mg p.o. q.d. 4. Zocor 20 mg p.o. q.h.s. 5. Evista 60 mg p.o. q.d. 7. Flovent two puffs b.i.d. 8. Prilosec 20 mg p.o. b.i.d. 9. Tylox p.r.n.  HOSPITAL COURSE:  The patient is a 77 year old obese white female followed by Dr. Alroy Dust and Dr. Willa Rough, who was referred by Dr. Gerri Spore from [facility] for management of coronary artery disease.  The patient has a history of previous myocardial infarction, for which she underwent PTCA of the LAD  in March 2002.  The patient now presented with recurrent symptoms of angina and catheterization demonstrating occlusion of the LAD at the site of previous angioplasty and stent placement.  Left ventricular function is preserved.  The patient was also noted to have high-grade proximal stenosis of the left subclavian artery.  She subsequently underwent PTCA and stent placement of the left subclavian artery by Dr. Chales Abrahams on September 02, 2001.  The patient now presented for elective surgical coronary revascularization.  On September 04, 2001, the patient underwent CABG x 1 with SVG to the LAD.  The patient tolerated the procedure well.  Of note, the LIMA was harvested, but it was not usable due to intimal dissection, very small size and poor antegrade flow.  The course of her hospitalization was unremarkable.  During the following days she demonstrated slow, but steady progress.  On POD #4, her overall status is improved, she denies any significant shortness of breath or cardiac pain.  Her vital signs are stable, she is afebrile, she is approaching her preoperative weight of 190, and her physical exam is unremarkable.  Her incisions are clean and with very mild drainage, which is not appearing to be infected.  It is predicted that the patient will be discharged in stable condition on September 09, 2001. Dictated by:   Durenda Age, P.A.-C. Attending Physician:  Tressie Stalker DD:  09/08/01 TD:  09/08/01 Job: [id number]

## 2011-03-23 NOTE — Discharge Summary (Signed)
NAMEARYAM, ZHAN                         ACCOUNT NO.:  [id number]   MEDICAL RECORD NO.:  [id number]                   PATIENT TYPE:  OIB   LOCATION:  5005                                 FACILITY:  [facility]   PHYSICIAN:  Artist Pais. Mina Marble, M.D.           DATE OF BIRTH:  1929-10-10   DATE OF ADMISSION:  12/01/2003  DATE OF DISCHARGE:  12/04/2003                                 DISCHARGE SUMMARY   ADMISSION DIAGNOSIS:  Refracture of olecranon on the right elbow for a  previous fracture dislocation of the right elbow.   OPERATIONS AND PROCEDURES:  Open reduction, internal fixation right  olecranon fracture.   CONSULTATIONS:  1. Physical therapy.  2. Care management.   BRIEF HISTORY:  The patient is a 77 year old white female, well known to our  office, with a history of coronary artery disease, status post CABG in 2002,  non-insulin-dependent diabetes mellitus, and chronic Coumadin therapy for  pulmonary embolism history who was admitted on December 01, 2003, for ORIF of  a right refracture of an olecranon fracture.  She fractured her elbow with  fracture dislocation in August and underwent ORIF.  She subsequently  underwent a removal of hardware followed by a radial head resection.  After  this surgery, she went home, had a splint in place and fell landing directly  on her elbow.  She came back in our office and had obvious deformity,  swelling, pain and discomfort.  X-rays were obtained which showed refracture  of the olecranon fracture.  Arrangements were made for repeat ORIF of this  refracture.   The first day postoperatively vital signs were stable.  The dressing was dry  and intact.  Social services were contacted regarding discharge plans.  She  was restarted on Coumadin.  She was accepted for admission to subacute care  unit for December 04, 2003.   DISCHARGE CONDITION:  Stable, improved.   DISCHARGE INSTRUCTIONS:  Discharge to subacute care unit for occupational  therapy, physical therapy until she is able to transfer safely and then  discharged home.  She is to follow up in our office after she is discharged  home.  We will follow her during her SACU visit, take her dressing off and  place her in an Orthoplast long arm splint with follow up of her routine in  our office after discharge from [facility].      Aura Fey Bobbe Medico.                       Artist Pais Mina Marble, M.D.    SCI/MEDQ  D:  01/20/2004  T:  01/24/2004  Job:  [id number]   cc:   Artist Pais Mina Marble, M.D.  [address]  [place]  [place] [address]  Fax: [phone]

## 2011-03-23 NOTE — Op Note (Signed)
NAMEJAX, ABDELRAHMAN                         ACCOUNT NO.:  [id number]   MEDICAL RECORD NO.:  [id number]                   PATIENT TYPE:  INP   LOCATION:  5033                                 FACILITY:  [facility]   PHYSICIAN:  Artist Pais. Mina Marble, M.D.           DATE OF BIRTH:  1928/11/07   DATE OF PROCEDURE:  06/30/2003  DATE OF DISCHARGE:                                 OPERATIVE REPORT   PREOPERATIVE DIAGNOSIS:  Left intra-articular distal radius fracture, right  olecranon fracture, right radial head fracture.   POSTOPERATIVE DIAGNOSIS:  Left intra-articular distal radius fracture, right  olecranon fracture, right radial head fracture.   PROCEDURE:  Open reduction and internal fixation left distal radius  fracture, DDR plate and screws, open reduction and internal fixation, right  olecranon fracture and radial head excision, right elbow.   SURGEON:  Artist Pais. Weingold, M.D.   ASSISTANT:   ANESTHESIA:  General.   TOURNIQUET TIME:  40 minutes on the left side, 1 hour 20 minutes on the  right side.   OPERATIVE REPORT:  The patient was taken to the operating room, with the  induction of adequate general anesthesia, the right and left upper  extremities were prepped and draped in the usual sterile fashion.  An  Esmarch was used to exsanguinate the left upper extremity and the tourniquet  was then inflated to 250 mmHg.  At this point in time, a volar approach to  the distal radius was undertaken.  The incision was taken down over the  palpable border of the flexor carpi radialis tendon.  The FCR tendon sheath  was incised, it was retracted in the midline, the radial artery to the  lateral side.  The interval was developed, the pronator quadratus was  identified, the pronator quadratus was subperiosteally dissected off the  distal radius.  The distal radius was then reduced and the DDR plate was  placed under direct vision and fluoroscopic control.  Adequate reduction in  both  the AP, lateral, and oblique view was shown and the screws and hardware  were placed appropriately.  The wound was thoroughly irrigated and closed  with a running 3-0 Prolene subcuticular stitch.  Steri-Strips, 4 by 4s,  fluffs, and compressive dressing and volar splint was applied.  The  tourniquet was deflated.   The right upper extremity was then exsanguinated using the tourniquet and  the tourniquet was inflated to 250 mmHg on the right side.  At this point in  time, a standard posterior incision to the elbow was undertaken, flaps were  raised accordingly.  The olecranon fracture was identified subperiosteally  and fixed with the Acumed congruent long olecranon plate with screws  proximally and distally spanning the fracture site.  The lag screws were  also placed into two large fragments.  The wound was then thoroughly  irrigated.  A separate incision was made and the radial head was excised  through  a small window in the extensor musculature.  The wound was then  thoroughly irrigated and  closed with 0 Vicryl, 2-0 Vicryl, and staples on the skin.  The patient was  placed in a sterile dressing of Xeroform, 4 by 4s, fluffs, and a posterior  splint.  The patient tolerated the procedures bilaterally very well and went  to the recovery room in stable condition.                                               Artist Pais Mina Marble, M.D.    MAW/MEDQ  D:  06/30/2003  T:  06/30/2003  Job:  [id number]

## 2011-03-23 NOTE — H&P (Signed)
[name], HEDEEN                         ACCOUNT NO.:  [id number]   MEDICAL RECORD NO.:  [id number]                   PATIENT TYPE:  INP   LOCATION:  1825                                 FACILITY:  [facility]   PHYSICIAN:  Rollene Rotunda, M.D. [facility]            DATE OF BIRTH:  1929/10/29   DATE OF ADMISSION:  08/27/2002  DATE OF DISCHARGE:                                HISTORY & PHYSICAL   PRIMARY CARE PHYSICIAN:  Dr. Lonzo Cloud. Nadel.   CARDIOLOGIST:  Dr. Willa Rough.   REASON FOR PRESENTATION:  Evaluate patient with chest pain.   HISTORY OF PRESENT ILLNESS:  The patient is a 77 year old with a history of  coronary disease as described below.  She was feeling well today and was  going to cardiac rehab.  Sitting in the rehab waiting room, she developed a  watery taste in her mouth.  This was followed by discomfort that started in  her upper epigastric area and radiated up her chest and into her neck.  She  said this was similar to previous anginal pain, although she also says that  it is similar to pain she has gotten in the past with anxiety attacks or  with reflux.  She took two nitroglycerin without significant improvement,  although eventually, after the second nitroglycerin, the pain did ease.  She  presented to the emergency room, where she has been pain-free.  She did feel  jittery and weak.  She did not have vomiting.  There was no reported  diaphoresis.  She had no radiation of the discomfort to her back or arms,  which had occurred with previous angina.  She denies any shortness of  breath.  There are no palpitations.  She is having no presyncope or syncope.   PAST MEDICAL HISTORY:  Coronary artery disease, status post stent to the LAD  in January of 2002, with apparent restenosis and repeat percutaneous  intervention in February of 2002 (she had restenosis and subsequent CABG in  October 2002), peripheral vascular disease, status post stenting of her left  subclavian  prior to CABG in October 2002, pulmonary embolism, January 2003,  with resultant chronic Coumadin therapy, hyperlipidemia, hypertension,  irritable bowel syndrome, anxiety, gastroesophageal reflux disease with  hiatal hernia, degenerative joint disease, osteoarthritis.   PAST SURGICAL HISTORY:  Cholecystectomy, CABG (LIMA to the LAD, October  2002), appendectomy, hysterectomy.   ALLERGIES:  PENICILLIN.  (The patient also developed a rash while taking  BETA BLOCKERS, LASIX and POTASSIUM and thinks she may be allergic to one of  these medicines.)   MEDICATIONS:  1. Diovan 80 mg every day.  2. Norvasc 5 mg every day.  3. Prilosec 20 mg every day.  4. Lipitor 10 mg every day.  5. Clorazepate 7.5 mg t.i.d.  6. Ambien 10 mg q.h.s.  7. Actonel 30 mg every week.  8. Albuterol.  9. Advair 5/500 mcg.  10.  Coumadin.   SOCIAL HISTORY:  The patient lives in [place].  She is divorced.  She is  retired.  She does not smoke cigarettes.   FAMILY HISTORY:  Family history is contributory for her sister having early-  onset coronary artery disease.   REVIEW OF SYSTEMS:  Review of systems as stated in the HPI and positive for  occasional anxiety, dysphagia, occasional musculoskeletal chest discomfort  since her surgery, recent steroid treatment for exacerbation of shortness of  breath.  Otherwise, as stated in the HPI and negative for other systems.   PHYSICAL EXAMINATION:  GENERAL:  The patient is in no distress.  VITAL SIGNS:  Blood pressure 136/62, heart rate 70 and regular, afebrile.  HEENT:  Eyes unremarkable; pupils are equal, round and reactive to light;  fundi not visualized.  Oral mucosa unremarkable.  NECK:  No jugular venous distention; wave form within normal limits; carotid  upstroke brisk and symmetric; no bruits; no thyromegaly.  LYMPHATICS:  No cervical, axillary or inguinal adenopathy.  LUNGS:  Lungs are clear to auscultation bilaterally.  BACK:  No costovertebral angle  tenderness.  CHEST:  Well-healed sternotomy scar.  HEART:  PMI not displaced or sustained; S1 and S2 within normal limits; no  S3, no S4, no murmurs.  ABDOMEN:  Abdomen obese; positive bowel sounds, normal in frequency and  pitch; no rebound, guarding,  or midline pulsatile mass; no hepatomegaly, or  splenomegaly.  SKIN:  No rash, no nodules.  EXTREMITIES:  Pulses 2+ throughout.  No edema.  NEUROLOGIC:  Oriented to person, place and time.  Cranial nerves II-XII  grossly intact.  Motor grossly intact.   LABORATORY AND ACCESSORY CLINICAL DATA:  EKG:  Sinus rhythm, rate 64, axis  within normal limits, first-degree A-V block, possible old anteroseptal  myocardial infarction, no acute ST-T wave changes.   ASSESSMENT AND PLAN:  1. Chest discomfort:  The patient's chest discomfort has atypical greater     than typical features.  There is no objective evidence of ischemia.  She     does report that it is somewhat reminiscent of previous angina.  At this     point, the plan will be to observe her and rule out myocardial     infarction.  If her enzymes are negative, she will be sent for an     outpatient stress perfusion study in our office tomorrow.  2. Hypertension:  Her blood pressure medications will be continued as     listed.  3. Deep venous thrombosis/pulmonary emboli:  The patient will continue on     Coumadin and we will make sure she has therapeutic levels.  4. Hyperlipidemia:  She will be continued on Lipitor.                                               Rollene Rotunda, M.D. [facility]    JH/MEDQ  D:  08/27/2002  T:  08/27/2002  Job:  [id number]   cc:   Lonzo Cloud. Kriste Basque, M.D. [facility]

## 2011-03-23 NOTE — Discharge Summary (Signed)
[facility]  Patient:    [name], [name] Visit Number: [id number] MRN: [id number]          Service Type: MED Location: [phone] Attending Physician:  Verdene Lennert Dictated by:   Lonzo Cloud. Kriste Basque, M.D. [facility] Admit Date:  11/28/2001 Disc. Date: 12/09/01   CC:         Luis Abed, M.D. [facility]   Discharge Summary  FINAL DIAGNOSES:  1. Admitted November 28, 2001 with progressive dyspnea secondary to multiple     pulmonary emboli.  2. Coronary artery disease with previous percutaneous transluminal coronary     angiography in the left anterior descending and subsequent coronary artery     bypass graft x1 to the left anterior descending.  3. Arteriosclerotic peripheral vascular disease with left subclavian stent by     Dr. Chales Abrahams for left arm claudication.  4. History of hypertension, controlled on medications.  5. History of asthmatic bronchitis with recent exacerbation.  6. History of hiatus hernia, gastroesophageal reflux disease, and stricture     with dilatation.  7. Irritable bowel syndrome, diverticulosis, and history of diarrhea with     colonoscopy 1998.  8. Degenerative arthritis and osteoporosis.  9. History of anxiety. 10. Status post cholecystectomy, hysterectomy, hemorrhoid surgery, and     appendectomy.  BRIEF HISTORY AND PHYSICAL:  Patient is a 77 year old white female with a history of coronary artery disease and recent hospitalization for chest pain and coronary artery bypass grafting in November 2002.  Since that time she has had some shortness of breath with negative cardiac and pulmonary evaluations. She was unable to participate in cardiac rehabilitation and had more difficulty taking care of herself at home.  She presented to the emergency room on November 28, 2001 with progressive dyspnea and was found to be hypoxemic.  A spiral CT done with allergy prophylaxis due to history of dye allergy revealed evidence of  multiple pulmonary emboli and she was admitted for heparin and Coumadin.  PAST MEDICAL HISTORY:  As noted, she has history of arteriosclerotic heart disease with previous myocardial infarction and a PTCA with stent in the past. She had hospitalization in November with coronary artery bypass grafting x1 (vein graft) because of recurrent disease and there was no other option for this LAD lesion.  She also has arteriosclerotic peripheral vascular disease with left subclavian stent for left arm claudication placed by Dr. Chales Abrahams.  She has a history of hypertension controlled on medications.  She has a history of asthmatic bronchitis with a recent exacerbation that had improved on therapy. She had a postoperative left effusion which had resolved.  She had a history of gastroesophageal reflux disease, moderate sized hiatus hernia, and a stricture with dilatation in 1998 by Dr. Marina Goodell.  She had a history of irritable bowel syndrome, diverticulosis, and diarrhea.  Her last colonoscopy was 1998 which revealed some diverticulosis and hemorrhoids.  She has a history of arthritis and osteoporosis.  She has a history of stress and lives alone.  She has history of previous cholecystectomy, hysterectomy, hemorrhoid surgery, and appendectomy.  MEDICATIONS:  Prilosec 20 mg p.o. b.i.d., Lasix 40 mg p.o. q.d., Norvasc 5 mg p.o. q.d., Serevent two sprays b.i.d., Flovent 220 two sprays b.i.d. (these were subsequently changed to Advair 500 one inhalation b.i.d.), Evista 60 mg p.o. q.d., Lipitor 10 mg p.o. q.d., 81 mg aspirin q.d., Plavix 75 mg p.o. q.d., clorazepate 7.5 mg p.o. t.i.d. p.r.n.  PHYSICAL EXAMINATION  GENERAL:  Dyspneic 77 year old white female in mild distress from shortness of breath.  VITAL SIGNS:  Blood pressure 124/80, pulse 94 per minute and regular, respirations 26 per minute and somewhat shallow, temperature 98.8, O2 saturation 80% on room air in the emergency room with pH 7.31, pCO2  54.  HEENT:  Unremarkable.  NECK:  No jugular venous distention.  No carotid bruits.  No thyromegaly, lymphadenopathy.  CHEST:  Decrease in breath sounds bilaterally with a few scattered rhonchi. No wheezes, rales, or signs of consolidation.  CARDIAC:  Regular rate and rhythm.  Grade 1/6 systolic ejection murmur left sternal border.  No rubs or gallops heard.  Scar recent median sternotomy surgery.  ABDOMEN:  Soft and nontender without evidence of organomegaly or masses.  She is somewhat obese.  RECTAL:  Deferred.  EXTREMITIES:  No cyanosis or clubbing.  There was trace peripheral edema and some venous insufficiency.  NEUROLOGIC:  Intact without focal abnormalities detected.  LABORATORY DATA:  EKG showed sinus tachycardia, rightward axis, interventricular conduction delay, and nonspecific ST-T wave changes.  There was a first degree AV block noted.  CT chest in the emergency room with spiral protocol and premedication for previous dye allergy showed evidence of large bilateral pulmonary emboli.  There appeared to be a left upper lobe infarct, evidence of previous cardiac surgery, and left subclavian stent noted. Evaluation of the lower extremities showed a DVT in the right superficial femoral vein.  This study was in contrast to a scan done on November 14, 2001 as an outpatient that did not use contrast that showed no CT evidence for significant fibrotic disease or infiltrates.  That study had been done without contrast because of her history of dye allergy.  Plain chest x-ray showed some cardiomegaly, no signs of congestive heart failure.  Patient did require a PICC line insertion this admission for heparin and this was placed with fluoro.  Hemoglobin 14.8, hematocrit 43.8, white count 14,900 with 87% segs.  Serial CBCs are followed throughout hospital course.  Hemoglobin 12.4, hematocrit 36.2, white count 8300 by discharge.  Protime 13.5, INR 1.0, PTT 28 seconds  on admission.  D-dimer assay was elevated at 10.1.  Patient was started on  heparin immediately and PTTs and protimes for coumadinization were followed by pharmacy protocol.  Sodium 142, potassium 3.8, chloride 106, CO2 27, BUN 9, creatinine 1.0, blood sugar 188.  Repeat blood sugar 135.  Calcium 8.6, total protein 5.7, albumin 2.5, AST 21, ALT 21, alkaline phosphatase 50, total bilirubin 0.2.  CPK 39, negative MB, troponin 0.26.  Beta natriuretic peptide 139.  TSH 4.0.  HOSPITAL COURSE:  Patient was admitted with multiple pulmonary emboli and placed on heparin and early coumadinization.  She had an uneventful hospital course.  Did require a PICC line placement for lack of peripheral IV access for the heparin.  She tolerated everything well.  Was seen by physical therapy and occupational therapy for ambulation.  Patient had some difficulty with this and indicated that she lived alone and was quite apprehensive about going home to her home until she was considerably stronger.  She did receive some Ensure nutritional supplements.  Was seen by the care management team and decision was made for short-term nursing home placement for rehabilitation purposes.  Heparin was discontinued after a week and she was continued on Coumadin.  A bed was secured at [facility] for transfer for PT, OT, etcetera.  She will have protimes followed carefully and called to our office and she  will get in the Coumadin Clinic once she is discharged.  DISCHARGE MEDICATIONS:  1. Coumadin 2 mg alternating with 1 mg q.o.d. with protimes to be drawn on     Thursday, February 6 and Monday, February 10 called to our office for     regulation.  We will indicate follow-up frequency once we see these     values.  At discharge we need to be notified so we can get her into the     Coumadin Clinic     and be sure that she stays well anticoagulated.  2. Metoprolol 25 mg p.o. b.i.d.  3. Prilosec 20 mg p.o. b.i.d.  4.  Lipitor 10 mg p.o. q.d.  5. Lasix 40 mg p.o. q.d.  6. Norvasc 5 mg p.o. q.d.  7. Advair 50/250 one inhalation b.i.d.  8. Tranxene 7.5 mg p.o. t.i.d. p.r.n. for nerves.  9. Colace 100 mg p.o. q.d. 10. Tylenol 10 grains p.o. q.4h. p.r.n. 11. Laxative of choice. 12. Ambien 10 mg p.o. q.h.s. p.r.n. for sleep.  CONDITION ON DISCHARGE:  Improved.  DISPOSITION:  Patient will be transferred to [facility] for ongoing PT and OT.  On discharge from the nursing home she will enroll in cardiac rehabilitation and will see [place] back in the office for entry into the Coumadin Clinic. Dictated by:   Lonzo Cloud. Kriste Basque, M.D. [facility] Attending Physician:  Verdene Lennert DD:  12/09/01 TD:  12/09/01 Job: [id number]

## 2011-03-23 NOTE — Assessment & Plan Note (Signed)
[facility]                            CARDIOLOGY OFFICE NOTE   NAME:[name]                      MRN:          [id number]  DATE:10/08/2006                            DOB:          01-03-1929    [name] is stable from the cardiac viewpoint.  Unfortunately, she  slipped and fell and had some minor injuries that have healed.  She  needs to have cataract surgery, and it appears that for some reason her  insurance will not cover this.  From the cardiac viewpoint, however, she  is doing well.  She has a history of good LV function.  She has a  history of CABG in 2003.  She is not having any significant chest pain  or shortness of breath.   ALLERGIES:  PENICILLIN and AVALIDE.   MEDICATIONS:  Coumadin, Avapro, clorazepate, bumetanide, glimepiride,  Fosamax, metformin, omeprazole and Caduet.   OTHER MEDICAL PROBLEMS:  See the list in my note of April 15, 2006, which  is completely reviewed.   REVIEW OF SYSTEMS:  The patient has some rare discomfort in her neck  when turning her head.  I do not think that this is from her carotid.  However, she has mild carotid disease in the past.  Otherwise her review  of systems is negative.   PHYSICAL EXAMINATION:  The blood pressure is 130/80 with a pulse of 85.  Her weight is 180 pounds.  The patient is oriented to person, time and place.  Affect is normal.  There is no xanthelasma.  She has normal extraocular motion.  There are  no carotid bruits.  There is no jugular venous distention.  CARDIAC:  Exam reveals an S1 with an S2.  There are no clicks.  There is  a soft systolic murmur.  ABDOMEN:  Obese but soft.  She has no significant peripheral edema.   EKG reveals a sinus rhythm.   PROBLEM LIST:  1. Coronary disease, post coronary artery bypass graft in October of      2003.  The catheterization done since then showed that her vein      graft to her left anterior descending was widely patent.  She  had      some stenoses of the septal.  It is of note that her mammary could      not used at the time of her surgery because it was small caliber.  2. History of subclavian stenosis that was treated with percutaneous      intervention by Dr. Chales Abrahams in the past.  3. Some discomfort in her neck.  The patient does have carotid disease      documented in the past that was mild.  She has known vascular      disease elsewhere, and it is therefore      appropriate to proceed with a repeat Doppler of her neck.  This has      not been done for several years.  This will be arranged.  I will      see her back in 6 months for followup.  Luis Abed, MD, [facility]  Electronically Signed    JDK/MedQ  DD: 10/08/2006  DT: 10/09/2006  Job #: [id number]

## 2011-03-23 NOTE — Discharge Summary (Signed)
[name], ROZZELL                         ACCOUNT NO.:  [id number]   MEDICAL RECORD NO.:  [id number]                   PATIENT TYPE:  INP   LOCATION:  4713                                 FACILITY:  [facility]   PHYSICIAN:  Lonzo Cloud. Kriste Basque, M.D. [facility]            DATE OF BIRTH:  1929/04/16   DATE OF ADMISSION:  02/08/2003  DATE OF DISCHARGE:  02/10/2003                                 DISCHARGE SUMMARY   FINAL DIAGNOSES:  1. Admitted 02/08/2003 from the office with refractory asthmatic bronchitis,     weakness, dizziness and failure to thrive at home.  2. History of multiple small pulmonary emboli diagnosed in 2003 - the     patient on Coumadin therapy with improvement.  3. Coronary artery disease with one vessel coronary artery bypass grafting     to the left anterior descending in 2002.  A 2-D echo this admission with     good left ventricular function.  4. History of hyperlipidemia and morbid obesity.  5. Arteriosclerotic peripheral vascular disease with previous left     subclavian stent.  6. History of hypertension.  7. History of hiatus hernia and gastroesophageal reflux disease.  8. History of degenerative arthritis.  9. History of anxiety.  10.      Status post hysterectomy.  11.      History of allergy to IODINE, PENICILLIN, TOPROL, LASIX, and     POTASSIUM according to the patient.   BRIEF HISTORY AND PHYSICAL:  The patient is a 77 year old white female who  was seen several times in the office recently for asthmatic bronchitis.  She  was started on antibiotics, mucolytics, and her bronchodilators were  adjusted.  She returned complaining of extreme weakness and fatigue,  dizziness, and that she could just not make it at home.  This was confirmed  by the family who requested admission to the hospital for further evaluation  and treatment.  She complained of mild frontal headaches, blurry vision,  shortness of breath, cough with some bloody sputum production, vague chest  discomfort and had a generally positive review of systems with some nausea  but no vomiting, abdominal discomfort and worsening reflux despite Protonix  twice a day, generalized aching and soreness.  At the family's request, she  was admitted for further evaluation and treatment.   PHYSICAL EXAMINATION:  At time of admission blood pressure 164/100, pulse 88  and regular, respirations 20 per minute not labored, temperature 98 degrees,  O2 saturation 96% on room air.  HEENT exam showed some cerumen on the right,  tympanic membrane clear on the left; no lesions identified.  Neck showed no  jugular venous distention, no carotid bruits, no thyromegaly or  lymphadenopathy.  Chest exam was essentially clear except for some scattered  rhonchi; there was no wheezing.  Cardiac exam revealed a regular rhythm,  normal S1 and S2, without murmurs, rubs, or gallops detected; there was  a  median sternotomy scar.  Abdominal exam was soft, obese, and nontender; no  evidence of organomegaly or masses; normal bowel sounds.  Rectal was  deferred.  Extremities showed no cyanosis, clubbing, or edema; she had some  mild venous insufficiency changes and degenerative arthritis.  Neurologic  exam was intact without focal abnormalities detected.   LABORATORY DATA:  EKG showed normal sinus rhythm and nonspecific ST-T wave  changes; no acute abnormalities.  A 2-D echo was performed and showed normal  left ventricular size, overall normal left ventricular systolic function,  and mild concentric LVH.  The aortic valve was mildly calcified.  There was  mild mitral regurgitation and some right ventricular hypertrophy as well.  Chest x-ray showed mild cardiomegaly, the lungs were clear, there were  postoperative changes from her coronary bypass and stent in the subclavian  and coronary artery.  Abdominal films showed moderate amount of stool in the  proximal colon and a nonobstructive bowel-gas pattern.  Hemoglobin 13.2,   hematocrit 39.4, white count 10,300 with a normal differential.  Prothrombin  time 18.4, INR 1.6.  Sodium 142, potassium 4.2, chloride 107, CO2 29, BUN  11, creatinine 0.8, blood sugar 168, calcium 8.7, total protein 6.4, albumin  3.3, AST 21, ALT 23, alk phos 51, total bilirubin 0.5.  CPK 54 with a  negative MB and negative troponin.  BNP less than 30; normal.  TSH 1.78.   HOSPITAL COURSE:  The patient was admitted with refractory asthmatic  bronchitis and essentially failure to thrive at home.  It was unclear what  was causing these symptoms and once the initial laboratory data came back,  all of which was normal, and no acute abnormalities could be identified, the  patient was questioned about her home symptoms.  She did not have a clue as  to why she had so much difficulty at home as she improved very rapidly here  in the hospital.  She was given some IV fluids and Avelox IV to follow up on  her asthmatic bronchitis therapy that she was started on as an outpatient.  She feels this helped tremendously with her improvement.  She was given hand-  held nebulizer treatments, continued on her Advair and her other home  medications, Coumadin was adjusted by pharmacy protocol.  There was no sign  of heart damage as BNP was normal.  We did a 2-D echo for completeness and  results are above showing no acute changes.  The patient was seen by  discharge planners and physical therapy.  Physical therapy felt that she was  too high a level of function to qualify for any extended care facility rehab  program, so arrangements were made for home health PT and maximum home  support and the patient was encouraged to pursue assisted living-type  arrangement.  It is felt that this will give her the emotional support she  needs as we do not feel that she should be alone.   MEDICATIONS AT DISCHARGE:  Avelox 400 mg p.o. once daily for 5 more days until gone.  Advair 500/50 one puff b.i.d.  Home nebulizer  therapy three to  four times a day.  Coumadin - the patient will continue her same dose and  follow up in Coumadin clinic.  Diovan 80 mg p.o. once daily.  Norvasc 10 mg  p.o. once daily.  Lipitor 10 mg p.o. once daily.  Protonix 40 mg p.o. b.i.d.  Clorazepate 7.5 mg p.o. three times daily.  Ambien 10  mg p.o. at bedtime.  Tylenol as needed for pain.  Laxative of choice.   CONDITION AT DISCHARGE:  Improved.   DISPOSITION:  The patient will follow up with me in the office in 1 week on  02/17/2003 at 12 p.m.                                               Lonzo Cloud. Kriste Basque, M.D. [facility]    SMN/MEDQ  D:  02/10/2003  T:  02/11/2003  Job:  [id number]

## 2011-03-23 NOTE — Discharge Summary (Signed)
[name], [name]                         ACCOUNT NO.:  [id number]   MEDICAL RECORD NO.:  [id number]                   PATIENT TYPE:  INP   LOCATION:  5033                                 FACILITY:  [facility]   PHYSICIAN:  Gabrielle Dare. Janee Morn, M.D.             DATE OF BIRTH:  July 18, 1929   DATE OF ADMISSION:  06/27/2003  DATE OF DISCHARGE:  07/06/2003                                 DISCHARGE SUMMARY   PRIMARY CARE PHYSICIAN:  Lonzo Cloud. Kriste Basque, M.D. [facility].   CARDIOLOGIST:  Willa Rough, M.D.   CONSULTANTS:  1. Artist Pais Mina Marble, M.D.  2. Dyke Brackett, M.D., orthopedics.  3. Lonzo Cloud. Kriste Basque, M.D. [facility] who followed her medically in the hospital as     well.   DISCHARGE DIAGNOSES:  1. Status post fall from level ground.  2. Left intra-articular distal radius fracture.  3. Right olecranon fracture, right radial head fracture.  4. Facial abrasions.  5. Acute blood loss anemia, mild.  6. History of coronary artery disease with history of myocardial infarction     in the past.  7. Status post coronary artery bypass graft.  8. Hypertension.  9. New diagnosis diabetes mellitus.  10.      Osteoporosis.  11.      Anxiety.  12.      History of multiple pulmonary emboli on chronic Coumadin therapy.  13.      History of hiatal hernia with stricture, requiring dilatation in     the past.  14.      Osteoarthritis.  15.      History of diverticulitis.   PROCEDURES:  1. Status post open reduction and internal fixation of left distal radius     fracture, DDR plate and screws.  2. Open reduction and internal fixation of right olecranon fracture and     right radial head excision, right elbow, by Dr. Dairl Ponder on     06/30/2003.   HISTORY OF PRESENT ILLNESS:  This is a 77 year old female who tripped and  fell in her walkway in front of her house and fell onto both of her  outstretched arms and face.  She had no loss of consciousness.  She  presented complaining of right elbow and left  wrist pain.  She has a history  of multiple medical problems.  She was seen in consultation by Dr. Madelon Lips  and underwent closed reduction of her left distal radius and right elbow  fractures with splinting.  She was seen by Dr. Mina Marble following this, and  followup radiographs revealed an intra-articular left distal radius fracture  and a right fracture/dislocation of the elbow, including olecranon fracture  and fracture of the radial head/dislocation on the right.  Secondary to the  patient's injury, it was recommended that she undergo open reduction and  internal fixation bilaterally.  The patient was taken to the OR on 06/30/2003  after her INR  had been corrected.  She underwent open reduction and internal  fixation of her left distal radius fracture with plate and screws and open  reduction and internal fixation of her right olecranon fracture and right  radial head excision, right elbow, without intraoperative complications.  She did well postoperatively and was initially casted and later switched to  fiberglass splinting.  She did develop a moderate postoperative anemia, with  a hemoglobin of 9.9 and hematocrit of 29.8.  She was started on ferrous  sulfate 325 mg p.o. b.i.d. for this.   During the perioperative period it was also seen that the patient's blood  sugars were significantly elevated.  Again, the patient had a history of  multiple PE on chronic Coumadin and was seen in consultation per Dr. Kriste Basque  for medical management.  The patient was able to be restarted on her  Coumadin postoperatively.  Her blood sugars are continuing to be monitored  on a carbohydrate modified diet, and she was started on Glucophage 500 mg  p.o. b.i.d.  She will need followup at the outpatient diabetes clinic when  she is discharged from [facility] where she is being  transferred.  She does live alone.  As she has bilateral upper extremity  fractures, she will not be able to manage at  home alone at this point.  She  will need assistance with meals and all self-care due to her bilateral upper  extremity fractures.  She is able to ambulate but does need assistance to  stand and fatigues quickly.   Her incisions are healing well at the time of her discharge to [facility].   DISCHARGE MEDICATIONS:  1. Prilosec 30 mg p.o. daily.  Do not substitute.  2. Avapro 150 mg p.o. daily.  3. Norvasc 5 mg p.o. daily.  4. Zocor 20 mg p.o. daily.  5. Senokot-S, two tablets p.o. q.h.s.  6. Guiafenesin 1200 mg p.o. b.i.d.  7. Coumadin currently at 5 mg daily.  8. Glucophage 500 mg p.o. b.i.d.  9. Ensure supplement, one p.o. b.i.d.  10.      Moderate insulin sensitive Novolin R for coverage for capillary     blood glucoses q.i.d., q.a.c., and q.h.s.  11.      Tylox one to two p.o. q.4-6h. p.r.n. pain.  12.      Tylenol 650 mg p.o. q.4h. p.r.n. pain.  13.      MOM 30 cc p.o. daily p.r.n.  14.      Dulcolax suppository PR daily p.r.n.   DISCHARGE LABORATORY DATA:  Her last INR today on 07/06/2003 showed a protime  of 20.5, INR 2.3.  Last CBC showed a white blood cell count of 8200,  hemoglobin 9.9, hematocrit 29.8, platelets 185,000.  Chemistries showed a  sodium 138, potassium 4.1, chloride 105, CO2 28, glucose 183, BUN 7,  creatinine 0.7, calcium 7.7, total protein 5.2, albumin 2.2.  Liver  functions were normal, except for a minimally elevated total bilirubin at  1.3.  Glycosylated hemoglobin was 7.2.  TSH was 1.475.  Urinalysis was  negative.  Urine culture was no growth.   FOLLOW UP:  1. The patient will follow up with Dr. Mina Marble after discharge.  Dr.     Ronie Spies office will need to be contacted concerning the timeframe of     this follow up.  2. The patient will follow up with Dr. Kriste Basque following discharge from [facility] for  chronic medical issues.  3.  Follow up with trauma    service as needed.  3. Follow up with Dr. Madelon Lips as  needed.      SR/MEDQ  D:  07/06/2003  T:  07/06/2003  Job:  [id number]

## 2011-03-23 NOTE — H&P (Signed)
[name], [name]                         ACCOUNT NO.:  [id number]   MEDICAL RECORD NO.:  [id number]                   PATIENT TYPE:  INP   LOCATION:  5033                                 FACILITY:  [facility]   PHYSICIAN:  Gabrielle Dare. Janee Morn, M.D.             DATE OF BIRTH:  1929-03-25   DATE OF ADMISSION:  06/27/2003  DATE OF DISCHARGE:                                HISTORY & PHYSICAL   CHIEF COMPLAINT:  Bilateral upper extremity pain status post fall.   HISTORY OF PRESENT ILLNESS:  The patient is a 77 year old white female who  tripped on the walkway in front of her house and fell onto both of her arms  and face. She had no loss of consciousness. The patient complains of right  elbow and left wrist pain. She has no other complaints at this time.   PAST MEDICAL HISTORY:  1. Coronary artery disease. She has had MI x3.  2. Hypertension.  3. Cataracts.  4. Anxiety.  5. Osteoporosis.   FAMILY HISTORY:  Her father died of a CVA. Her mother died of uremia.   PAST SURGICAL HISTORY:  1. Cholecystectomy.  2. CABG.  3. Hysterectomy.  4. Multiple coronary stents  5. Hemorrhoidectomy.  6. Multiple pulmonary emboli.   HABITS:  She does not smoke or use alcohol.   HOME MEDICATIONS:  1. Coumadin 5 mg q.d. every Monday and 2.5 mg q.d. the other days.  2. Norvasc 5 mg q.d.  3. Diovan 80 mg q.d.  4. Advair 500/50 inhaled b.i.d.  5. Ambien 10 mg q.h.s.  6. Albuterol MDI.  7. Triamterene hydrochlorothiazide 37.5/25 one p.o. q.d.   PRIMARY MEDICAL DOCTOR:  Lonzo Cloud. Kriste Basque, M.D. [facility].   CARDIOLOGIST:  Willa Rough, M.D.   ALLERGIES:  PENICILLIN, TOPROL, and LASIX.   REVIEW OF SYSTEMS:  CONSTITUTIONAL:  Negative. EARS, NOSE, MOUTH, AND  THROAT:  Cataracts. CARDIOVASCULAR:  See the past medical history.  PULMONARY:  Negative. GASTROINTESTINAL:  Negative. GENITOURINARY:  Negative.  SKIN:  Bilateral upper extremity pain as described above.   PHYSICAL EXAMINATION:  VITAL SIGNS:  Pulse  89, blood pressure 167/79,  respirations 16, temperature 97.8, saturations 97%.  SKIN:  Warm.  HEENT:  She has some abrasions in her forehead and her nose without any  active bleeding. Eye exam:  Extraocular muscles are intact. Pupils are 2 mm  and reactive bilaterally. Ears:  Normal externally.  NECK:  Nontender with no swelling.  LUNGS:  Clear to auscultation bilaterally.  HEART:  Regular rate and rhythm.  ABDOMEN:  Soft and nontender with normal bowel sounds.  BACK:  Atraumatic and nontender with no step offs.  PELVIS:  Stable to palpation.  EXTREMITIES:  Right elbow has some significant edema/hematoma and  tenderness. Her left wrist also has a deformity.  NEUROLOGICAL:  GCS is 15. Orientation and memory are intact. Cranial nerves  II-XII are grossly intact. Motor and sensation  are normal in upper and lower  extremities, although both upper extremity exams are limited by the pain.  VASCULAR:  Intact. Pulses are 1+ and both of her hands are warm.   LABORATORY DATA:  CBC and BMET are pending. PT is 19.3, INR 2.0. X-rays:  Left wrist x-ray shows a Colles fracture and ________ stylet fracture. Right  elbow x-ray shows a proximal both-bone fracture. CT of the head was  negative.   IMPRESSION:  A 77 year old white female status post fall with left wrist  fracture and right proximal both-bone fracture and significant medical  history.   PLAN:  We will admit to the trauma service for some pain control. Will hold  her Coumadin for right now. Dr. Madelon Lips is going to evaluate her from an  orthopedic standpoint, and we will check a CBC and a BMET.                                                [name], M.D.    BET/MEDQ  D:  06/27/2003  T:  06/28/2003  Job:  [id number]

## 2011-03-23 NOTE — Discharge Summary (Signed)
[place]  Patient:    [name], [name]                      MRN: [id number] Adm. Date:  [id number] Disc. Date: 12/15/00 Attending:  Alric Quan Dictator:   Annett Fabian, P.A. CCLonzo Cloud. Kriste Basque, M.D. [facility], primary care             Luis Abed, M.D. [facility], cardiology                           Discharge Summary  DISCHARGE DIAGNOSES: 1. Coronary artery disease, status post successful percutaneous transluminal    coronary angioplasty with stent placement x 2 in the mid-LAD. 2. Gastroesophageal reflux disease. 3. Hypertension. 4. Hyperlipidemia. 5. Chronic obstructive pulmonary disease.  ADMISSION HISTORY:  The patient presented to the emergency room on February 3, with substernal chest pain consistent with prior anginal pain with some relief with sublingual nitroglycerin.  The patient was seen and admitted by Dr. Veneda Melter.  He noted the electrocardiogram to have some mild ST depression inferolaterally; however, he felt that it was difficult to differentiate symptoms based on the patients history.  HOSPITAL COURSE:  He admitted the patient with a diagnosis of rule out MI. She was placed on IV nitroglycerin and heparin.  The following day, the patient reports some brief episodes of chest pain during the day.  Cardiac enzymes were noted to be elevated and positive for MI.  She was also noticed to have urinary tract infection, and she was placed on ciprofloxacin.  She was taken to the cath lab that afternoon by Dr. Loraine Leriche Pulsipher.  Catheterization revealed akinesis of the anterior apical wall; however, there was overall preserved ejection fraction.  Ejection fraction estimated at 65%.  He also found a 100% subacute stent thrombosis in the mid-LAD.  After consultation with Dr. Juanda Chance, Dr. Gerri Spore performed a successful PTCA with stent placement x 2 in the mid-LAD.  The in-stent stenosis was reduced from 100% to less than 10%  with TIMI 3 flow.  The patient was placed on Integrilin to be continued for 48 hours.  For the next couple of days, the patient denied chest pain or shortness of breath; however, she reported feeling weak.  Cardiac rehabilitation was involved to increase the ambulation in the patient.  On February 7, the patient reported two episodes of chest pain.  Dr. Myrtis Ser placed the patient on Lovenox and Imdur.  He also felt that the patient may need relook catheterization.  On February 8, Dr. Myrtis Ser reexamined the patient.  She denied any further episodes of chest pain.  He felt that on the basis of this, that relook catheterization was not needed.  He advised increased ambulation to build patient confidence.  Over the next couple of days, the patient continued to be pain free.  Her ambulation increased, and she was still weak; however, cardiac rehabilitation personnel reported that she was ambulating better.  On February 10, the patient was examined by Dr. Olga Millers.  He felt the patient was stable for discharge.  DISCHARGE MEDICATIONS:  1. Plavix 75 mg q.d.  2. Enteric-coated aspirin 325 mg q.d.  3. Prilosec 20 mg b.i.d.  4. Norvasc 5 mg q.d.  5. Evista 60 mg q.d.  6. Lipitor 10 mg q.p.m.  7. Flovent, Serevent, Flonase as taken prior to admission.  8. Toprol XL 25 mg q.d.  9. Imdur 30 mg q.d. 10. Tranxene 7.5 mg as taken prior to admission.  DISCHARGE INSTRUCTIONS:  1. The patient was advised to increase activity as tolerated.  2. She was advised to follow a low-fat, low-cholesterol diet.  DISCHARGE FOLLOWUP:  1. She is to follow up with Dr. Kriste Basque as needed or scheduled.  2. She is to keep her prescheduled appointment with Dr. Myrtis Ser for February 18.  LABORATORY AND X-RAY DATA:  Sodium 141, potassium 3.9, chloride 109, CO2 26, BUN 14, creatinine 0.9, glucose 153.  White count 7.2, hemoglobin 10.2, hematocrit 30.8, platelets 231.  Cardiac enzymes in the evening of December 08, 2000:  CK 382, CK-MB 50.6, troponin I 8.52.  TSH 1.372. DD:  12/15/00 TD:  12/16/00 Job: [id number]

## 2011-03-23 NOTE — Consult Note (Signed)
[place]. [facility]  Patient:    [name], [name] Visit Number: [id number] MRN: [id number]          Service Type: MED Location: 2000 2031 01 Attending Physician:  Talitha Givens Dictated by:   Alleen Borne, M.D. Proc. Date: 08/29/01 Admit Date:  08/28/2001                            Consultation Report  REASON FOR CONSULTATION:  Severe, single-vessel, coronary artery disease with unstable angina.  CLINICAL HISTORY:  This patient is a 77 year old woman with a history of coronary artery disease who underwent stenting of the left anterior descending coronary artery on 12/05/00.  Two days later she had subacute in stent thrombosis with positive cardiac enzymes treated with PTCA; and stenting x2. She had no other significant coronary disease at that time.  Ejection fraction was 56% with moderate akinesis of the anterior wall and severe akinesis of the apical wall.  She has done well since then, but on 08/27/01 while riding her lawn mower she developed acute, recurrent chest pain similar to her prior episodes.  She described this as being sudden, sharp, moderately severe, and localized in the lower substernal region.  She did not have any radiation of the pain.  She took two nitroglycerin tablets with relief of her pain after about 5 minutes.  She was seen in the [place] office and scheduled for elective cardiac catheterization.   CPK isozymes and troponin levels were negative.  There were no acute EKG changes.  Cardiac catheterization today showed a 50% proximal LAD and then long, complete occlusion within the stent.  There were right-to-left collaterals filling the distal LAD.  The left circumflex had about 30% new vessel stenosis.  The right coronary artery had about 30% proximal stenosis. The left ventricular ejection fraction was about 60% with mild anterior apical hypokinesis.  Also noted, was about 80% proximal left subclavian artery stenosis  before the left internal mammary artery.  REVIEW OF SYSTEMS:  CONSTITUTIONAL:  She denies fever or chills. She has had no recent weight change.  EYES:  Negative.  ENT:  Negative.  ENDOCRINE:  She denies diabetes or hypothyroidism.  CARDIOVASCULAR:  As above, in the history of present illness.  She has had no PND or orthopnea.  She denies any shortness of breath.  She has noted a decreased energy level with cardiac rehab.  RESPIRATORY:  She denies cough or sputum production.  She does have a history of asthma and bronchitis.  GI:  She has occasional gastroesophageal reflux symptoms.  She has a history of peptic stricture and is status post dilatation in 1998.  GU:  She denies dysuria and hematuria.  HEMATOLOGIC:  She denies any history of bleeding disorders or easy bleeding.  PSYCHIATRIC:  She has a history of anxiety.  NEUROLOGIC:  She has no history of TIA or a stroke. She denies dizziness or syncope.  She has had no focal weakness or numbness.  ALLERGIES:  PENICILLIN and ASPIRIN.  MEDICATIONS:  At the time of admission were Prilosec 20 mg b.i.d., Norvasc 5 mg q.d., Flonase nasal spray 2 sprays q.d., clorazepate, Evista 60 mg q.d., Serevent 2 sprays twice a day, Flovent 2 sprays twice a day, Plavix 75 mg q.d., Lipitor 10 mg q.d., Toprol XL 25 mg q.d., Imdur 30 mg q.d., aspirin 81 mg q.d., and HCTZ 12.5 mg q.d.  PAST MEDICAL HISTORY:  Significant for  coronary disease as mentioned above. She has a history of asthmatic bronchitis.  She has a history of degenerative joint disease.  She has a history of hiatal hernia and gastroesophageal reflux.  She has a history of peptic esophageal stricture dilated in 1998. She is status post cholecystectomy, status post hysterectomy, status post hemorrhoidectomy, status post appendectomy.  She has a history of hypertension and hyperlipidemia.  SOCIAL HISTORY:  She is divorced with 1 child.  She has never smoked.  She drinks occasional wine.  FAMILY  HISTORY:  Her father died at age 73 of stroke and complications. Mother died at age 64 of bladder cancer.  She has a sister who is age 72 who has had a myocardial infarction in her 36s.  PHYSICAL EXAMINATION:  VITAL SIGNS:  Blood pressure 134/75, and her pulse is 85 and regular, respiratory rate is 16 and unlabored.  GENERAL:  She is an obese, white female in no distress.  HEENT:  Shows her to be normocephalic and atraumatic.  Pupils are equal and reacted to light and accommodation.  Extraocular muscles are intact.  Her throat is clear.  NECK:  Exam shows normal carotid pulses bilaterally without bruits.  There is no adenopathy or thyromegaly.  CARDIAC:  Shows a regular rate and rhythm with normal S1 and S2. There is no murmur, rub, or gallop.  LUNGS:  Clear.  ABDOMEN:  Exam shows active bowel sounds.  The abdomen is soft, obese, and nontender.  There are no palpable masses or organomegaly.  EXTREMITIES:  Shows no peripheral edema.  Pedal pulses are palpable bilaterally.  SKIN:  Warm and dry.  NEUROLOGIC:  Exam shows that she is alert and oriented x3.  Motor and sensory exam is grossly normal.  LABORATORY DATA:  Electrocardiogram shows normal sinus rhythm with a first degree AV block.  There is septal infarct.  Laboratory examinations are pending.  IMPRESSION: This patient has severe single-vessel, coronary disease with occlusion of her left anterior descending (LAD) within her previously stented, proximal portion.  The distal LAD is collaterlized from the right coronary artery.  She has unstable anginal symptoms, but are of new onset.  I agree that coronary bypass to the LAD using a left internal mammograft would be the best treatment for her.  She does have subclavian stenosis and most likely would need a free, left internal mammograft.  I discussed the operative procedure with her and her brother including alternatives, benefits, and risks; including  bleeding, possible blood transfusion, infection, stroke, myocardial infarction, and  death.  They understand and would like to proceed with surgery.  They would like to have Dr. Alvino Chapel due her surgery and we will plan on him doing it next Wednesday.  I will discuss it further with him on Monday. Dictated by:   Alleen Borne, M.D. Attending Physician:  Talitha Givens DD:  08/29/01 TD:  09/01/01 Job: [id number]

## 2011-03-23 NOTE — Cardiovascular Report (Signed)
[place]. [facility]  Patient:    [name], [name]                      MRN: [id number] Proc. Date: 12/05/00 Adm. Date:  [id number] Attending:  Lenoria Farrier CC:         Lonzo Cloud. Kriste Basque, M.D. [facility]  Luis Abed, M.D. [facility]  Cardiac Catheterization Lab   Cardiac Catheterization  CLINICAL HISTORY:  Ms. Rinkenberger is 77 years old and has risk factors for coronary heart disease with high blood pressure and hyperlipidemia but has had no prior known heart disease.  She had an episode of severe chest pain 2 weeks ago and was seen in our office, and her ECG suggested a possible old septal infarct, and she had a stress echogram which showed apical hypokinesis with superimposed ischemia in the apex and lateral wall and septum.  She was scheduled for evaluation with catheterization.  DESCRIPTION OF PROCEDURE:  The procedure was performed via the right femoral artery using an arterial sheath and 6-French preformed coronary catheters.  A front wall arterial puncture was performed.  Omnipaque contrast was used.  At the completion of the diagnostic study, we made a decision to proceed with intervention on the totally occluded LAD which we thought had probably occluded 2 weeks ago.  The patient was given weight-adjusted heparin to prolong an ACT greater than 200 seconds.  We withheld Integrilin until we crossed the lesion with the wire.  We used a 6-French JL3.5 guiding catheter and a PT Graphix wire.  We crossed the totally obstructed proximal LAD with the wire without too much difficulty.  Initially, we went in and performed a total of 5 inflations up and down in the proximal vessel with a 2.25- x 20-mm CrossSail performing inflations up to 5 atmospheres for 1 minute.  We then deployed a 2.25- x 18-mm Pixel stent in the proximal LAD at the point of total occlusion with 1 inflation of 8 atmospheres for 34 seconds.  We then post dilated with a 2.5- x 15-mm Quantum  Monorail, performing 2 inflations of 12 and 8 atmospheres for 33 and 36 seconds.  We then dilated a lesion in the mid vessel with a 2.25 Maverick, performing 2 inflations of 6 atmospheres for 1.5 and 2 minutes.  We then made a decision that we needed to place a second stent proximal to the first stent for what appeared to be residual disease or an edge dissection. We deployed a 2.5- x 8-mm Pixel, deploying this with 1 inflation of 15 atmospheres for 42 seconds.  We then post dilated with a 2.75- x 9-mm Quantum Monorail, performing 2 inflations of 13 and 15 atmospheres for 32 and 21 seconds.  Next, we decided that we needed to stent the mid lesion, and we used a 2.25- x 13-mm Pixel and deployed this with 1 inflation at 11 atmospheres for 50 seconds.  Repeat diagnostics were then performed through the guiding catheter.  The patient tolerated the procedure well and left the laboratory in satisfactory condition.  RESULTS: 1. The left main coronary artery:  The left main coronary artery was free of    significant disease. 2. The left anterior descending artery:  The left anterior descending artery    was completely occluded after the first septal perforator and diagonal    branch.  The distal LAD filled via collaterals from the right coronary    artery. 3. The circumflex artery:  The  circumflex artery gave rise to an intermedius    branch, a marginal branch, and a posterolateral branch.  These vessels were    free of significant disease. 4. The right coronary artery:  The right coronary artery is a dominant vessel    that gave rise to a conus branch, a right ventricular branch, a posterior    descending branch, and a posterolateral branch.  There was 40% narrowing in    the proximal vessel and irregularity in the proximal to mid vessel.  Left ventriculogram:  The left ventriculogram, performed in the RAO projection, showed hypokinesis of the apex.  Overall wall motion was good with an  estimated ejection fraction of 60%.  Distal aortogram:  A distal aortogram was performed which showed a patent left renal artery.  The catheter was slightly too low to evaluate the right renal artery.  There was no major aortoiliac obstruction.  Following placement of tandem overlying stents in the proximal left anterior descending artery, the stenosis improved from 100% to 0%.  There was no dissection seen.  Following stenting of the mid left anterior descending artery, the stenosis improved from 80% to 0%.  CONCLUSION: 1. Coronary artery disease status post small out-of-hospital anterior wall    myocardial infarction 2 weeks ago with total occlusion of the LAD, no major    obstruction in the circumflex artery, 40% narrowing in the proximal right    coronary artery, and apical wall hypokinesis. 2. Successful placement of tandem overlying stents in the proximal LAD with    improvement of percent area narrowing from 100% to 0%. 3. Successful stenting of the mid LAD with improvement of percent area    narrowing from 80% to 0%.  DISPOSITION:  The patient was transferred to the post anesthesia unit for further observation.  She has 3 stents in the proximal and mid LAD and will be at somewhat increased risk for a restenosis given the number of stents and the caliber of vessel; however, the result is good, and I think her chances for good long-term outlook are reasonably good. DD:  12/05/00 TD:  12/05/00 Job: [id number]

## 2011-04-20 ENCOUNTER — Other Ambulatory Visit: Payer: Self-pay | Admitting: Pulmonary Disease

## 2011-04-24 ENCOUNTER — Telehealth: Payer: Self-pay | Admitting: Pulmonary Disease

## 2011-04-24 NOTE — Telephone Encounter (Signed)
Form up front ready for pick up. LMOM for Tiburcio Bash to be made aware.

## 2011-04-24 NOTE — Telephone Encounter (Signed)
Spoke with pt's brother. He states that he dropped off form for handicapped placard renewal yesterday and wants to know the status of this. We states will pick this up when ready. He states that if we call him back and do not get answer, ok to [facility]. Please advise, thanks!

## 2011-05-07 ENCOUNTER — Telehealth: Payer: Self-pay | Admitting: Pulmonary Disease

## 2011-05-07 MED ORDER — CLORAZEPATE DIPOTASSIUM 7.5 MG PO TABS: 7.5000 mg | ORAL_TABLET | Freq: Three times a day (TID) | ORAL | Status: AC

## 2011-05-07 NOTE — Telephone Encounter (Signed)
Spoke with Tiburcio Bash and made aware rx has been sent to pharm. He verbalized understanding.

## 2011-05-07 NOTE — Telephone Encounter (Signed)
Called, spoke with pt who sounded very confused.  She did not recall needing a refill and was requesting I call her brother, Vernia Buff, at [phone] regarding this.    I called and spoke with Tiburcio Bash who states pt has been out of clorazepate x 3-4 days.  He is requesting rx for this - [facility].  However, pt was last seen on 01/16/10 and has no pending appts.  He scheduled OV for 06/05/11 at 4pm with SN and he will inform pt of this appt.  Requesting rx to last until appt.  SN, pls advise if this is ok,  Thanks!   Note:  Reginald's cell # [phone]

## 2011-05-07 NOTE — Telephone Encounter (Signed)
Per SN --ok to refill the clorazepate--this has been sent to the pts pharmacy already. thanks

## 2011-05-24 ENCOUNTER — Other Ambulatory Visit: Payer: Self-pay | Admitting: Pulmonary Disease

## 2011-06-05 ENCOUNTER — Encounter: Payer: Self-pay | Admitting: Pulmonary Disease

## 2011-06-05 ENCOUNTER — Ambulatory Visit (INDEPENDENT_AMBULATORY_CARE_PROVIDER_SITE_OTHER): Payer: Medicare Other | Admitting: Pulmonary Disease

## 2011-06-05 ENCOUNTER — Ambulatory Visit (INDEPENDENT_AMBULATORY_CARE_PROVIDER_SITE_OTHER)
Admission: RE | Admit: 2011-06-05 | Discharge: 2011-06-05 | Disposition: A | Discharge status: Home / Self Care | Payer: Medicare Other | Source: Ambulatory Visit | Attending: Pulmonary Disease | Admitting: Pulmonary Disease

## 2011-06-05 DIAGNOSIS — K219 Gastro-esophageal reflux disease without esophagitis: Secondary | ICD-10-CM

## 2011-06-05 DIAGNOSIS — I10 Essential (primary) hypertension: Secondary | ICD-10-CM

## 2011-06-05 DIAGNOSIS — I739 Peripheral vascular disease, unspecified: Secondary | ICD-10-CM

## 2011-06-05 DIAGNOSIS — K589 Irritable bowel syndrome without diarrhea: Secondary | ICD-10-CM

## 2011-06-05 DIAGNOSIS — R930 Abnormal findings on diagnostic imaging of skull and head, not elsewhere classified: Secondary | ICD-10-CM

## 2011-06-05 DIAGNOSIS — I679 Cerebrovascular disease, unspecified: Secondary | ICD-10-CM

## 2011-06-05 DIAGNOSIS — M810 Age-related osteoporosis without current pathological fracture: Secondary | ICD-10-CM

## 2011-06-05 DIAGNOSIS — E785 Hyperlipidemia, unspecified: Secondary | ICD-10-CM

## 2011-06-05 DIAGNOSIS — M1990 Unspecified osteoarthritis, unspecified site: Secondary | ICD-10-CM

## 2011-06-05 DIAGNOSIS — E119 Type 2 diabetes mellitus without complications: Secondary | ICD-10-CM

## 2011-06-05 DIAGNOSIS — F411 Generalized anxiety disorder: Secondary | ICD-10-CM

## 2011-06-05 DIAGNOSIS — I2510 Atherosclerotic heart disease of native coronary artery without angina pectoris: Secondary | ICD-10-CM

## 2011-06-05 DIAGNOSIS — J42 Unspecified chronic bronchitis: Secondary | ICD-10-CM

## 2011-06-05 DIAGNOSIS — F0390 Unspecified dementia without behavioral disturbance: Secondary | ICD-10-CM

## 2011-06-05 MED ORDER — METOPROLOL SUCCINATE ER 25 MG PO TB24: 25.0000 mg | ORAL_TABLET | Freq: Every day | ORAL | Status: DC
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
## 2011-06-05 NOTE — Patient Instructions (Signed)
Today we updated your med list in EPIC...    We added the METOPROLOL 25mg  one tab daily to your regimen...  Today we did your follow up CXR and we would like you to return to our lab one morning this week for your fasting blood work...    Please call the [facility]REE in a few days for your results...    Dial [id number] & when prompted enter your patient number followed by the # symbol...    Your patient number is:  [id number]#  Keep up the good work w/ your diet & exercise program...  Call for any questions...  Let's plan a follow up visit in 6 months.Marland KitchenMarland Kitchen

## 2011-06-06 ENCOUNTER — Other Ambulatory Visit (INDEPENDENT_AMBULATORY_CARE_PROVIDER_SITE_OTHER): Payer: Medicare Other

## 2011-06-06 ENCOUNTER — Other Ambulatory Visit: Payer: Self-pay | Admitting: Pulmonary Disease

## 2011-06-06 DIAGNOSIS — K589 Irritable bowel syndrome without diarrhea: Secondary | ICD-10-CM

## 2011-06-06 DIAGNOSIS — I10 Essential (primary) hypertension: Secondary | ICD-10-CM

## 2011-06-06 DIAGNOSIS — E119 Type 2 diabetes mellitus without complications: Secondary | ICD-10-CM

## 2011-06-06 DIAGNOSIS — F411 Generalized anxiety disorder: Secondary | ICD-10-CM

## 2011-06-06 DIAGNOSIS — E785 Hyperlipidemia, unspecified: Secondary | ICD-10-CM

## 2011-06-06 LAB — BASIC METABOLIC PANEL
BUN: 11 mg/dL (ref 6–23)
Calcium: 9 mg/dL (ref 8.4–10.5)
Chloride: 104 mEq/L (ref 96–112)
Creatinine, Ser: 0.8 mg/dL (ref 0.4–1.2)
GFR: 78.96 mL/min (ref >60.00)

## 2011-06-06 LAB — CBC WITH DIFFERENTIAL/PLATELET
Basophils Relative: 0.8 % (ref 0.0–3.0)
Eosinophils Absolute: 0.1 10*3/uL (ref 0.0–0.7)
Eosinophils Relative: 1.9 % (ref 0.0–5.0)
Hemoglobin: 13.4 g/dL (ref 12.0–15.0)
MCHC: 33.3 g/dL (ref 30.0–36.0)
MCV: 95.5 fl (ref 78.0–100.0)
Monocytes Absolute: 0.6 10*3/uL (ref 0.1–1.0)
Neutro Abs: 4.2 10*3/uL (ref 1.4–7.7)
Neutrophils Relative %: 54.2 % (ref 43.0–77.0)
RBC: 4.21 Mil/uL (ref 3.87–5.11)
WBC: 7.7 10*3/uL (ref 4.5–10.5)

## 2011-06-06 LAB — HEMOGLOBIN A1C: Hgb A1c MFr Bld: 7.1 % — ABNORMAL HIGH (ref 4.6–6.5)

## 2011-06-06 LAB — LIPID PANEL
Cholesterol: 178 mg/dL (ref 0–200)
HDL: 47.2 mg/dL (ref >39.00)
Total CHOL/HDL Ratio: 4
Triglycerides: 210 mg/dL — ABNORMAL HIGH (ref 0.0–149.0)
VLDL: 42 mg/dL — ABNORMAL HIGH (ref 0.0–40.0)

## 2011-06-07 LAB — LDL CHOLESTEROL, DIRECT: Direct LDL: 101.4 mg/dL

## 2011-06-07 LAB — HEPATIC FUNCTION PANEL
ALT: 33 U/L (ref 0–35)
Bilirubin, Direct: 0.3 mg/dL (ref 0.0–0.3)
Total Bilirubin: 0.8 mg/dL (ref 0.3–1.2)

## 2011-06-10 ENCOUNTER — Encounter: Payer: Self-pay | Admitting: Pulmonary Disease

## 2011-06-18 ENCOUNTER — Encounter: Payer: Self-pay | Admitting: Cardiology

## 2011-06-18 ENCOUNTER — Telehealth: Payer: Self-pay | Admitting: Pulmonary Disease

## 2011-06-18 MED ORDER — BUMETANIDE 0.5 MG PO TABS: 0.5000 mg | ORAL_TABLET | Freq: Every day | ORAL | Status: DC

## 2011-06-18 MED ORDER — IBANDRONATE SODIUM 150 MG PO TABS: 150.0000 mg | ORAL_TABLET | ORAL | Status: DC

## 2011-06-18 MED ORDER — LOSARTAN POTASSIUM 50 MG PO TABS: 50.0000 mg | ORAL_TABLET | Freq: Every day | ORAL | Status: DC

## 2011-06-18 MED ORDER — AMLODIPINE-ATORVASTATIN 5-20 MG PO TABS: 1.0000 | ORAL_TABLET | Freq: Every day | ORAL | Status: DC

## 2011-06-18 MED ORDER — OMEPRAZOLE 20 MG PO CPDR: 20.0000 mg | DELAYED_RELEASE_CAPSULE | Freq: Every day | ORAL | Status: DC

## 2011-06-18 MED ORDER — CLOPIDOGREL BISULFATE 75 MG PO TABS: 75.0000 mg | ORAL_TABLET | Freq: Every day | ORAL | Status: DC

## 2011-06-18 MED ORDER — TRAMADOL HCL 50 MG PO TABS: 50.0000 mg | ORAL_TABLET | Freq: Four times a day (QID) | ORAL | Status: DC | PRN

## 2011-06-18 MED ORDER — GLIMEPIRIDE 1 MG PO TABS: ORAL_TABLET | ORAL | Status: DC

## 2011-06-18 MED ORDER — METFORMIN HCL 500 MG PO TABS: 500.0000 mg | ORAL_TABLET | Freq: Every day | ORAL | Status: DC

## 2011-06-18 MED ORDER — METOPROLOL SUCCINATE ER 25 MG PO TB24: 25.0000 mg | ORAL_TABLET | Freq: Every day | ORAL | Status: DC
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
## 2011-06-18 NOTE — Telephone Encounter (Signed)
All meds have been sent to the pharmacy per family request.  Please let the pt know. thanks

## 2011-06-18 NOTE — Telephone Encounter (Signed)
Pt aware meds sent. [name], CMA

## 2011-06-28 ENCOUNTER — Telehealth: Payer: Self-pay | Admitting: *Deleted
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
## 2011-07-12 ENCOUNTER — Ambulatory Visit: Payer: Self-pay | Admitting: Cardiology

## 2011-07-16 ENCOUNTER — Encounter: Payer: Self-pay | Admitting: Cardiology

## 2011-07-16 DIAGNOSIS — Z951 Presence of aortocoronary bypass graft: Secondary | ICD-10-CM | POA: Insufficient documentation

## 2011-07-16 DIAGNOSIS — K219 Gastro-esophageal reflux disease without esophagitis: Secondary | ICD-10-CM | POA: Insufficient documentation

## 2011-07-16 DIAGNOSIS — Z789 Other specified health status: Secondary | ICD-10-CM | POA: Insufficient documentation

## 2011-07-16 DIAGNOSIS — I771 Stricture of artery: Secondary | ICD-10-CM | POA: Insufficient documentation

## 2011-07-16 DIAGNOSIS — E119 Type 2 diabetes mellitus without complications: Secondary | ICD-10-CM | POA: Insufficient documentation

## 2011-07-16 DIAGNOSIS — I739 Peripheral vascular disease, unspecified: Secondary | ICD-10-CM

## 2011-07-16 DIAGNOSIS — I2510 Atherosclerotic heart disease of native coronary artery without angina pectoris: Secondary | ICD-10-CM | POA: Insufficient documentation

## 2011-07-19 ENCOUNTER — Inpatient Hospital Stay (HOSPITAL_COMMUNITY)
Admission: EM | Admit: 2011-07-19 | Discharge: 2011-07-21 | DRG: 689 | Disposition: A | Discharge status: Home / Self Care | Payer: Medicare Other | Attending: Internal Medicine | Admitting: Internal Medicine

## 2011-07-19 ENCOUNTER — Emergency Department (HOSPITAL_COMMUNITY): Payer: Medicare Other

## 2011-07-19 ENCOUNTER — Ambulatory Visit: Payer: Self-pay | Admitting: Cardiology

## 2011-07-19 ENCOUNTER — Telehealth: Payer: Self-pay | Admitting: Pulmonary Disease

## 2011-07-19 DIAGNOSIS — G92 Toxic encephalopathy: Secondary | ICD-10-CM | POA: Diagnosis present

## 2011-07-19 DIAGNOSIS — K449 Diaphragmatic hernia without obstruction or gangrene: Secondary | ICD-10-CM | POA: Diagnosis present

## 2011-07-19 DIAGNOSIS — E119 Type 2 diabetes mellitus without complications: Secondary | ICD-10-CM | POA: Diagnosis present

## 2011-07-19 DIAGNOSIS — Z9861 Coronary angioplasty status: Secondary | ICD-10-CM

## 2011-07-19 DIAGNOSIS — G929 Unspecified toxic encephalopathy: Secondary | ICD-10-CM | POA: Diagnosis present

## 2011-07-19 DIAGNOSIS — M810 Age-related osteoporosis without current pathological fracture: Secondary | ICD-10-CM | POA: Diagnosis present

## 2011-07-19 DIAGNOSIS — N390 Urinary tract infection, site not specified: Principal | ICD-10-CM | POA: Diagnosis present

## 2011-07-19 DIAGNOSIS — I2510 Atherosclerotic heart disease of native coronary artery without angina pectoris: Secondary | ICD-10-CM | POA: Diagnosis present

## 2011-07-19 DIAGNOSIS — E785 Hyperlipidemia, unspecified: Secondary | ICD-10-CM | POA: Diagnosis present

## 2011-07-19 DIAGNOSIS — I10 Essential (primary) hypertension: Secondary | ICD-10-CM | POA: Diagnosis present

## 2011-07-19 DIAGNOSIS — Z86718 Personal history of other venous thrombosis and embolism: Secondary | ICD-10-CM

## 2011-07-19 DIAGNOSIS — R0789 Other chest pain: Secondary | ICD-10-CM | POA: Diagnosis present

## 2011-07-19 DIAGNOSIS — K219 Gastro-esophageal reflux disease without esophagitis: Secondary | ICD-10-CM | POA: Diagnosis present

## 2011-07-19 DIAGNOSIS — Z951 Presence of aortocoronary bypass graft: Secondary | ICD-10-CM

## 2011-07-19 DIAGNOSIS — F0390 Unspecified dementia without behavioral disturbance: Secondary | ICD-10-CM | POA: Diagnosis present

## 2011-07-19 LAB — URINALYSIS, ROUTINE W REFLEX MICROSCOPIC
Glucose, UA: NEGATIVE mg/dL
Protein, ur: NEGATIVE mg/dL
Specific Gravity, Urine: 1.027 (ref 1.005–1.030)
Urobilinogen, UA: 0.2 mg/dL (ref 0.0–1.0)

## 2011-07-19 LAB — URINE MICROSCOPIC-ADD ON

## 2011-07-19 NOTE — Telephone Encounter (Signed)
Corinne waiting in lobby to talk to a nurse [place] Cloud

## 2011-07-19 NOTE — Telephone Encounter (Signed)
Spoke with SN regarding this. She states that she will need to take pt to [facility] ED for eval of these symptoms, and then if they need to will call behavioral health for consultation. I have spoken with her sister Corrine, here in the office and let her know this. She agrees to this plan and will take the pt. I advised that she call us if there is anything that we can do.

## 2011-07-20 ENCOUNTER — Other Ambulatory Visit (HOSPITAL_COMMUNITY): Payer: Medicare Other

## 2011-07-20 ENCOUNTER — Inpatient Hospital Stay (HOSPITAL_COMMUNITY): Payer: Medicare Other

## 2011-07-20 ENCOUNTER — Emergency Department (HOSPITAL_COMMUNITY): Payer: Medicare Other

## 2011-07-20 DIAGNOSIS — I359 Nonrheumatic aortic valve disorder, unspecified: Secondary | ICD-10-CM

## 2011-07-20 LAB — RPR: RPR Ser Ql: NONREACTIVE

## 2011-07-20 LAB — CBC
HCT: 36.9 % (ref 36.0–46.0)
HCT: 40 % (ref 36.0–46.0)
Hemoglobin: 13.5 g/dL (ref 12.0–15.0)
MCH: 31.7 pg (ref 26.0–34.0)
MCH: 31.7 pg (ref 26.0–34.0)
MCHC: 33.8 g/dL (ref 30.0–36.0)
MCV: 92 fL (ref 78.0–100.0)
MCV: 93.9 fL (ref 78.0–100.0)
Platelets: 131 10*3/uL — ABNORMAL LOW (ref 150–400)
RBC: 4.01 MIL/uL (ref 3.87–5.11)
RBC: 4.26 MIL/uL (ref 3.87–5.11)
WBC: 7.8 10*3/uL (ref 4.0–10.5)

## 2011-07-20 LAB — GLUCOSE, CAPILLARY
Glucose-Capillary: 132 mg/dL — ABNORMAL HIGH (ref 70–99)
Glucose-Capillary: 141 mg/dL — ABNORMAL HIGH (ref 70–99)

## 2011-07-20 LAB — COMPREHENSIVE METABOLIC PANEL
ALT: 18 U/L (ref 0–35)
ALT: 18 U/L (ref 0–35)
AST: 17 U/L (ref 0–37)
Albumin: 3.3 g/dL — ABNORMAL LOW (ref 3.5–5.2)
Albumin: 3.1 g/dL — ABNORMAL LOW (ref 3.5–5.2)
Alkaline Phosphatase: 44 U/L (ref 39–117)
Alkaline Phosphatase: 42 U/L (ref 39–117)
CO2: 30 mEq/L (ref 19–32)
Chloride: 105 mEq/L (ref 96–112)
Chloride: 105 mEq/L (ref 96–112)
Creatinine, Ser: 0.74 mg/dL (ref 0.50–1.10)
GFR calc non Af Amer: >60 mL/min (ref >60)
Glucose, Bld: 126 mg/dL — ABNORMAL HIGH (ref 70–99)
Potassium: 3.4 mEq/L — ABNORMAL LOW (ref 3.5–5.1)
Potassium: 4 mEq/L (ref 3.5–5.1)
Sodium: 141 mEq/L (ref 135–145)
Sodium: 142 mEq/L (ref 135–145)
Total Bilirubin: 0.2 mg/dL — ABNORMAL LOW (ref 0.3–1.2)
Total Bilirubin: 0.2 mg/dL — ABNORMAL LOW (ref 0.3–1.2)
Total Protein: 6 g/dL (ref 6.0–8.3)

## 2011-07-20 LAB — LIPID PANEL
Cholesterol: 138 mg/dL (ref 0–200)
Total CHOL/HDL Ratio: 3.9 RATIO
VLDL: 59 mg/dL — ABNORMAL HIGH (ref 0–40)

## 2011-07-20 LAB — CARDIAC PANEL(CRET KIN+CKTOT+MB+TROPI)
CK, MB: 2.8 ng/mL (ref 0.3–4.0)
Relative Index: INVALID (ref 0.0–2.5)
Relative Index: INVALID (ref 0.0–2.5)
Total CK: 62 U/L (ref 7–177)
Total CK: 56 U/L (ref 7–177)

## 2011-07-20 LAB — DIFFERENTIAL
Eosinophils Absolute: 0.2 10*3/uL (ref 0.0–0.7)
Lymphocytes Relative: 40 % (ref 12–46)
Lymphs Abs: 3.2 10*3/uL (ref 0.7–4.0)
Monocytes Relative: 8 % (ref 3–12)
Neutrophils Relative %: 49 % (ref 43–77)

## 2011-07-20 LAB — CK TOTAL AND CKMB (NOT AT ARMC): Relative Index: INVALID (ref 0.0–2.5)

## 2011-07-20 LAB — TROPONIN I: Troponin I: <0.3 ng/mL (ref <0.30)

## 2011-07-20 LAB — AMMONIA: Ammonia: <10 umol/L — ABNORMAL LOW (ref 11–60)

## 2011-07-20 MED ORDER — TECHNETIUM TO 99M ALBUMIN AGGREGATED
6.0000 | Freq: Once | INTRAVENOUS | Status: AC | PRN
  Administered 2011-07-20: 6 via INTRAVENOUS

## 2011-07-20 MED ORDER — XENON XE 133 GAS
10.0000 | GAS_FOR_INHALATION | Freq: Once | RESPIRATORY_TRACT | Status: AC | PRN
  Administered 2011-07-20: 10 via RESPIRATORY_TRACT

## 2011-07-21 ENCOUNTER — Inpatient Hospital Stay (HOSPITAL_COMMUNITY): Payer: Medicare Other

## 2011-07-21 LAB — GLUCOSE, CAPILLARY: Glucose-Capillary: 135 mg/dL — ABNORMAL HIGH (ref 70–99)

## 2011-07-21 LAB — URINE CULTURE: Culture  Setup Time: 201209140352

## 2011-07-21 LAB — BASIC METABOLIC PANEL
Calcium: 9.6 mg/dL (ref 8.4–10.5)
GFR calc non Af Amer: >60 mL/min (ref >60)
Potassium: 4.9 mEq/L (ref 3.5–5.1)
Sodium: 140 mEq/L (ref 135–145)

## 2011-07-21 LAB — CBC
Hemoglobin: 14 g/dL (ref 12.0–15.0)
MCH: 31.5 pg (ref 26.0–34.0)
MCHC: 34.1 g/dL (ref 30.0–36.0)
Platelets: 193 10*3/uL (ref 150–400)
RBC: 4.44 MIL/uL (ref 3.87–5.11)

## 2011-07-23 ENCOUNTER — Telehealth: Payer: Self-pay | Admitting: Pulmonary Disease

## 2011-07-23 LAB — FOLATE RBC: RBC Folate: 1187 ng/mL — ABNORMAL HIGH (ref >=366)

## 2011-07-23 NOTE — Telephone Encounter (Signed)
I spoke with [name] and she pt is coming in on 9/24 at 11 for hfu

## 2011-07-26 ENCOUNTER — Encounter: Payer: Self-pay | Admitting: Cardiology

## 2011-07-26 DIAGNOSIS — I10 Essential (primary) hypertension: Secondary | ICD-10-CM | POA: Insufficient documentation

## 2011-07-27 ENCOUNTER — Ambulatory Visit: Payer: Self-pay | Admitting: Cardiology

## 2011-07-27 ENCOUNTER — Ambulatory Visit (INDEPENDENT_AMBULATORY_CARE_PROVIDER_SITE_OTHER): Payer: Medicare Other | Admitting: Adult Health

## 2011-07-27 ENCOUNTER — Encounter: Payer: Self-pay | Admitting: Adult Health

## 2011-07-27 VITALS — BP 140/76 | HR 69 | Temp 97.9°F | Ht 62.0 in | Wt 165.4 lb

## 2011-07-27 DIAGNOSIS — J42 Unspecified chronic bronchitis: Secondary | ICD-10-CM

## 2011-07-27 DIAGNOSIS — K3189 Other diseases of stomach and duodenum: Secondary | ICD-10-CM

## 2011-07-27 DIAGNOSIS — K319 Disease of stomach and duodenum, unspecified: Secondary | ICD-10-CM

## 2011-07-27 MED ORDER — LEVALBUTEROL HCL 0.63 MG/3ML IN NEBU
0.6300 mg | INHALATION_SOLUTION | Freq: Once | RESPIRATORY_TRACT | Status: AC
  Administered 2011-07-27: 0.63 mg via RESPIRATORY_TRACT

## 2011-07-27 MED ORDER — LEVALBUTEROL HCL 0.63 MG/3ML IN NEBU
0.6300 mg | INHALATION_SOLUTION | Freq: Three times a day (TID) | RESPIRATORY_TRACT | Status: DC
  Administered 2011-07-27: 0.63 mg via RESPIRATORY_TRACT

## 2011-07-27 MED ORDER — CEFDINIR 300 MG PO CAPS: 300.0000 mg | ORAL_CAPSULE | Freq: Two times a day (BID) | ORAL | Status: AC
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
## 2011-07-27 NOTE — Patient Instructions (Addendum)
Omnicef 300mg  Twice daily  For 7 days -take with food.  Mucinex DM Twice daily  As needed  Cough/congestion  Fluids and rest.  Please contact office for sooner follow up if symptoms do not improve or worsen or seek emergency care  follow up Dr. Kriste Basque  As planned and As needed

## 2011-07-28 NOTE — Assessment & Plan Note (Signed)
Flare   Plan;  Omnicef 300mg  Twice daily  For 7 days -take with food.  Mucinex DM Twice daily  As needed  Cough/congestion  Fluids and rest.  Please contact office for sooner follow up if symptoms do not improve or worsen or seek emergency care  follow up Dr. Kriste Basque  As planned and As needed

## 2011-07-30 ENCOUNTER — Ambulatory Visit: Payer: Self-pay | Admitting: Cardiology

## 2011-07-30 ENCOUNTER — Encounter: Payer: Self-pay | Admitting: Pulmonary Disease

## 2011-07-30 ENCOUNTER — Ambulatory Visit (INDEPENDENT_AMBULATORY_CARE_PROVIDER_SITE_OTHER): Payer: Medicare Other | Admitting: Pulmonary Disease

## 2011-07-30 VITALS — BP 114/64 | HR 67 | Temp 98.5°F | Ht 62.0 in | Wt 166.8 lb

## 2011-07-30 DIAGNOSIS — F0390 Unspecified dementia without behavioral disturbance: Secondary | ICD-10-CM

## 2011-07-30 DIAGNOSIS — M810 Age-related osteoporosis without current pathological fracture: Secondary | ICD-10-CM

## 2011-07-30 DIAGNOSIS — E785 Hyperlipidemia, unspecified: Secondary | ICD-10-CM | POA: Insufficient documentation

## 2011-07-30 DIAGNOSIS — I739 Peripheral vascular disease, unspecified: Secondary | ICD-10-CM

## 2011-07-30 DIAGNOSIS — K589 Irritable bowel syndrome without diarrhea: Secondary | ICD-10-CM

## 2011-07-30 DIAGNOSIS — E119 Type 2 diabetes mellitus without complications: Secondary | ICD-10-CM

## 2011-07-30 DIAGNOSIS — I10 Essential (primary) hypertension: Secondary | ICD-10-CM

## 2011-07-30 DIAGNOSIS — I2510 Atherosclerotic heart disease of native coronary artery without angina pectoris: Secondary | ICD-10-CM

## 2011-07-30 DIAGNOSIS — K219 Gastro-esophageal reflux disease without esophagitis: Secondary | ICD-10-CM

## 2011-07-30 DIAGNOSIS — M1990 Unspecified osteoarthritis, unspecified site: Secondary | ICD-10-CM
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
## 2011-07-30 NOTE — Patient Instructions (Signed)
Today we updated your med list in EPIC...  We will arrange for a Psychiatric consultation and a Neurological follow up for you...  Please cooperate w/ your relatives, they have your best interests at heart...  Call for any questions or if we can be of service in any way.Marland KitchenMarland Kitchen

## 2011-07-31 ENCOUNTER — Other Ambulatory Visit: Payer: Self-pay | Admitting: Pulmonary Disease

## 2011-07-31 DIAGNOSIS — F0390 Unspecified dementia without behavioral disturbance: Secondary | ICD-10-CM

## 2011-08-02 ENCOUNTER — Ambulatory Visit: Payer: Self-pay | Admitting: Cardiology

## 2011-08-05 NOTE — H&P (Signed)
[name], [name]               ACCOUNT NO.:  [id number]  MEDICAL RECORD NO.:  [id number]  LOCATION:  [facility]                         FACILITY:  [facility]  PHYSICIAN:  Eduard Clos, MDDATE OF BIRTH:  12/01/1928  DATE OF ADMISSION:  07/19/2011 DATE OF DISCHARGE:                             HISTORY & PHYSICAL   PRIMARY CARE PHYSICIAN:  Lonzo Cloud. Kriste Basque, MD  CHIEF COMPLAINT:  Increasing confusion, chest pain.  HISTORY OF PRESENTING ILLNESS:  An 77 year old female with known history of CAD status post CABG status post stenting, history of diabetes mellitus type 2, hypertension, hyperlipidemia, has been experiencing some chest pressure over the last 1 week.  The patient's chest pressure is even present at rest, increases with respiration.  Denies any cough, phlegm, or any fever or chills.  In addition, the patient has been noticed to have some low back pain and increasing confusion.  The patient's brother states that the patient is being confused, sometimes takes her medicines twice and also having some difficulty walking.  Did not lose consciousness, did not have any focal deficit.  Did not have any headache, visual symptoms, or any nausea, vomiting, abdominal pain, dysuria, discharge, or diarrhea.  Did not have any difficulty speaking or swallowing.  In the ER, the patient had a UA which is at this time compatible with UTI.  In addition, the patient had cardiac enzymes and EKG which does not show anything acute.  Chest x-ray also does not show anything acute at this time.  The patient admitted for further workup.  PAST MEDICAL HISTORY: 1. History of CAD status post CABG status post stenting. 2. History of hypertension. 3. History of hyperlipidemia. 4. History of PE, is off Coumadin now. 5. History of left subclavian stenosis. 6. History of nephrolithiasis. 7. History of diabetes mellitus type 2. 8. History of asthmatic bronchitis. 9. History of  esophageal  stricture. 10.History of irritable bowel syndrome. 11.History of hiatal hernia. 12.History of obesity. 13.History of DJD.  MEDICATIONS PRIOR TO ADMISSION: 1. Ibandronate 150 mg p.o. monthly. 2. Clorazepate 7.5 mg for anxiety as needed. 3. Tramadol 50 mg q.6 p.r.n. 4. Metformin 500 mg daily. 5. Omeprazole 20 mg daily. 6. Metoprolol XL 25 mg daily. 7. Glimepiride 1 mg half tablet daily. 8. Amlodipine and atorvastatin 5/20 p.o. daily. 9. Bumetanide 0.5 mg daily. 10.Clopidogrel 75 mg daily. 11.Losartan 50 mg daily.  ALLERGIES: 1. PENICILLIN. 2. KETOPROFEN. 3. IODINE. 4. LASIX. 5. METOPROLOL. 6. POTASSIUM CHLORIDE. 7. MOXIFLOXACIN.  SOCIAL HISTORY:  The patient lives alone, is frequented by her brother who lives across.  Denies smoking cigarettes, drinking alcohol, using illegal drugs.  FAMILY HISTORY:  Positive for stroke in her mother at age 70, but also had hypertension and bladder cancer.  Father died of stroke and hypertension in 55.  REVIEW OF SYSTEMS:  As present in history of presenting illness. Nothing else significant.  PHYSICAL EXAMINATION:  GENERAL:  The patient examined at bedside, not in acute distress. VITAL SIGNS:  Blood pressure is 140/68, pulse is 80 per minute, temperature 98.7, respirations 18 per minute, O2 sat 95%.  HEENT: Anicteric.  No pallor.  No facial asymmetry. Tongue is midline.  No discharge from  ears, eyes, nose, or mouth. NECK:  No neck rigidity. CHEST:  Bilateral air entry present.  No rhonchi.  No crepitation. HEART:  S1, S2 heard. ABDOMEN:  Soft, nontender.  Bowel sounds heard. CNS:  The patient is alert, awake, oriented to time, place, and person. Moves upper and lower extremities 5/5. EXTREMITIES:  Peripheral pulses felt.  No acute ischemic changes, cyanosis, or clubbing.  LABORATORY DATA:  EKG shows normal sinus rhythm with first degree AV block, low voltage with nonspecific ST-T changes.  Chest x-ray shows status post median  sternotomy and CABG.  No acute cardiopulmonary process.  CBC:  WBC is 7.8, hemoglobin is 12.7, hematocrit is 26.9, platelets 131.  Complete metabolic panel:  Sodium 141, potassium 3.4, chloride 105, carbon dioxide 30, glucose 140, BUN 15, creatinine 0.7, total bilirubin is 0.2, alkaline phosphatase 44, AST 17, ALT 18, total protein 6.3, albumin 3.3, calcium 9.5, CK is 53, MB is 2.  Troponin is less than 0.3.  UA showing nitrites negative, large leukocytes, wbc's 21- 50, bacteria few.  ASSESSMENT: 1. Chest pain with history of coronary artery disease status post     coronary artery bypass graft and stenting, rule out acute coronary     syndrome. 2. Delirium and confusional state. 3. Urinary tract infection. 4. History of pulmonary embolism, off Coumadin now. 5. History of diabetes type 2. 6. History of hypertension. 7. History of hyperlipidemia.  PLAN: 1. At this time, we will admit the patient to telemetry. 2. For her chest pain, we will cycle cardiac markers, place the     patient on aspirin.  Add 2-D echo and as the patient has had     previous PE we will get a CT angio chest to rule out PE. 3. Delirium and confusional state.  The patient's brother states that     she is getting increasingly confused with difficulty walking.  At     this time, I am going to get a CT head as well as get an MRI of the     brain, get ammonia level.  Check B12, folate levels, and RPR and     TSH levels.  Probably her confusion could be from the UTI but at     this time the patient could also be developing dementia. 4. For UTI, the patient has been placed on ceftriaxone.  We will get     urine culture and sensitivity. 5. Further recommendations based on the tests ordered and clinic     course.     Eduard Clos, MD     ANK/MEDQ  D:  07/20/2011  T:  07/20/2011  Job:  [id number]  cc:   Lonzo Cloud. Kriste Basque, MD  Electronically Signed by Midge Minium MD on 08/05/2011 11:57:28 AM

## 2011-08-07 ENCOUNTER — Telehealth: Payer: Self-pay | Admitting: Pulmonary Disease

## 2011-08-07 LAB — PROTIME-INR
Prothrombin Time: 20.6 seconds — ABNORMAL HIGH (ref 11.6–15.2)
Prothrombin Time: 46.6 — ABNORMAL HIGH

## 2011-08-07 LAB — CBC
HCT: 26 % — ABNORMAL LOW (ref 36.0–46.0)
HCT: 33.5 % — ABNORMAL LOW (ref 36.0–46.0)
Hemoglobin: 11.2 g/dL — ABNORMAL LOW (ref 12.0–15.0)
MCV: 92.9 fL (ref 78.0–100.0)
Platelets: 135 10*3/uL — ABNORMAL LOW (ref 150–400)
RBC: 3.63 MIL/uL — ABNORMAL LOW (ref 3.87–5.11)
RDW: 13.6 % (ref 11.5–15.5)
WBC: 8.5 10*3/uL (ref 4.0–10.5)

## 2011-08-07 LAB — COMPREHENSIVE METABOLIC PANEL
ALT: 26 U/L (ref 0–35)
Alkaline Phosphatase: 51 U/L (ref 39–117)
CO2: 24 mEq/L (ref 19–32)
Chloride: 106 mEq/L (ref 96–112)
GFR calc non Af Amer: >60 mL/min (ref >60)
Glucose, Bld: 122 mg/dL — ABNORMAL HIGH (ref 70–99)
Potassium: 4.3 mEq/L (ref 3.5–5.1)
Sodium: 139 mEq/L (ref 135–145)
Total Bilirubin: 1 mg/dL (ref 0.3–1.2)
Total Protein: 6.4 g/dL (ref 6.0–8.3)

## 2011-08-07 LAB — BASIC METABOLIC PANEL
BUN: 8 mg/dL (ref 6–23)
GFR calc non Af Amer: >60 mL/min (ref >60)
Glucose, Bld: 99 mg/dL (ref 70–99)
Potassium: 3.8 mEq/L (ref 3.5–5.1)

## 2011-08-07 LAB — D-DIMER, QUANTITATIVE: D-Dimer, Quant: 0.29 ug/mL-FEU (ref 0.00–0.48)

## 2011-08-07 LAB — BODY FLUID CULTURE
Culture: NO GROWTH
Gram Stain: NONE SEEN

## 2011-08-07 LAB — GLUCOSE, CAPILLARY: Glucose-Capillary: 89 mg/dL (ref 70–99)

## 2011-08-07 LAB — APTT: aPTT: 58 — ABNORMAL HIGH

## 2011-08-07 NOTE — Telephone Encounter (Signed)
I advised [name] that we have already sent over our records and to call them about her appointment. She states she will

## 2011-08-08 ENCOUNTER — Telehealth: Payer: Self-pay | Admitting: Pulmonary Disease

## 2011-08-08 NOTE — Telephone Encounter (Signed)
Spoke with Corine. She states that pt is sched with a neurologist and with psych coming up this month. Wanted to know if it mattered which one she saw first b/c she is sched to see psych first. I advised that this is fine, just try to encourage pt to keep both appts. She verbalized understanding and states nothing further needed.

## 2011-08-09 LAB — GLUCOSE, CAPILLARY
Glucose-Capillary: 88 mg/dL (ref 70–99)
Glucose-Capillary: 96 mg/dL (ref 70–99)
Glucose-Capillary: 96 mg/dL (ref 70–99)

## 2011-08-09 LAB — COMPREHENSIVE METABOLIC PANEL
ALT: 12 U/L (ref 0–35)
AST: 14 U/L (ref 0–37)
Albumin: 2.2 g/dL — ABNORMAL LOW (ref 3.5–5.2)
CO2: 27 mEq/L (ref 19–32)
Calcium: 8.2 mg/dL — ABNORMAL LOW (ref 8.4–10.5)
GFR calc Af Amer: >60 mL/min (ref >60)
GFR calc non Af Amer: >60 mL/min (ref >60)
Sodium: 145 mEq/L (ref 135–145)

## 2011-08-09 LAB — DIFFERENTIAL
Eosinophils Absolute: 0.3 10*3/uL (ref 0.0–0.7)
Eosinophils Relative: 5 % (ref 0–5)
Lymphs Abs: 1.4 10*3/uL (ref 0.7–4.0)
Monocytes Absolute: 0.5 10*3/uL (ref 0.1–1.0)
Monocytes Relative: 8 % (ref 3–12)

## 2011-08-09 LAB — HEMOGLOBIN A1C: Hgb A1c MFr Bld: 6.1 % (ref 4.6–6.1)

## 2011-08-09 LAB — CBC
Platelets: 162 10*3/uL (ref 150–400)
WBC: 5.6 10*3/uL (ref 4.0–10.5)

## 2011-08-16 NOTE — Discharge Summary (Signed)
NAMEAMANDO, [name]:  [id number]  MEDICAL RECORD NO.:  [id number]  LOCATION:  4734                         FACILITY:  [facility]  PHYSICIAN:  Rosanna Randy, MDDATE OF BIRTH:  02-Sep-1929  DATE OF ADMISSION:  07/19/2011 DATE OF DISCHARGE:  07/21/2011                              DISCHARGE SUMMARY   PRIMARY CARE PHYSICIAN:  Lonzo Cloud. Kriste Basque, MD  DISCHARGE DIAGNOSES:  Include: 1. Toxic-metabolic encephalopathy secondary to urinary tract infection     in a patient with underlying mild dementia 2. Urinary tract infection. 3. Noncardiac chest pain. 4. Mild dementia without behavioral problems and mini-mental status     exam of 23/30. 5. Diabetes mellitus type 2. 6. Hypertension. 7. Anxiety. 8. Dyslipidemia. 9. History of coronary artery disease 10.History of gastroesophageal reflux disease secondary to hiatal     hernia. 11.History of esophageal stricture. 12.Osteoporosis. 13.Degenerative joint disease.  DISCHARGE MEDICATIONS: 1. Levaquin 500 mg 1 tablet by mouth daily for three more days. 2. Lovaza 2 g by mouth daily. 3. Amlodipine/atorvastatin 5/20 mg 1 tablet by mouth daily. 4. Bumetanide 0.5 mg 1 tablet by mouth daily. 5. Clopidogrel 75 mg 1 tablet by mouth daily. 6. Clorazepate 7.5 mg half to one tablet by mouth three times a day as     needed for anxiety. 7. Amaryl 1 mg half tablet by mouth daily. 8. Ibandronate 150 mg 1 tablet by mouth monthly. 9. Losartan 50 mg 1 tablet by mouth daily. 10.Metformin 500 mg 1 tablet by mouth daily. 11.Metoprolol 25 mg extended release form 1 tablet by mouth daily. 12.Omeprazole 20 mg 1 tablet by mouth daily. 13.Tramadol 50 mg 1 tablet by mouth every 6 hours as needed for pain.  DISPOSITION AND FOLLOWUP:  The patient had been discharged in stable improved condition, currently pretty much with her neurologic exam back to baseline.  The patient is appropriate, just a little bit redundant on conversations and  a kind of go over and over on the same topics even when she had already told you that but able to answer questions appropriately, and pretty well oriented of the situation and the reason for her to come into the hospital.  At this point, the plan is for her to finish a 5 days total of antibiotics and to arrange a followup appointment with primary care physician, Dr. Kriste Basque, over the next 7-10 days.  During that appointment, it will be important to reevaluate the patient's mentation and decide if treatment to slow down the progression of dementia will be necessary.  The rest of her medical problems remained stable throughout this hospitalization and no changes were made except for the addition of fish oil 2 g daily in order to help with her dyslipidemia.  The patient was advised to follow a low-fat diet and also a heart-healthy low-sodium diet.  PROCEDURE PERFORMED:  During this hospitalization, the patient had a chest x-ray done on July 20, 2011, that demonstrated status post median sternotomy and CABG without any acute cardiopulmonary process.  A CT of the head without contrast demonstrated no acute intracranial pathology.  There was a mild cortical volume loss and scattered small vessel ischemic microangiopathic changes noted, otherwise  normal.  The patient had a V/Q scan that demonstrated very low probability for acute pulmonary embolism.  An MRI of the brain without contrast demonstrated no acute intracranial abnormality with signal changes suggestive of chronic hypertension.  There was a generalized cerebral volume loss. The patient had a 2-D echo done on July 20, 2011, that demonstrated a cavity size on the left ventricle that was normal, wall thickness was also normal, systolic function was normal with an ejection fraction of 60%-65%, and a grade 1 diastolic dysfunction was also appreciated.  No other procedures were performed during this hospitalization.   No consultations were made during this admission.  HISTORY OF PRESENT ILLNESS:  For full details, please refer to dictation done by Dr. Toniann Fail on July 20, 2011, but briefly, this is an 77- year-old female with a known history of coronary artery disease status post CABG and stent, also history of diabetes mellitus type 2, hypertension, hyperlipidemia, who has been experiencing some chest pressure over the last week prior to coming into the hospital.  The patient's chest pressure is even present at rest, increased with respiration.  She denies any cough, any phlegm, or any fever or chills. In addition, the patient has been noticing to have some low back pain and increasing confusion with some dysuria.  The patient was admitted to the hospital for further evaluation and treatment.  Pertinent laboratory data throughout this hospitalization include a urinalysis demonstrated a cloudy appearance with a trace of blood and large leukocytes.  Urine microscopy with a 21-50 white blood cells.  A CBC with differential; white blood cells 7.8, hemoglobin 12.7, platelets 145.  Cardiac markers negative x3.  A magnesium level was 2.0.  Lipidprofile demonstrated a triglycerides of 293 with HDL of 35, LDL of 44, total cholesterol 138, TSH 1.912, vitamin B12 of 755, ammonia level less than 10.  RPR was nonreactive.  A comprehensive metabolic panel showed a sodium of 142, potassium 4.0, chloride 105, bicarb 29, BUN 14, glucose 126, creatinine 0.76.  LFTs normal except for a mild decrease on her albumin of 3.1.  At discharge, BMET; 140 sodium, potassium 4.9, chloride 103, bicarb 27, glucose 133, BUN 19, creatinine 0.84, calcium 9.6.  A CBC with a white blood cells of 8.7, hemoglobin 14, platelets 193.  HOSPITAL COURSE BY PROBLEM: 1. Toxic-metabolic encephalopathy secondary to UTI in a patient with     underlying dementia.  The patient was admitted, received fluid     resuscitation and was started on  IV antibiotics.  Her symptoms     drastically improved, and at the moment of discharge, her     neurologic exam was back to baseline.  At this point, she will     finish her antibiotic therapy and will follow with primary care     physician as an outpatient in order to determine if she will     require another treatment for her dementia in order to try to slow     down the progression. 2. UTI.  Unfortunately, the urine culture did not demonstrate any     specific microorganism with the findings. We have decided to use     while she was in the hospital Rocephin for 2 days IV, and at     discharge, we are going to use three more days of levofloxacin once     a day to complete a total 5 days of antibiotics.  After discussing     with the patient, she reports having  a mild GI upset reaction in     the past when she was exposed to Avelox, so I think that is going     to be pretty safe to use Levaquin, and explaining the convenience     of once a day antibiotic, she agreed to try the medication. 3. Noncardiac chest pain with negative cardiac enzymes, no ischemic     changes on the EKG, no telemetry abnormalities, no wall motion     abnormalities on the 2-D echo with a negative V/Q scan, most likely     her chest pain is secondary to gastroesophageal reflux disease.  We     are going to continue using PPI.  The patient will follow with     primary care physician for further assessment of these symptoms in     case that recurs. 4. Mild dementia without behavioral problems.  Mini-mental status exam     was 23 out of 30.  The patient will follow with primary care     physician to determine treatment to a slow down progression or     suggest conservative monitorization. 5. Diabetes.  We are going to continue using glimepiride and also     metformin. 6. Hypertension, stable, well controlled.  We are going to continue     using losartan and metoprolol. 7. History of coronary artery disease, we are  going to continue using     beta blocker, statins, and Plavix. 8. History of anxiety, stable.  We are going to continue using p.r.n.     clorazepate. 9. History of gastroesophageal reflux disease.  We are going to     continue using her PPI. 10.Osteoporosis, she will continue using her monthly dose of     ibandronate. 11.Degenerative joint disease.  She will continue using p.r.n.     tramadol.  Discharge temperature was 97.9, heart rate 69, respiratory rate 20, blood pressure 122/76, oxygen saturation 95% on room air.  In general, the patient was in no acute distress.  Respiratory system was clear to auscultation.  Heart with a regular rate and rhythm.  No murmurs. Abdomen was soft, nontender with positive bowel sounds.  Extremities, no edema.  Neurologic exam was nonfocal.     Rosanna Randy, MD     CEM/MEDQ  D:  07/21/2011  T:  07/21/2011  Job:  [id number]  cc:   Lonzo Cloud. Kriste Basque, MD  Electronically Signed by Vassie Loll MD on 08/16/2011 08:00:17 AM

## 2011-08-21 LAB — COMPREHENSIVE METABOLIC PANEL
AST: 18
BUN: 10
CO2: 25
Calcium: 8.1 — ABNORMAL LOW
Chloride: 109
Creatinine, Ser: 0.66
GFR calc non Af Amer: >60
Glucose, Bld: 106 — ABNORMAL HIGH
Total Bilirubin: 0.5

## 2011-08-21 LAB — I-STAT 8, (EC8 V) (CONVERTED LAB)
BUN: 10
Bicarbonate: 23.1
Glucose, Bld: 114 — ABNORMAL HIGH
TCO2: 24
pH, Ven: 7.373 — ABNORMAL HIGH

## 2011-08-21 LAB — DIFFERENTIAL
Basophils Absolute: 0
Eosinophils Absolute: 0.2
Eosinophils Relative: 3
Lymphs Abs: 2.4
Neutrophils Relative %: 62

## 2011-08-21 LAB — CBC
HCT: 35.1 — ABNORMAL LOW
HCT: 36.3
Hemoglobin: 11.8 — ABNORMAL LOW
MCHC: 33.5
MCV: 87.8
MCV: 89
Platelets: 183
RBC: 4
RDW: 14.1 — ABNORMAL HIGH
WBC: 8.3
WBC: 8.5

## 2011-08-21 LAB — CARDIAC PANEL(CRET KIN+CKTOT+MB+TROPI)
CK, MB: 2.2
Relative Index: INVALID
Relative Index: INVALID
Troponin I: 0.02

## 2011-08-21 LAB — CK TOTAL AND CKMB (NOT AT ARMC)
Relative Index: INVALID
Total CK: 62

## 2011-08-21 LAB — LIPID PANEL
Triglycerides: 200 — ABNORMAL HIGH
VLDL: 40

## 2011-08-21 LAB — PROTIME-INR
INR: 2.9 — ABNORMAL HIGH
Prothrombin Time: 30.6 — ABNORMAL HIGH
Prothrombin Time: 32.3 — ABNORMAL HIGH

## 2011-08-21 LAB — POCT CARDIAC MARKERS
CKMB, poc: 1.1
Myoglobin, poc: 58.6

## 2011-08-21 LAB — APTT: aPTT: 35

## 2011-08-21 LAB — TROPONIN I: Troponin I: 0.03

## 2011-08-21 LAB — POCT I-STAT CREATININE: Creatinine, Ser: 0.9

## 2011-08-21 LAB — TSH: TSH: 1.861

## 2011-09-19 ENCOUNTER — Ambulatory Visit: Payer: Medicare Other | Admitting: Pulmonary Disease

## 2011-10-16 ENCOUNTER — Other Ambulatory Visit: Payer: Self-pay | Admitting: Cardiology

## 2011-10-27 ENCOUNTER — Emergency Department (HOSPITAL_COMMUNITY): Payer: Medicare Other

## 2011-10-27 ENCOUNTER — Other Ambulatory Visit: Payer: Self-pay

## 2011-10-27 ENCOUNTER — Inpatient Hospital Stay (HOSPITAL_COMMUNITY)
Admission: AD | Admit: 2011-10-27 | Discharge: 2011-11-02 | DRG: 690 | Disposition: A | Discharge status: Nursing Home (Includes ICF) | Payer: Medicare Other | Attending: Internal Medicine | Admitting: Internal Medicine

## 2011-10-27 ENCOUNTER — Encounter (HOSPITAL_COMMUNITY): Payer: Self-pay | Admitting: Cardiology

## 2011-10-27 DIAGNOSIS — E876 Hypokalemia: Secondary | ICD-10-CM | POA: Diagnosis present

## 2011-10-27 DIAGNOSIS — K589 Irritable bowel syndrome without diarrhea: Secondary | ICD-10-CM

## 2011-10-27 DIAGNOSIS — F0390 Unspecified dementia without behavioral disturbance: Secondary | ICD-10-CM

## 2011-10-27 DIAGNOSIS — S5000XA Contusion of unspecified elbow, initial encounter: Secondary | ICD-10-CM

## 2011-10-27 DIAGNOSIS — R930 Abnormal findings on diagnostic imaging of skull and head, not elsewhere classified: Secondary | ICD-10-CM

## 2011-10-27 DIAGNOSIS — Z880 Allergy status to penicillin: Secondary | ICD-10-CM

## 2011-10-27 DIAGNOSIS — N200 Calculus of kidney: Secondary | ICD-10-CM

## 2011-10-27 DIAGNOSIS — K222 Esophageal obstruction: Secondary | ICD-10-CM

## 2011-10-27 DIAGNOSIS — R1310 Dysphagia, unspecified: Secondary | ICD-10-CM

## 2011-10-27 DIAGNOSIS — F411 Generalized anxiety disorder: Secondary | ICD-10-CM

## 2011-10-27 DIAGNOSIS — Z23 Encounter for immunization: Secondary | ICD-10-CM

## 2011-10-27 DIAGNOSIS — Z888 Allergy status to other drugs, medicaments and biological substances status: Secondary | ICD-10-CM

## 2011-10-27 DIAGNOSIS — E119 Type 2 diabetes mellitus without complications: Secondary | ICD-10-CM | POA: Diagnosis present

## 2011-10-27 DIAGNOSIS — Z789 Other specified health status: Secondary | ICD-10-CM

## 2011-10-27 DIAGNOSIS — M810 Age-related osteoporosis without current pathological fracture: Secondary | ICD-10-CM

## 2011-10-27 DIAGNOSIS — I771 Stricture of artery: Secondary | ICD-10-CM

## 2011-10-27 DIAGNOSIS — J42 Unspecified chronic bronchitis: Secondary | ICD-10-CM

## 2011-10-27 DIAGNOSIS — M1990 Unspecified osteoarthritis, unspecified site: Secondary | ICD-10-CM

## 2011-10-27 DIAGNOSIS — Z7902 Long term (current) use of antithrombotics/antiplatelets: Secondary | ICD-10-CM

## 2011-10-27 DIAGNOSIS — E785 Hyperlipidemia, unspecified: Secondary | ICD-10-CM | POA: Diagnosis present

## 2011-10-27 DIAGNOSIS — K219 Gastro-esophageal reflux disease without esophagitis: Secondary | ICD-10-CM | POA: Diagnosis present

## 2011-10-27 DIAGNOSIS — I2510 Atherosclerotic heart disease of native coronary artery without angina pectoris: Secondary | ICD-10-CM | POA: Diagnosis present

## 2011-10-27 DIAGNOSIS — N390 Urinary tract infection, site not specified: Principal | ICD-10-CM | POA: Diagnosis present

## 2011-10-27 DIAGNOSIS — W19XXXA Unspecified fall, initial encounter: Secondary | ICD-10-CM | POA: Diagnosis present

## 2011-10-27 DIAGNOSIS — G309 Alzheimer's disease, unspecified: Secondary | ICD-10-CM | POA: Diagnosis present

## 2011-10-27 DIAGNOSIS — F0280 Dementia in other diseases classified elsewhere without behavioral disturbance: Secondary | ICD-10-CM | POA: Diagnosis present

## 2011-10-27 DIAGNOSIS — Z951 Presence of aortocoronary bypass graft: Secondary | ICD-10-CM

## 2011-10-27 DIAGNOSIS — I739 Peripheral vascular disease, unspecified: Secondary | ICD-10-CM | POA: Diagnosis present

## 2011-10-27 DIAGNOSIS — I10 Essential (primary) hypertension: Secondary | ICD-10-CM | POA: Diagnosis present

## 2011-10-27 LAB — DIFFERENTIAL
Eosinophils Relative: 1 % (ref 0–5)
Lymphocytes Relative: 17 % (ref 12–46)
Lymphs Abs: 1.5 10*3/uL (ref 0.7–4.0)
Monocytes Absolute: 1 10*3/uL (ref 0.1–1.0)

## 2011-10-27 LAB — CBC
HCT: 38.1 % (ref 36.0–46.0)
HCT: 35.1 % — ABNORMAL LOW (ref 36.0–46.0)
Hemoglobin: 11.6 g/dL — ABNORMAL LOW (ref 12.0–15.0)
MCH: 31 pg (ref 26.0–34.0)
MCV: 92.5 fL (ref 78.0–100.0)
MCV: 93.9 fL (ref 78.0–100.0)
RBC: 4.12 MIL/uL (ref 3.87–5.11)
RBC: 3.74 MIL/uL — ABNORMAL LOW (ref 3.87–5.11)
RDW: 12.8 % (ref 11.5–15.5)
WBC: 8.6 10*3/uL (ref 4.0–10.5)

## 2011-10-27 LAB — URINALYSIS, ROUTINE W REFLEX MICROSCOPIC
Nitrite: NEGATIVE
Specific Gravity, Urine: 1.025 (ref 1.005–1.030)
Urobilinogen, UA: 0.2 mg/dL (ref 0.0–1.0)

## 2011-10-27 LAB — COMPREHENSIVE METABOLIC PANEL
CO2: 27 mEq/L (ref 19–32)
Calcium: 9.5 mg/dL (ref 8.4–10.5)
Creatinine, Ser: 0.81 mg/dL (ref 0.50–1.10)
GFR calc Af Amer: 77 mL/min — ABNORMAL LOW (ref >90)
GFR calc non Af Amer: 67 mL/min — ABNORMAL LOW (ref >90)
Glucose, Bld: 129 mg/dL — ABNORMAL HIGH (ref 70–99)

## 2011-10-27 LAB — GLUCOSE, CAPILLARY: Glucose-Capillary: 104 mg/dL — ABNORMAL HIGH (ref 70–99)

## 2011-10-27 LAB — MAGNESIUM: Magnesium: 1.8 mg/dL (ref 1.5–2.5)

## 2011-10-27 LAB — CREATININE, SERUM: Creatinine, Ser: 0.8 mg/dL (ref 0.50–1.10)

## 2011-10-27 MED ORDER — SODIUM CHLORIDE 0.9 % IV SOLN
INTRAVENOUS | Status: AC
  Administered 2011-10-27 – 2011-10-28 (×2): via INTRAVENOUS

## 2011-10-27 MED ORDER — METOPROLOL SUCCINATE ER 25 MG PO TB24
25.0000 mg | ORAL_TABLET | Freq: Every day | ORAL | Status: DC
  Administered 2011-10-27 – 2011-11-02 (×6): 25 mg via ORAL
  Filled 2011-10-27 (×7): qty 1

## 2011-10-27 MED ORDER — INSULIN ASPART 100 UNIT/ML ~~LOC~~ SOLN
0.0000 [IU] | Freq: Three times a day (TID) | SUBCUTANEOUS | Status: DC
  Administered 2011-10-28 – 2011-10-31 (×7): 2 [IU] via SUBCUTANEOUS
  Administered 2011-10-31 (×2): 3 [IU] via SUBCUTANEOUS
  Administered 2011-11-01 – 2011-11-02 (×4): 2 [IU] via SUBCUTANEOUS
  Filled 2011-10-27 (×3): qty 3

## 2011-10-27 MED ORDER — SENNOSIDES-DOCUSATE SODIUM 8.6-50 MG PO TABS: 1.0000 | ORAL_TABLET | Freq: Every evening | ORAL | Status: DC | PRN

## 2011-10-27 MED ORDER — LOSARTAN POTASSIUM 50 MG PO TABS
50.0000 mg | ORAL_TABLET | Freq: Every day | ORAL | Status: DC
  Administered 2011-10-27 – 2011-11-02 (×7): 50 mg via ORAL
  Filled 2011-10-27 (×7): qty 1

## 2011-10-27 MED ORDER — ROSUVASTATIN CALCIUM 10 MG PO TABS
10.0000 mg | ORAL_TABLET | Freq: Every day | ORAL | Status: DC
  Administered 2011-10-27 – 2011-11-02 (×7): 10 mg via ORAL
  Filled 2011-10-27 (×7): qty 1

## 2011-10-27 MED ORDER — AMLODIPINE BESYLATE 5 MG PO TABS
5.0000 mg | ORAL_TABLET | Freq: Every day | ORAL | Status: DC
  Administered 2011-10-27 – 2011-10-28 (×2): 5 mg via ORAL
  Filled 2011-10-27 (×3): qty 1

## 2011-10-27 MED ORDER — CLOPIDOGREL BISULFATE 75 MG PO TABS
75.0000 mg | ORAL_TABLET | Freq: Every day | ORAL | Status: DC
  Administered 2011-10-27 – 2011-11-02 (×7): 75 mg via ORAL
  Filled 2011-10-27 (×9): qty 1

## 2011-10-27 MED ORDER — ALUM & MAG HYDROXIDE-SIMETH 200-200-20 MG/5ML PO SUSP: 30.0000 mL | Freq: Four times a day (QID) | ORAL | Status: DC | PRN

## 2011-10-27 MED ORDER — ONDANSETRON HCL 4 MG PO TABS: 4.0000 mg | ORAL_TABLET | Freq: Four times a day (QID) | ORAL | Status: DC | PRN

## 2011-10-27 MED ORDER — DEXTROSE 5 % IV SOLN: 1.0000 g | INTRAVENOUS | Status: DC

## 2011-10-27 MED ORDER — ACETAMINOPHEN 325 MG PO TABS
650.0000 mg | ORAL_TABLET | Freq: Four times a day (QID) | ORAL | Status: DC | PRN
  Administered 2011-10-27 – 2011-10-31 (×3): 650 mg via ORAL
  Filled 2011-10-27 (×3): qty 2

## 2011-10-27 MED ORDER — TRAMADOL HCL 50 MG PO TABS
50.0000 mg | ORAL_TABLET | Freq: Four times a day (QID) | ORAL | Status: DC | PRN
  Administered 2011-11-01: 50 mg via ORAL
  Filled 2011-10-27 (×2): qty 1

## 2011-10-27 MED ORDER — PANTOPRAZOLE SODIUM 40 MG PO TBEC
40.0000 mg | DELAYED_RELEASE_TABLET | Freq: Every day | ORAL | Status: DC
  Administered 2011-10-27 – 2011-11-02 (×5): 40 mg via ORAL
  Filled 2011-10-27 (×5): qty 1

## 2011-10-27 MED ORDER — ONDANSETRON HCL 4 MG/2ML IJ SOLN: 4.0000 mg | Freq: Four times a day (QID) | INTRAMUSCULAR | Status: DC | PRN

## 2011-10-27 MED ORDER — ACETAMINOPHEN 650 MG RE SUPP: 650.0000 mg | Freq: Four times a day (QID) | RECTAL | Status: DC | PRN

## 2011-10-27 MED ORDER — ENOXAPARIN SODIUM 40 MG/0.4ML ~~LOC~~ SOLN
40.0000 mg | SUBCUTANEOUS | Status: DC
  Administered 2011-10-27 – 2011-11-01 (×6): 40 mg via SUBCUTANEOUS
  Filled 2011-10-27 (×7): qty 0.4

## 2011-10-27 MED ORDER — DEXTROSE 5 % IV SOLN
1.0000 g | INTRAVENOUS | Status: DC
  Administered 2011-10-27 – 2011-11-02 (×7): 1 g via INTRAVENOUS
  Filled 2011-10-27 (×7): qty 10

## 2011-10-27 MED ORDER — AMLODIPINE-ATORVASTATIN 5-20 MG PO TABS: 1.0000 | ORAL_TABLET | Freq: Every day | ORAL | Status: DC

## 2011-10-27 MED ORDER — CLORAZEPATE DIPOTASSIUM 7.5 MG PO TABS: 7.5000 mg | ORAL_TABLET | Freq: Three times a day (TID) | ORAL | Status: DC | PRN

## 2011-10-27 MED ORDER — BUMETANIDE 0.5 MG PO TABS
0.5000 mg | ORAL_TABLET | Freq: Every day | ORAL | Status: DC
  Administered 2011-10-27 – 2011-11-02 (×7): 0.5 mg via ORAL
  Filled 2011-10-27 (×7): qty 1

## 2011-10-27 MED ORDER — SODIUM CHLORIDE 0.9 % IV SOLN
Freq: Once | INTRAVENOUS | Status: AC
  Administered 2011-10-27: 10:00:00 via INTRAVENOUS

## 2011-10-27 NOTE — ED Provider Notes (Addendum)
History     CSN: [id number]  Arrival date & time 10/27/11  0714   First MD Initiated Contact with Patient 10/27/11 [phone]      Chief Complaint  Patient presents with  . Fall    (Consider location/radiation/quality/duration/timing/severity/associated sxs/prior treatment) Patient is a 77 y.o. female presenting with fall. The history is provided by the patient.  Fall  pt fell while attempting to walk. No loc. No sx prior to fall.  Denies chest pain, sob, abd pain. No numbness. Pain to bilateralteral elbows worse on left. No hip or neck pain. Pain is aching and worse with movement. No meds taken pta  Past Medical History  Diagnosis Date  . Bronchitis, mucopurulent recurrent   . Hematoma     Large hematoma from fall, November, 2009, Coumadin stopped  . Pulmonary embolism   . Hypertension   . CAD (coronary artery disease)     2008, question slight anteroapical ischemia  . Subclavian arterial stenosis     PCI  . Hyperlipidemia   . Diabetes mellitus   . GERD (gastroesophageal reflux disease)   . IBS (irritable bowel syndrome)   . Nephrolithiasis   . DJD (degenerative joint disease)   . Right knee injury   . Osteoporosis   . Senile dementia with delusional features   . Anxiety   . Palpitations   . Hiatal hernia   . Hemorrhoids   . Obesity   . SOB (shortness of breath)   . Esophageal stricture   . Ejection fraction     EF 70%, nuclear, 2007  . Hx of CABG     2003, ... LIMA not used... small caliber  . Carotid artery disease     Doppler, December, 2007, bilateral 0-39%  . Aspirin intolerance   . Dementia     Senile dementia with delusional features  . Warfarin anticoagulation     stopped 09/2008 with large hematoma from fall    Past Surgical History  Procedure Date  . Appendectomy   . Cataract extraction   . Hemorrhoid surgery   . Abdominal hysterectomy   . Cabg  1 vessel w/svg to lad 2003  . Mult orthopedic procedures     Dr. Arnette Norris arm, CTS,cyst removal    . Open heart surgery     Family History  Problem Relation Age of Onset  . Stroke Father   . Cancer Mother     bladder cancer  . Hypertension Mother   . Stroke Mother   . Leukemia Sister   . Colon cancer Neg Hx     History  Substance Use Topics  . Smoking status: Never Smoker   . Smokeless tobacco: Not on file  . Alcohol Use: No    OB History    Grav Para Term Preterm Abortions TAB SAB Ect Mult Living                  Review of Systems  All other systems reviewed and are negative.    Allergies  Aspirin; Iodine; Irbesartan-hydrochlorothiazide; and Penicillins  Home Medications   Current Outpatient Rx  Name Route Sig Dispense Refill  . AMLODIPINE-ATORVASTATIN 5-20 MG PO TABS Oral Take 1 tablet by mouth daily. 30 tablet 11  . BUMETANIDE 0.5 MG PO TABS Oral Take 1 tablet (0.5 mg total) by mouth daily. 30 tablet 11  . CLOPIDOGREL BISULFATE 75 MG PO TABS  TAKE 1 TABLET BY MOUTH EVERY DAY 30 tablet 6  . CLORAZEPATE DIPOTASSIUM 7.5 MG PO TABS  Take 1/2 to 1 tablet by mouth three times a day as needed for anxiety     . GLIMEPIRIDE 1 MG PO TABS  Take 1/2 tablet by mouth once daily 30 tablet 5  . IBANDRONATE SODIUM 150 MG PO TABS Oral Take 1 tablet (150 mg total) by mouth every 30 (thirty) days. Take in the morning with a full glass of water, on an empty stomach, and do not take anything else by mouth or lie down for the next 30 min. 1 tablet 11  . LOSARTAN POTASSIUM 50 MG PO TABS Oral Take 1 tablet (50 mg total) by mouth daily. 30 tablet 11  . METFORMIN HCL 500 MG PO TABS Oral Take 1 tablet (500 mg total) by mouth daily with breakfast. 30 tablet 5  . METOPROLOL SUCCINATE ER 25 MG PO TB24 Oral Take 1 tablet (25 mg total) by mouth daily. 30 tablet 11  . OMEPRAZOLE 20 MG PO CPDR Oral Take 1 capsule (20 mg total) by mouth daily. 30 capsule 11    Pt needs follow up with Dr. Kriste Basque for refills    BP 148/106  Pulse 91  Temp(Src) 98.4 F (36.9 C) (Oral)  Resp 22  SpO2  96%  Physical Exam  Nursing note and vitals reviewed. Constitutional: She is oriented to person, place, and time. Vital signs are normal. She appears well-developed and well-nourished.  Non-toxic appearance. No distress.  HENT:  Head: Normocephalic and atraumatic.  Eyes: Conjunctivae, EOM and lids are normal. Pupils are equal, round, and reactive to light.  Neck: Normal range of motion. Neck supple. No tracheal deviation present. No mass present.  Cardiovascular: Normal rate, regular rhythm and normal heart sounds.  Exam reveals no gallop.   No murmur heard. Pulmonary/Chest: Effort normal and breath sounds normal. No stridor. No respiratory distress. She has no decreased breath sounds. She has no wheezes. She has no rhonchi. She has no rales.  Abdominal: Soft. Normal appearance and bowel sounds are normal. She exhibits no distension. There is no tenderness. There is no rebound and no CVA tenderness.  Musculoskeletal: Normal range of motion. She exhibits no edema and no tenderness.       Arms: Neurological: She is alert and oriented to person, place, and time. She has normal strength. No cranial nerve deficit or sensory deficit. GCS eye subscore is 4. GCS verbal subscore is 5. GCS motor subscore is 6.  Skin: Skin is warm and dry. No abrasion and no rash noted.  Psychiatric: She has a normal mood and affect. Her speech is normal and behavior is normal.    ED Course  Procedures (including critical care time)  Labs Reviewed - No data to display No results found.   No diagnosis found.    MDM  Elbow xrays negative-- 10:01 AM Patient was only discharged home when I spoke to her brother who states that patient does have mild dementia and that patient is unable to take care of herself at home at this time. Will order labs and x-rays and will have patient admitted for  placement       Toy Baker, MD 10/27/11 1610  Toy Baker, MD 10/27/11 1002  Toy Baker,  MD 10/27/11 [phone]

## 2011-10-27 NOTE — ED Notes (Signed)
Pt to department via EMS from home. Pt reports that she was getting up to go to the bathroom and fell last night. Unknown LOC. Bp- 114/84 Hr- 86. No acute distress noted. CBG- 138

## 2011-10-27 NOTE — ED Notes (Signed)
109 CBG

## 2011-10-27 NOTE — ED Notes (Signed)
Pt Brother- [phone]                    [phone]

## 2011-10-27 NOTE — Progress Notes (Signed)
ED CSW NOTE:  CSW  received call from ED Dept re: placement options for pt as she is unsafe to return back to her prior living situation. Per chart review, pt is with cognitive deficits and freq falls. CSW placed call to pts family and completed assessment. Refer to yellow psychosocial assessment in shadow box.   Family Contact Information:  POA/Sister Lesle Reek  [phone]  Brother/Care Giver who resides next door Vernia Buff  (home) [phone] (cell) [phone]  Niece/Secondary POA Clara Profit [phone]  Dionne Milo MSW, LCSWA [facility] Dept. Weekend/Social Worker [phone]

## 2011-10-27 NOTE — H&P (Signed)
[name] MRN: [id number] DOB/AGE: 75-Apr-1930 75 y.o. Primary Care Physician:NADEL,SCOTT M, MD, MD Admit date: 10/27/2011 Chief Complaint: Marletta Lor HPI:  [name] is a 77yo WF with complex PMHx including coronary artery disease status post CABG, hyperlipidemia, diabetes, gastroesophageal reflux disease, senile dementia with paranoia, peripheral vascular disease, irritable bowel syndrome who presents to the ED with a fall. Patient does state that over the past few days had been having some dizzy spells where she felt the room was spinning which have since resolved. Patient stated that she was going to the bathroom when she turned quickly and fell on her face. Patient denies any syncope. Patient states that her brother who lives next door to her and helps take care of her happened to be in her home and heard her fall and picked her up off the floor, and subsequently brought her to the emergency room. Patient complains of pain in the elbows and the hands. Patient denied any recent fevers no chills no chest pain no shortness of breath, no nausea, no vomiting, no abdominal pain, no dysuria, no focal neurological symptoms. Patient does endorse that she had some congestion recently. Patient also with some weakness which is generalized. Patient was seen in the ED x-rays of her elbows were done which were negative for any acute fracture or effusion. Urinalysis was done was consistent with a probable urinary tract infection. Comprehensive metabolic profile which was done did have an albumin of 3.2 and AST of 39 otherwise was within normal limits. CBC which was done had a platelet count of 121 otherwise was within normal limits. CT of the head was done and was negative for any acute abnormalities. Chest x-ray which was done was unremarkable. Per ED physician family felt they could no longer help take care of the patient and a such was also requesting possible placement. We were called to admit the patient for  further evaluation and management.  Past Medical History  Diagnosis Date  . Bronchitis, mucopurulent recurrent   . Hematoma     Large hematoma from fall, November, 2009, Coumadin stopped  . Pulmonary embolism   . Hypertension   . CAD (coronary artery disease)     2008, question slight anteroapical ischemia  . Subclavian arterial stenosis     PCI  . Hyperlipidemia   . Diabetes mellitus   . GERD (gastroesophageal reflux disease)   . IBS (irritable bowel syndrome)   . Nephrolithiasis   . DJD (degenerative joint disease)   . Right knee injury   . Osteoporosis   . Senile dementia with delusional features   . Anxiety   . Palpitations   . Hiatal hernia   . Hemorrhoids   . Obesity   . SOB (shortness of breath)   . Esophageal stricture   . Ejection fraction     EF 70%, nuclear, 2007  . Hx of CABG     2003, ... LIMA not used... small caliber  . Carotid artery disease     Doppler, December, 2007, bilateral 0-39%  . Aspirin intolerance   . Dementia     Senile dementia with delusional features  . Warfarin anticoagulation     stopped 09/2008 with large hematoma from fall    Past Surgical History  Procedure Date  . Appendectomy   . Cataract extraction   . Hemorrhoid surgery   . Abdominal hysterectomy   . Cabg  1 vessel w/svg to lad 2003  . Mult orthopedic procedures     Dr. Arnette Norris  arm, CTS,cyst removal  . Open heart surgery     Prior to Admission medications   Medication Sig Start Date End Date Taking? Authorizing Provider  amLODipine-atorvastatin (CADUET) 5-20 MG per tablet Take 1 tablet by mouth daily. 06/18/11  Yes Michele Mcalpine, MD  bumetanide (BUMEX) 0.5 MG tablet Take 1 tablet (0.5 mg total) by mouth daily. 06/18/11  Yes Michele Mcalpine, MD  clopidogrel (PLAVIX) 75 MG tablet TAKE 1 TABLET BY MOUTH EVERY DAY 10/16/11  Yes Luis Abed, MD  clorazepate (TRANXENE) 7.5 MG tablet Take 1/2 to 1 tablet by mouth three times a day as needed for anxiety    Yes Historical  Provider, MD  glimepiride (AMARYL) 1 MG tablet Take 1/2 tablet by mouth once daily 06/18/11  Yes Michele Mcalpine, MD  ibandronate (BONIVA) 150 MG tablet Take 1 tablet (150 mg total) by mouth every 30 (thirty) days. Take in the morning with a full glass of water, on an empty stomach, and do not take anything else by mouth or lie down for the next 30 min. 06/18/11  Yes Michele Mcalpine, MD  losartan (COZAAR) 50 MG tablet Take 1 tablet (50 mg total) by mouth daily. 06/18/11  Yes Michele Mcalpine, MD  metFORMIN (GLUCOPHAGE) 500 MG tablet Take 1 tablet (500 mg total) by mouth daily with breakfast. 06/18/11  Yes Michele Mcalpine, MD  metoprolol succinate (TOPROL XL) 25 MG 24 hr tablet Take 1 tablet (25 mg total) by mouth daily. 06/18/11 06/17/12 Yes Michele Mcalpine, MD  omeprazole (PRILOSEC) 20 MG capsule Take 1 capsule (20 mg total) by mouth daily. 06/18/11  Yes Michele Mcalpine, MD    Allergies:  Allergies  Allergen Reactions  . Aspirin     REACTION: pt states GI INTOL to aspirin  . Iodine     REACTION: GI UPSET  . Irbesartan-Hydrochlorothiazide     REACTION: GI UPSET  . Penicillins     REACTION: GI UPSET    Family History  Problem Relation Age of Onset  . Stroke Father   . Cancer Mother     bladder cancer  . Hypertension Mother   . Stroke Mother   . Leukemia Sister   . Colon cancer Neg Hx     Social History:  reports that she has never smoked. She does not have any smokeless tobacco history on file. She reports that she does not drink alcohol or use illicit drugs.  ROS: All systems reviewed with the patient and was positive as per HPI otherwise all other systems are negative.  PHYSICAL EXAM: Blood pressure 119/46, pulse 87, temperature 98.4 F (36.9 C), temperature source Oral, resp. rate 20, SpO2 95.00%. General: Well-developed well-nourished in no acute cardiopulmonary distress. HEENT: Normocephalic atraumatic. Pupils minimally reactive to light and accommodation. Extraocular movements intact.  Oropharynx is clear, no lesions, no exudates. Dry mucous membranes. Neck is supple with no lymphadenopathy. No bruits, no goiter. Heart: Regular rate and rhythm, without murmurs, rubs, gallops. Lungs: Clear to auscultation bilaterally. Upper airway noise. Abdomen: Soft, nontender, nondistended, positive bowel sounds. Extremities: No clubbing cyanosis or edema with positive pedal pulses. Neuro: Alert and oriented x3. Cranial nerves II through XII were grossly intact.   EKG: Normal sinus rhythm.  No results found for this or any previous visit (from the past 240 hour(s)).   Lab results:  Basename 10/27/11 1020  NA 142  K 3.8  CL 105  CO2 27  GLUCOSE 129*  BUN 16  CREATININE 0.81  CALCIUM 9.5  MG --  PHOS --    Basename 10/27/11 1020  AST 39*  ALT 23  ALKPHOS 48  BILITOT 0.5  PROT 6.6  ALBUMIN 3.2*   No results found for this basename: LIPASE:2,AMYLASE:2 in the last 72 hours  Basename 10/27/11 1020  WBC 8.6  NEUTROABS 6.0  HGB 12.9  HCT 38.1  MCV 92.5  PLT 121*   No results found for this basename: CKTOTAL:3,CKMB:3,CKMBINDEX:3,TROPONINI:3 in the last 72 hours No components found with this basename: POCBNP:3 No results found for this basename: DDIMER in the last 72 hours No results found for this basename: HGBA1C:2 in the last 72 hours No results found for this basename: CHOL:2,HDL:2,LDLCALC:2,TRIG:2,CHOLHDL:2,LDLDIRECT:2 in the last 72 hours No results found for this basename: TSH,T4TOTAL,FREET3,T3FREE,THYROIDAB in the last 72 hours No results found for this basename: VITAMINB12:2,FOLATE:2,FERRITIN:2,TIBC:2,IRON:2,RETICCTPCT:2 in the last 72 hours Imaging results:  Dg Chest 2 View  10/27/2011  *RADIOLOGY REPORT*  Clinical Data: Left chest pain, dizziness  CHEST - 2 VIEW  Comparison: 07/19/2011  Findings: Previous CABG.  Vascular clips in the right upper abdomen.  No pneumothorax. Lungs clear.  Heart size and pulmonary vascularity normal.  No effusion.  Visualized  bones unremarkable.  IMPRESSION: No acute disease post CABG  Original Report Authenticated By: Osa Craver, M.D.   Dg Elbow Complete Left  10/27/2011  *RADIOLOGY REPORT*  Clinical Data: Bilateral elbow pain.  Trauma.  Fall.  LEFT ELBOW - COMPLETE 3+ VIEW  Comparison: None.  Findings: Anatomic alignment.  No fracture.  Radiocapitellar osteoarthritis is present with small spur of the lateral aspect of the radius.  There is no elbow effusion identified.  IMPRESSION: No displaced fracture or elbow joint effusion.  Mild osteoarthritis.  Original Report Authenticated By: Andreas Newport, M.D.   Dg Elbow Complete Right  10/27/2011  *RADIOLOGY REPORT*  Clinical Data: Fall.  Elbow pain.  RIGHT ELBOW - COMPLETE 3+ VIEW  Comparison: 12/09/2003.  Findings: ORIF of the right elbow is present, little changed from prior examination.  Post-traumatic deformity of the elbow.  No hardware failure is identified.  No elbow effusion.  The radial head appears to have been resected.  IMPRESSION: Post-traumatic changes of the right elbow status post fixation.  No acute osseous abnormality.  Original Report Authenticated By: Andreas Newport, M.D.   Ct Head Wo Contrast  10/27/2011  *RADIOLOGY REPORT*  Clinical Data: Fall.  Head trauma.  CT HEAD WITHOUT CONTRAST  Technique:  Contiguous axial images were obtained from the base of the skull through the vertex without contrast.  Comparison: 07/21/2011.  Findings: No mass lesion, mass effect, midline shift, hydrocephalus, hemorrhage.  No acute territorial cortical ischemia/infarct. Atrophy and chronic ischemic white matter disease is present.  Study is mildly degraded by motion artifact.  Repeat imaging was performed with some improvement.  Dilated perivascular spaces are present.  Hyperostosis frontalis interna.  No skull fracture is identified.  Scattered punctate parenchymal calcifications are present.  IMPRESSION: Atrophy and chronic ischemic white matter disease without  acute intracranial abnormality.  Original Report Authenticated By: Andreas Newport, M.D.   Impression/Plan:  Principal Problem:  *UTI (lower urinary tract infection) Active Problems:  Fall  Diabetes mellitus  GERD (gastroesophageal reflux disease)  Hypertension  Hyperlipidemia  #1 urinary tract infection Will check urine cultures. We'll place on IV Rocephin. Follow. #2 fall Mechanical in nature. Patient denies any syncope. No chest pain. PT OT. Fall precautions. #3 diabetes mellitus Check a hemoglobin A1c. We'll hold oral  hypoglycemics. Sliding scale insulin. #4 gastroesophageal reflux disease Proton pump inhibitor #5 hypertension Continue home regimen of Norvasc, bumetanide, metoprolol, losartan. #6 hyperlipidemia Continue atorvastatin. #7 coronary artery disease status post CABG Stable. Continue metoprolol, losartan, Plavix, Caduet. #8 prophylaxis Protonix for GI prophylaxis, Lovenox for DVT prophylaxis. #9 dementia #10 disposition We'll consult with social work for possible placement per ED physician who stated that family wanted  placement.   THOMPSON,DANIEL 10/27/2011, 1:32 PM

## 2011-10-28 LAB — CBC
Hemoglobin: 11.6 g/dL — ABNORMAL LOW (ref 12.0–15.0)
MCH: 30.5 pg (ref 26.0–34.0)
MCHC: 32.3 g/dL (ref 30.0–36.0)

## 2011-10-28 LAB — GLUCOSE, CAPILLARY
Glucose-Capillary: 114 mg/dL — ABNORMAL HIGH (ref 70–99)
Glucose-Capillary: 118 mg/dL — ABNORMAL HIGH (ref 70–99)

## 2011-10-28 LAB — BASIC METABOLIC PANEL
BUN: 11 mg/dL (ref 6–23)
GFR calc non Af Amer: 78 mL/min — ABNORMAL LOW (ref >90)
Glucose, Bld: 126 mg/dL — ABNORMAL HIGH (ref 70–99)
Potassium: 3.8 mEq/L (ref 3.5–5.1)

## 2011-10-28 LAB — URINE CULTURE
Colony Count: 7000
Culture  Setup Time: 201212230206

## 2011-10-28 MED ORDER — WHITE PETROLATUM GEL
Status: AC
  Administered 2011-10-28: 08:00:00
  Filled 2011-10-28: qty 5

## 2011-10-28 MED ORDER — ALBUTEROL SULFATE (5 MG/ML) 0.5% IN NEBU
2.5000 mg | INHALATION_SOLUTION | RESPIRATORY_TRACT | Status: DC | PRN
  Administered 2011-10-29: 2.5 mg via RESPIRATORY_TRACT
  Filled 2011-10-28: qty 0.5

## 2011-10-28 NOTE — Progress Notes (Signed)
Physical Therapy Evaluation Patient Details Name: [name] MRN: [id number] DOB: 1929-01-06 Today's Date: 10/28/2011  Problem List:  Patient Active Problem List  Diagnoses  . SENILE DEMENTIA WITH DELUSIONAL FEATURES  . ANXIETY  . PERIPHERAL VASCULAR DISEASE  . BRONCHITIS, RECURRENT  . ESOPHAGEAL STRICTURE  . IBS  . NEPHROLITHIASIS  . DEGENERATIVE JOINT DISEASE  . OSTEOPOROSIS  . DYSPHAGIA UNSPECIFIED  . Nonspecific (abnormal) findings on radiological and other examination of body structure  . ABNORMAL CHEST XRAY  . Subclavian arterial stenosis  . Diabetes mellitus  . GERD (gastroesophageal reflux disease)  . Hx of CABG  . Carotid artery disease  . Aspirin intolerance  . CAD (coronary artery disease)  . Hypertension  . Hyperlipidemia  . UTI (lower urinary tract infection)  . Fall    Past Medical History:  Past Medical History  Diagnosis Date  . Bronchitis, mucopurulent recurrent   . Hematoma     Large hematoma from fall, November, 2009, Coumadin stopped  . Pulmonary embolism   . Hypertension   . CAD (coronary artery disease)     2008, question slight anteroapical ischemia  . Subclavian arterial stenosis     PCI  . Hyperlipidemia   . Diabetes mellitus   . GERD (gastroesophageal reflux disease)   . IBS (irritable bowel syndrome)   . Nephrolithiasis   . DJD (degenerative joint disease)   . Right knee injury   . Osteoporosis   . Senile dementia with delusional features   . Anxiety   . Palpitations   . Hiatal hernia   . Hemorrhoids   . Obesity   . SOB (shortness of breath)   . Esophageal stricture   . Ejection fraction     EF 70%, nuclear, 2007  . Hx of CABG     2003, ... LIMA not used... small caliber  . Carotid artery disease     Doppler, December, 2007, bilateral 0-39%  . Aspirin intolerance   . Dementia     Senile dementia with delusional features  . Warfarin anticoagulation     stopped 09/2008 with large hematoma from fall   Past Surgical  History:  Past Surgical History  Procedure Date  . Appendectomy   . Cataract extraction   . Hemorrhoid surgery   . Abdominal hysterectomy   . Cabg  1 vessel w/svg to lad 2003  . Mult orthopedic procedures     Dr. Arnette Norris arm, CTS,cyst removal  . Open heart surgery     PT Assessment/Plan/Recommendation PT Assessment Clinical Impression Statement: 77 year old female with UTI/falls, difficulty managing at home presents with decreased mobility and impairments listed below; wil benefit from PT to maximize independence and safety with mobility, decr fall risk, and facilitate dc planning; recommend SNF for rehab as a potential bridge to an assisted living situation PT Recommendation/Assessment: Patient will need skilled PT in the acute care venue PT Problem List: Decreased strength;Decreased balance;Decreased mobility;Decreased knowledge of use of DME;Decreased cognition;Pain PT Therapy Diagnosis : Generalized weakness PT Plan PT Frequency: Min 2X/week PT Treatment/Interventions: DME instruction;Gait training;Stair training;Functional mobility training;Therapeutic activities;Therapeutic exercise;Balance training;Patient/family education PT Recommendation Follow Up Recommendations: Skilled nursing facility Equipment Recommended: Defer to next venue PT Goals  Acute Rehab PT Goals PT Goal Formulation: With patient Time For Goal Achievement: 2 weeks Pt will go Supine/Side to Sit: with supervision Pt will go Sit to Supine/Side: with supervision Pt will go Sit to Stand: with supervision Pt will go Stand to Sit: with supervision Pt will Ambulate: >  150 feet;with supervision;with least restrictive assistive device Pt will Go Up / Down Stairs: 3-5 stairs;with supervision;with rail(s)  PT Evaluation Precautions/Restrictions  Precautions Precautions: Fall Prior Functioning  Home Living Lives With: Alone Receives Help From: Family (brother next door; can no longer meet her needs) Type of  Home: House Home Layout:  (NA -- pursuing SNF placement) Home Adaptive Equipment: Quad cane (straight cane) Prior Function Level of Independence:  (apparently having trouble managing safey at home) Cognition Cognition Arousal/Alertness: Awake/alert Overall Cognitive Status: History of cognitive impairments History of Cognitive Impairment: Appears at baseline functioning Orientation Level: Oriented to person;Oriented to place;Oriented to situation Cognition - Other Comments: seems aware of defecits and risk of falls; was aggreeable to rehab at SNF when we discussed dispo Sensation/Coordination Sensation Light Touch: Appears Intact Coordination Gross Motor Movements are Fluid and Coordinated: Yes Fine Motor Movements are Fluid and Coordinated: Not tested Extremity Assessment RUE Assessment RUE Assessment: Within Functional Limits LUE Assessment LUE Assessment: Within Functional Limits RLE Assessment RLE Assessment: Exceptions to [facility] RLE Strength RLE Overall Strength Comments: grossly decreased strength, requiring mon physical assist for sit to stand LLE Assessment LLE Assessment: Exceptions to [facility] LLE Strength LLE Overall Strength Comments: grossly decreased strength, requiring mon physical assist for sit to stand Mobility (including Balance) Bed Mobility Bed Mobility: Yes Supine to Sit: 4: Min assist;HOB elevated (Comment degrees) (30) Supine to Sit Details (indicate cue type and reason): cues to initiate; pretty smooth transitions Transfers Transfers: Yes Sit to Stand: 4: Min assist;From bed;With upper extremity assist Sit to Stand Details (indicate cue type and reason): cues for safe hand plaecement Stand to Sit: 4: Min assist Stand to Sit Details: cues for safe hand placement, and to control descent Ambulation/Gait Ambulation/Gait: Yes Ambulation/Gait Assistance: 4: Min assist (with and without physical contact) Ambulation/Gait Assistance Details (indicate cue type and  reason): cues for upright posture and for RW proximity Ambulation Distance (Feet): 130 Feet Assistive device: Rolling walker    Exercise    End of Session PT - End of Session Equipment Utilized During Treatment: Gait belt Activity Tolerance: Patient tolerated treatment well Patient left: in chair;with call bell in reach Nurse Communication: Mobility status for transfers;Mobility status for ambulation General Behavior During Session: [facility] for tasks performed Cognition: [facility] for tasks performed (for very simple mobility tasks performed)  Van Clines [facility] [place], [place] [phone]  10/28/2011, 1:06 PM

## 2011-10-28 NOTE — Progress Notes (Signed)
PATIENT DETAILS Name: [name] Age: 77 y.o. Sex: female Date of Birth: Mar 16, 1929 Admit Date: 10/27/2011 ZOX:WRUEA,VWUJW M, MD, MD  Subjective: No major complaints patient.  Objective: Vital signs in last 24 hours: Filed Vitals:   10/27/11 1900 10/27/11 2110 10/28/11 0518 10/28/11 1053  BP: 123/43 95/55 136/72   Pulse: 67 68 74   Temp: 99.3 F (37.4 C) 98.8 F (37.1 C) 99.5 F (37.5 C)   TempSrc: Oral Oral Oral   Resp: 21 20 20    Height:      Weight:   77.61 kg (171 lb 1.6 oz)   SpO2: 92% 94% 92% 93%    Weight change:   Body mass index is 31.29 kg/(m^2).  Intake/Output from previous day:  Intake/Output Summary (Last 24 hours) at 10/28/11 1223 Last data filed at 10/28/11 0900  Gross per 24 hour  Intake 2385.42 ml  Output      0 ml  Net 2385.42 ml    PHYSICAL EXAM: Gen Exam: Awake and alert with clear speech.   Neck: Supple, No JVD.   Chest: B/L Clear with only some scattered rhonchi. CVS: S1 S2 Regular, no murmurs.  Abdomen: soft, BS +, non tender, non distended.  Extremities: no edema, lower extremities warm to touch. Neurologic: Non Focal.   Skin: No Rash.   Wounds: N/A.    CONSULTS:  none  LAB RESULTS: CBC  Lab 10/28/11 0702 10/27/11 1602 10/27/11 1020  WBC 5.1 6.7 8.6  HGB 11.6* 11.6* 12.9  HCT 35.9* 35.1* 38.1  PLT 98* 112* 121*  MCV 94.5 93.9 92.5  MCH 30.5 31.0 31.3  MCHC 32.3 33.0 33.9  RDW 13.4 13.3 12.8  LYMPHSABS -- -- 1.5  MONOABS -- -- 1.0  EOSABS -- -- 0.0  BASOSABS -- -- 0.0  BANDABS -- -- --    Chemistries   Lab 10/28/11 0702 10/27/11 1602 10/27/11 1020  NA 141 -- 142  K 3.8 -- 3.8  CL 108 -- 105  CO2 23 -- 27  GLUCOSE 126* -- 129*  BUN 11 -- 16  CREATININE 0.74 0.80 0.81  CALCIUM 8.3* -- 9.5  MG -- 1.8 --    GFR Estimated Creatinine Clearance: 54.1 ml/min (by C-G formula based on Cr of 0.74).  Coagulation profile No results found for this basename: INR:5,PROTIME:5 in the last 168 hours  Cardiac  Enzymes No results found for this basename: CK:3,CKMB:3,TROPONINI:3,MYOGLOBIN:3 in the last 168 hours  No components found with this basename: POCBNP:3 No results found for this basename: DDIMER:2 in the last 72 hours  Basename 10/27/11 1332  HGBA1C 6.6*   No results found for this basename: CHOL:2,HDL:2,LDLCALC:2,TRIG:2,CHOLHDL:2,LDLDIRECT:2 in the last 72 hours No results found for this basename: TSH,T4TOTAL,FREET3,T3FREE,THYROIDAB in the last 72 hours No results found for this basename: VITAMINB12:2,FOLATE:2,FERRITIN:2,TIBC:2,IRON:2,RETICCTPCT:2 in the last 72 hours No results found for this basename: LIPASE:2,AMYLASE:2 in the last 72 hours  Urine Studies No results found for this basename: UACOL:2,UAPR:2,USPG:2,UPH:2,UTP:2,UGL:2,UKET:2,UBIL:2,UHGB:2,UNIT:2,UROB:2,ULEU:2,UEPI:2,UWBC:2,URBC:2,UBAC:2,CAST:2,CRYS:2,UCOM:2,BILUA:2 in the last 72 hours  MICROBIOLOGY: No results found for this or any previous visit (from the past 240 hour(s)).  RADIOLOGY STUDIES/RESULTS: Dg Chest 2 View  10/27/2011  *RADIOLOGY REPORT*  Clinical Data: Left chest pain, dizziness  CHEST - 2 VIEW  Comparison: 07/19/2011  Findings: Previous CABG.  Vascular clips in the right upper abdomen.  No pneumothorax. Lungs clear.  Heart size and pulmonary vascularity normal.  No effusion.  Visualized bones unremarkable.  IMPRESSION: No acute disease post CABG  Original Report Authenticated By: Thora Lance  III, M.D.   Dg Elbow Complete Left  10/27/2011  *RADIOLOGY REPORT*  Clinical Data: Bilateral elbow pain.  Trauma.  Fall.  LEFT ELBOW - COMPLETE 3+ VIEW  Comparison: None.  Findings: Anatomic alignment.  No fracture.  Radiocapitellar osteoarthritis is present with small spur of the lateral aspect of the radius.  There is no elbow effusion identified.  IMPRESSION: No displaced fracture or elbow joint effusion.  Mild osteoarthritis.  Original Report Authenticated By: Andreas Newport, M.D.   Dg Elbow Complete  Right  10/27/2011  *RADIOLOGY REPORT*  Clinical Data: Fall.  Elbow pain.  RIGHT ELBOW - COMPLETE 3+ VIEW  Comparison: 12/09/2003.  Findings: ORIF of the right elbow is present, little changed from prior examination.  Post-traumatic deformity of the elbow.  No hardware failure is identified.  No elbow effusion.  The radial head appears to have been resected.  IMPRESSION: Post-traumatic changes of the right elbow status post fixation.  No acute osseous abnormality.  Original Report Authenticated By: Andreas Newport, M.D.   Ct Head Wo Contrast  10/27/2011  *RADIOLOGY REPORT*  Clinical Data: Fall.  Head trauma.  CT HEAD WITHOUT CONTRAST  Technique:  Contiguous axial images were obtained from the base of the skull through the vertex without contrast.  Comparison: 07/21/2011.  Findings: No mass lesion, mass effect, midline shift, hydrocephalus, hemorrhage.  No acute territorial cortical ischemia/infarct. Atrophy and chronic ischemic white matter disease is present.  Study is mildly degraded by motion artifact.  Repeat imaging was performed with some improvement.  Dilated perivascular spaces are present.  Hyperostosis frontalis interna.  No skull fracture is identified.  Scattered punctate parenchymal calcifications are present.  IMPRESSION: Atrophy and chronic ischemic white matter disease without acute intracranial abnormality.  Original Report Authenticated By: Andreas Newport, M.D.    MEDICATIONS: Scheduled Meds:   . amLODipine  5 mg Oral Daily  . bumetanide  0.5 mg Oral Daily  . cefTRIAXone (ROCEPHIN)  IV  1 g Intravenous Q24H  . clopidogrel  75 mg Oral Q breakfast  . enoxaparin  40 mg Subcutaneous Q24H  . insulin aspart  0-15 Units Subcutaneous TID WC  . losartan  50 mg Oral Daily  . metoprolol succinate  25 mg Oral Daily  . pantoprazole  40 mg Oral Q0600  . rosuvastatin  10 mg Oral Daily  . white petrolatum      . DISCONTD: amLODipine-atorvastatin  1 tablet Oral Daily  . DISCONTD: cefTRIAXone  (ROCEPHIN)  IV  1 g Intravenous Q24H   Continuous Infusions:   . sodium chloride 20 mL/hr at 10/28/11 1032   PRN Meds:.acetaminophen, acetaminophen, albuterol, alum & mag hydroxide-simeth, clorazepate, ondansetron (ZOFRAN) IV, ondansetron, senna-docusate, traMADol  Antibiotics: Anti-infectives     Start     Dose/Rate Route Frequency Ordered Stop   10/27/11 1400   cefTRIAXone (ROCEPHIN) 1 g in dextrose 5 % 50 mL IVPB  Status:  Discontinued        1 g 100 mL/hr over 30 Minutes Intravenous Every 24 hours 10/27/11 1346 10/27/11 1533   10/27/11 1245   cefTRIAXone (ROCEPHIN) 1 g in dextrose 5 % 50 mL IVPB        1 g 100 mL/hr over 30 Minutes Intravenous Every 24 hours 10/27/11 1232            Assessment/Plan: Patient Active Hospital Problem List: UTI (lower urinary tract infection)  Assessment: Afebrile, nontoxic looking.    Plan: Continue with Rocephin, await urine culture sensitivity.   Diabetes mellitus  Assessment: CBGs stable.    Plan: Continue with SSI for now, resume Amaryl on discharge.   GERD (gastroesophageal reflux disease   Assessment: Stable    Plan: Continue with PPI.   Hypertension    Assessment: Controlled.    Plan: Continue with amlodipine, metoprolol, Cozaar and Bumex.   Hyperlipidemia    Assessment: Stable, no indication to check lipids this admission.    Plan: Continue with statin.   Fall   Assessment: Possibly from generalized weakness from UTI.   Plan: Await physical therapy evaluation. May need placement on discharge-as the patient lives alone.  Disposition: Remain inpatient-may need SNF on discharge.  DVT Prophylaxis: Lovenox  Code Status: Full code  Maretta Bees, MD. 10/28/2011, 12:23 PM

## 2011-10-28 NOTE — Progress Notes (Signed)
10/28/11 0700 Patient complaining of SOB.  She is 96% on 2L [place] and states she is coughing but producing no phleghm.  Bilateral wheezes on auscultation.  Day shift nurse made aware; will notify provider and continue to monitor patient. Ciria Bernardini, Joan Mayans, RN

## 2011-10-28 NOTE — Progress Notes (Signed)
CSW covering for the weekend spoke with the pts sister [name] (POA) on the phone and she reports that pt has been living at home alone and the family agrees that she can not return home alone because she has become so confused and they are concerned about her safety. Pts sister states the pt is not agreeable to SNF but no one in the family can offer 24 hours care. Pts sister requested CSW follow up with the pts niece [name] [phone] or pts brother [name] [phone]. [name] is the pts twin and she reports she had a stroke 6 weeks ago so her participation in discharge planning has to be very limited at this time. CSW assigned to the pt will continue to follow and assist with discharge planning to SNF.  Golden Pop 10/28/2011 2:32 PM [phone]

## 2011-10-29 ENCOUNTER — Inpatient Hospital Stay (HOSPITAL_COMMUNITY): Payer: Medicare Other

## 2011-10-29 LAB — BASIC METABOLIC PANEL
BUN: 9 mg/dL (ref 6–23)
Creatinine, Ser: 0.73 mg/dL (ref 0.50–1.10)
GFR calc Af Amer: >90 mL/min (ref >90)
GFR calc non Af Amer: 79 mL/min — ABNORMAL LOW (ref >90)
Potassium: 3.1 mEq/L — ABNORMAL LOW (ref 3.5–5.1)

## 2011-10-29 LAB — CBC
HCT: 34.7 % — ABNORMAL LOW (ref 36.0–46.0)
MCHC: 32.6 g/dL (ref 30.0–36.0)
MCV: 94.8 fL (ref 78.0–100.0)
Platelets: 90 10*3/uL — ABNORMAL LOW (ref 150–400)
RDW: 13.1 % (ref 11.5–15.5)
WBC: 3.9 10*3/uL — ABNORMAL LOW (ref 4.0–10.5)

## 2011-10-29 MED ORDER — POTASSIUM CHLORIDE CRYS ER 20 MEQ PO TBCR
20.0000 meq | EXTENDED_RELEASE_TABLET | Freq: Two times a day (BID) | ORAL | Status: DC
  Administered 2011-10-30 – 2011-11-02 (×7): 20 meq via ORAL
  Filled 2011-10-29 (×8): qty 1

## 2011-10-29 MED ORDER — AMLODIPINE BESYLATE 5 MG PO TABS
5.0000 mg | ORAL_TABLET | Freq: Every day | ORAL | Status: DC
  Administered 2011-10-30 – 2011-11-02 (×4): 5 mg via ORAL
  Filled 2011-10-29 (×4): qty 1

## 2011-10-29 MED ORDER — POTASSIUM CHLORIDE CRYS ER 20 MEQ PO TBCR
40.0000 meq | EXTENDED_RELEASE_TABLET | Freq: Once | ORAL | Status: AC
  Administered 2011-10-29: 40 meq via ORAL
  Filled 2011-10-29: qty 2

## 2011-10-29 NOTE — Progress Notes (Signed)
PATIENT DETAILS Name: [name] Age: 77 y.o. Sex: female Date of Birth: [birth year] Admit Date: 10/27/2011 ZOX:WRUEA,VWUJW M, MD, MD  Subjective: After she felt yesterday, she apparently started having some pain in the left upper abdominal/chest area.  Objective: Vital signs in last 24 hours: Filed Vitals:   10/29/11 0029 10/29/11 0525 10/29/11 0932 10/29/11 1512  BP:  108/64 99/63 124/60  Pulse:  64 65 65  Temp:  98 F (36.7 C)  97.3 F (36.3 C)  TempSrc:  Oral  Oral  Resp:  20  20  Height:      Weight:  76.4 kg (168 lb 6.9 oz)    SpO2: 98% 96%  94%    Weight change: 0.6 kg (1 lb 5.2 oz)  Body mass index is 30.81 kg/(m^2).  Intake/Output from previous day:  Intake/Output Summary (Last 24 hours) at 10/29/11 1622 Last data filed at 10/29/11 1513  Gross per 24 hour  Intake    600 ml  Output      0 ml  Net    600 ml    PHYSICAL EXAM: Gen Exam: Awake and alert with clear speech.   Neck: Supple, No JVD.   Chest: B/L Clear with only some scattered rhonchi. CVS: S1 S2 Regular, no murmurs. Left lower chest that is easily reproducible with palpation. Abdomen: soft, BS +, non tender, non distended.  Extremities: no edema, lower extremities warm to touch. Neurologic: Non Focal.   Skin: No Rash.   Wounds: N/A.    CONSULTS:  none  LAB RESULTS: CBC  Lab 10/29/11 0540 10/28/11 0702 10/27/11 1602 10/27/11 1020  WBC 3.9* 5.1 6.7 8.6  HGB 11.3* 11.6* 11.6* 12.9  HCT 34.7* 35.9* 35.1* 38.1  PLT 90* 98* 112* 121*  MCV 94.8 94.5 93.9 92.5  MCH 30.9 30.5 31.0 31.3  MCHC 32.6 32.3 33.0 33.9  RDW 13.1 13.4 13.3 12.8  LYMPHSABS -- -- -- 1.5  MONOABS -- -- -- 1.0  EOSABS -- -- -- 0.0  BASOSABS -- -- -- 0.0  BANDABS -- -- -- --    Chemistries   Lab 10/29/11 0540 10/28/11 0702 10/27/11 1602 10/27/11 1020  NA 144 141 -- 142  K 3.1* 3.8 -- 3.8  CL 110 108 -- 105  CO2 27 23 -- 27  GLUCOSE 118* 126* -- 129*  BUN 9 11 -- 16  CREATININE 0.73 0.74 0.80 0.81    CALCIUM 8.0* 8.3* -- 9.5  MG -- -- 1.8 --    GFR Estimated Creatinine Clearance: 53.7 ml/min (by C-G formula based on Cr of 0.73).  Coagulation profile No results found for this basename: INR:5,PROTIME:5 in the last 168 hours  Cardiac Enzymes No results found for this basename: CK:3,CKMB:3,TROPONINI:3,MYOGLOBIN:3 in the last 168 hours  No components found with this basename: POCBNP:3 No results found for this basename: DDIMER:2 in the last 72 hours  Basename 10/27/11 1332  HGBA1C 6.6*   No results found for this basename: CHOL:2,HDL:2,LDLCALC:2,TRIG:2,CHOLHDL:2,LDLDIRECT:2 in the last 72 hours No results found for this basename: TSH,T4TOTAL,FREET3,T3FREE,THYROIDAB in the last 72 hours No results found for this basename: VITAMINB12:2,FOLATE:2,FERRITIN:2,TIBC:2,IRON:2,RETICCTPCT:2 in the last 72 hours No results found for this basename: LIPASE:2,AMYLASE:2 in the last 72 hours  Urine Studies No results found for this basename: UACOL:2,UAPR:2,USPG:2,UPH:2,UTP:2,UGL:2,UKET:2,UBIL:2,UHGB:2,UNIT:2,UROB:2,ULEU:2,UEPI:2,UWBC:2,URBC:2,UBAC:2,CAST:2,CRYS:2,UCOM:2,BILUA:2 in the last 72 hours  MICROBIOLOGY: Recent Results (from the past 240 hour(s))  URINE CULTURE     Status: Normal   Collection Time   10/27/11 11:02 AM      Component Value  Range Status Comment   Specimen Description URINE, CLEAN CATCH   Final    Special Requests NONE   Final    Setup Time [id number]   Final    Colony Count 30,000 COLONIES/ML   Final    Culture     Final    Value: Multiple bacterial morphotypes present, none predominant. Suggest appropriate recollection if clinically indicated.   Report Status 10/28/2011 FINAL   Final   URINE CULTURE     Status: Normal   Collection Time   10/27/11  6:36 PM      Component Value Range Status Comment   Specimen Description URINE, CLEAN CATCH   Final    Special Requests NONE   Final    Setup Time [id number]   Final    Colony Count 7,000 COLONIES/ML   Final     Culture INSIGNIFICANT GROWTH   Final    Report Status 10/28/2011 FINAL   Final     RADIOLOGY STUDIES/RESULTS: Dg Chest 2 View  10/27/2011  *RADIOLOGY REPORT*  Clinical Data: Left chest pain, dizziness  CHEST - 2 VIEW  Comparison: 07/19/2011  Findings: Previous CABG.  Vascular clips in the right upper abdomen.  No pneumothorax. Lungs clear.  Heart size and pulmonary vascularity normal.  No effusion.  Visualized bones unremarkable.  IMPRESSION: No acute disease post CABG  Original Report Authenticated By: Osa Craver, M.D.   Dg Elbow Complete Left  10/27/2011  *RADIOLOGY REPORT*  Clinical Data: Bilateral elbow pain.  Trauma.  Fall.  LEFT ELBOW - COMPLETE 3+ VIEW  Comparison: None.  Findings: Anatomic alignment.  No fracture.  Radiocapitellar osteoarthritis is present with small spur of the lateral aspect of the radius.  There is no elbow effusion identified.  IMPRESSION: No displaced fracture or elbow joint effusion.  Mild osteoarthritis.  Original Report Authenticated By: Andreas Newport, M.D.   Dg Elbow Complete Right  10/27/2011  *RADIOLOGY REPORT*  Clinical Data: Fall.  Elbow pain.  RIGHT ELBOW - COMPLETE 3+ VIEW  Comparison: 12/09/2003.  Findings: ORIF of the right elbow is present, little changed from prior examination.  Post-traumatic deformity of the elbow.  No hardware failure is identified.  No elbow effusion.  The radial head appears to have been resected.  IMPRESSION: Post-traumatic changes of the right elbow status post fixation.  No acute osseous abnormality.  Original Report Authenticated By: Andreas Newport, M.D.   Ct Head Wo Contrast  10/27/2011  *RADIOLOGY REPORT*  Clinical Data: Fall.  Head trauma.  CT HEAD WITHOUT CONTRAST  Technique:  Contiguous axial images were obtained from the base of the skull through the vertex without contrast.  Comparison: 07/21/2011.  Findings: No mass lesion, mass effect, midline shift, hydrocephalus, hemorrhage.  No acute territorial cortical  ischemia/infarct. Atrophy and chronic ischemic white matter disease is present.  Study is mildly degraded by motion artifact.  Repeat imaging was performed with some improvement.  Dilated perivascular spaces are present.  Hyperostosis frontalis interna.  No skull fracture is identified.  Scattered punctate parenchymal calcifications are present.  IMPRESSION: Atrophy and chronic ischemic white matter disease without acute intracranial abnormality.  Original Report Authenticated By: Andreas Newport, M.D.    MEDICATIONS: Scheduled Meds:    . amLODipine  5 mg Oral Daily  . bumetanide  0.5 mg Oral Daily  . cefTRIAXone (ROCEPHIN)  IV  1 g Intravenous Q24H  . clopidogrel  75 mg Oral Q breakfast  . enoxaparin  40 mg Subcutaneous Q24H  . insulin aspart  0-15 Units Subcutaneous TID WC  . losartan  50 mg Oral Daily  . metoprolol succinate  25 mg Oral Daily  . pantoprazole  40 mg Oral Q0600  . potassium chloride  40 mEq Oral Once  . rosuvastatin  10 mg Oral Daily  . DISCONTD: amLODipine  5 mg Oral Daily   Continuous Infusions:  PRN Meds:.acetaminophen, acetaminophen, albuterol, alum & mag hydroxide-simeth, clorazepate, ondansetron (ZOFRAN) IV, ondansetron, senna-docusate, traMADol  Antibiotics: Anti-infectives     Start     Dose/Rate Route Frequency Ordered Stop   10/27/11 1400   cefTRIAXone (ROCEPHIN) 1 g in dextrose 5 % 50 mL IVPB  Status:  Discontinued        1 g 100 mL/hr over 30 Minutes Intravenous Every 24 hours 10/27/11 1346 10/27/11 1533   10/27/11 1245   cefTRIAXone (ROCEPHIN) 1 g in dextrose 5 % 50 mL IVPB        1 g 100 mL/hr over 30 Minutes Intravenous Every 24 hours 10/27/11 1232            Assessment/Plan: Patient Active Hospital Problem List: UTI (lower urinary tract infection)  Assessment: Afebrile, nontoxic looking.    Plan: Continue with Rocephin, urine cultures showing multiple bacterial morphotypes. She has already been on antibiotics for the past 2 days since  admission, therefore will not plan repeat in urine cultures as the urine culture will probably be using steroids now. She has been afebrile with Rocephin, will empirically treat for a total of 5-7 days and stop.  Status post fall yesterday Assessment: Was a mechanical fall, now having some mild to moderate left upper abdominal and left lower chest pain. This chest pain is easily reproducible. Suspect that perhaps she may have bruised muscle or ribs. Plan: We'll check a rib series to make sure there is no fracture. We'll continue to monitor and treat symptomatically for now.  Diabetes mellitus    Assessment: CBGs stable.    Plan: Continue with SSI for now, resume Amaryl on discharge.   GERD (gastroesophageal reflux disease   Assessment: Stable    Plan: Continue with PPI.   Hypertension    Assessment: Controlled.    Plan: Continue with amlodipine, metoprolol, Cozaar and Bumex.   Hypokalemia  Assessment: Likely secondary to diuretics  Plan: Will supplement potassium.  Hyperlipidemia    Assessment: Stable, no indication to check lipids this admission.    Plan: Continue with statin.   Fall   Assessment: Possibly from generalized weakness from UTI.   Plan: Await physical therapy evaluation. May need placement on discharge-as the patient lives alone.  Disposition: Remain inpatient-will need SNF on discharge.  DVT Prophylaxis: Lovenox  Code Status: Full code  Maretta Bees, MD. 10/29/2011, 4:22 PM

## 2011-10-29 NOTE — Progress Notes (Signed)
Occupational Therapy Evaluation Patient Details Name: [name] MRN: [id number] DOB: 21-Jun-1929 Today's Date: 10/29/2011  Problem List:  Patient Active Problem List  Diagnoses  . SENILE DEMENTIA WITH DELUSIONAL FEATURES  . ANXIETY  . PERIPHERAL VASCULAR DISEASE  . BRONCHITIS, RECURRENT  . ESOPHAGEAL STRICTURE  . IBS  . NEPHROLITHIASIS  . DEGENERATIVE JOINT DISEASE  . OSTEOPOROSIS  . DYSPHAGIA UNSPECIFIED  . Nonspecific (abnormal) findings on radiological and other examination of body structure  . ABNORMAL CHEST XRAY  . Subclavian arterial stenosis  . Diabetes mellitus  . GERD (gastroesophageal reflux disease)  . Hx of CABG  . Carotid artery disease  . Aspirin intolerance  . CAD (coronary artery disease)  . Hypertension  . Hyperlipidemia  . UTI (lower urinary tract infection)  . Fall    Past Medical History:  Past Medical History  Diagnosis Date  . Bronchitis, mucopurulent recurrent   . Hematoma     Large hematoma from fall, November, 2009, Coumadin stopped  . Pulmonary embolism   . Hypertension   . CAD (coronary artery disease)     2008, question slight anteroapical ischemia  . Subclavian arterial stenosis     PCI  . Hyperlipidemia   . Diabetes mellitus   . GERD (gastroesophageal reflux disease)   . IBS (irritable bowel syndrome)   . Nephrolithiasis   . DJD (degenerative joint disease)   . Right knee injury   . Osteoporosis   . Senile dementia with delusional features   . Anxiety   . Palpitations   . Hiatal hernia   . Hemorrhoids   . Obesity   . SOB (shortness of breath)   . Esophageal stricture   . Ejection fraction     EF 70%, nuclear, 2007  . Hx of CABG     2003, ... LIMA not used... small caliber  . Carotid artery disease     Doppler, December, 2007, bilateral 0-39%  . Aspirin intolerance   . Dementia     Senile dementia with delusional features  . Warfarin anticoagulation     stopped 09/2008 with large hematoma from fall   Past  Surgical History:  Past Surgical History  Procedure Date  . Appendectomy   . Cataract extraction   . Hemorrhoid surgery   . Abdominal hysterectomy   . Cabg  1 vessel w/svg to lad 2003  . Mult orthopedic procedures     Dr. Arnette Norris arm, CTS,cyst removal  . Open heart surgery     OT Assessment/Plan/Recommendation OT Assessment Clinical Impression Statement: This 77 y.o. female presents to OT with generalized weakness, decreased balance, decreased cognition including decreased safety awareness, and decreased problem solving for new situations.  Per family, pt. performing poorly in home environment.  Pt. will need 24 hour supervision/assist at D/C.   Recommend SNF for rehab.  Pt. agreeable to this, but states she's not going "to one of those rest homes.  That's for old people" OT Recommendation/Assessment: Patient will need skilled OT in the acute care venue OT Problem List: Decreased strength;Decreased activity tolerance;Impaired balance (sitting and/or standing);Decreased cognition;Decreased safety awareness;Decreased knowledge of use of DME or AE Barriers to Discharge: Decreased caregiver support OT Therapy Diagnosis : Generalized weakness;Cognitive deficits OT Plan OT Frequency: Min 1X/week OT Treatment/Interventions: Self-care/ADL training;DME and/or AE instruction;Therapeutic activities;Patient/family education;Balance training OT Recommendation Follow Up Recommendations: Skilled nursing facility Equipment Recommended: Defer to next venue Individuals Consulted Consulted and Agree with Results and Recommendations: Patient OT Goals Acute Rehab OT Goals OT Goal Formulation:  With patient Time For Goal Achievement: 2 weeks ADL Goals Pt Will Perform Grooming: with supervision;Standing at sink ADL Goal: Grooming - Progress: Not met Pt Will Perform Upper Body Bathing: with set-up ADL Goal: Upper Body Bathing - Progress: Not met Pt Will Perform Lower Body Bathing: with  supervision;Sit to stand from chair ADL Goal: Lower Body Bathing - Progress: Not met Pt Will Transfer to Toilet: with supervision;Comfort height toilet;Ambulation ADL Goal: Toilet Transfer - Progress: Not met Pt Will Perform Toileting - Clothing Manipulation: with supervision;Standing ADL Goal: Toileting - Clothing Manipulation - Progress: Not met  OT Evaluation Precautions/Restrictions  Precautions Precautions: Fall Restrictions Weight Bearing Restrictions: No Prior Functioning Home Living Lives With: Alone Receives Help From: Family (Brother can no longer provide assistance) Type of Home: House Additional Comments: Pt. for SNF placement Prior Function Comments: Pt. reports she reports she is fully independent, however, brother reports that pt. requires frequent assitance for daily acitivities.  He reports that pt. is unable to manage medications, and has had frequent falls ADL ADL Eating/Feeding: Simulated;Independent Where Assessed - Eating/Feeding: Chair Grooming: Performed;Wash/dry hands;Wash/dry face;Teeth care;Brushing hair;Minimal assistance (min guard assist for standing at sink) Where Assessed - Grooming: Standing at sink Upper Body Bathing: Simulated;Supervision/safety Where Assessed - Upper Body Bathing: Unsupported;Sitting, chair Lower Body Bathing: Simulated;Minimal assistance (min guard assist) Where Assessed - Lower Body Bathing: Sit to stand from chair Upper Body Dressing: Simulated;Supervision/safety Where Assessed - Upper Body Dressing: Unsupported;Sitting, chair Lower Body Dressing: Simulated;Performed;Minimal assistance (min guard assist) Where Assessed - Lower Body Dressing: Sit to stand from chair Toilet Transfer: Simulated;Minimal assistance (assist to safely maneuver RW) Toilet Transfer Method: Ambulating Toilet Transfer Equipment: Bedside commode Toileting - Clothing Manipulation: Simulated;Minimal assistance (min guard assist) Where Assessed -  Glass blower/designer Manipulation: Standing Toileting - Hygiene: Simulated;Supervision/safety Where Assessed - Toileting Hygiene: Sit on 3-in-1 or toilet ADL Comments: Pt. frequently becomes distracted and requires min verbal cues to re-direct to activity she is performing.  Pt. adamantly declares that she is independent at home, and that brother can assist her.  Brother states pt. frequently hallucinating, and requires significant level of assistance due to cognitive deficts.  RN reports pt. slid off of chair yesterday  Vision/Perception  Vision - Assessment Vision Assessment: Vision not tested Cognition Cognition Arousal/Alertness: Awake/alert Overall Cognitive Status: History of cognitive impairments History of Cognitive Impairment: Appears at baseline functioning Orientation Level: Oriented to person;Oriented to place;Oriented to situation Cognition - Other Comments: Pt. with decreased awareness of deficts and impact on function Sensation/Coordination Coordination Gross Motor Movements are Fluid and Coordinated: Yes Fine Motor Movements are Fluid and Coordinated: Yes Extremity Assessment RUE Assessment RUE Assessment: Within Functional Limits LUE Assessment LUE Assessment: Within Functional Limits Mobility  Pt. Required min A for sit to stand, and min A to maneuver walker when ambulating to sink End of Session OT - End of Session Equipment Utilized During Treatment:  (RW) Activity Tolerance: Patient limited by fatigue (Pt. did c/o low back pain at end of session) Patient left: in chair;with call bell in reach;with family/visitor present General Behavior During Session: [facility] for tasks performed Cognition: Impaired, at baseline   Jamielynn Wigley M 10/29/2011, 1:16 PM

## 2011-10-29 NOTE — Progress Notes (Signed)
Physical Therapy Treatment Patient Details Name: [name] MRN: [id number] DOB: 05-26-1929 Today's Date: 10/29/2011  PT Assessment/Plan  PT - Assessment/Plan Comments on Treatment Session: Pt progressing with therapy. Still requiring A with all aspects mobility.  PT Plan: Discharge plan remains appropriate PT Frequency: Min 2X/week Follow Up Recommendations: Skilled nursing facility Equipment Recommended: Defer to next venue PT Goals  Acute Rehab PT Goals PT Goal: Supine/Side to Sit - Progress: Progressing toward goal PT Goal: Sit to Stand - Progress: Progressing toward goal PT Goal: Stand to Sit - Progress: Progressing toward goal PT Goal: Ambulate - Progress: Progressing toward goal  PT Treatment Precautions/Restrictions  Precautions Precautions: Fall Restrictions Weight Bearing Restrictions: No Mobility (including Balance) Bed Mobility Supine to Sit: 4: Min assist;HOB elevated (Comment degrees) (30%) Supine to Sit Details (indicate cue type and reason): A for LE positioning and for shoulders. Cues for safe technique Transfers Sit to Stand: 4: Min assist;From bed;From chair/3-in-1;With upper extremity assist Sit to Stand Details (indicate cue type and reason): x3. Cues for safe hand placement Stand to Sit: 4: Min assist;To chair/3-in-1 Stand to Sit Details: x3. Cues to sit slowly Ambulation/Gait Ambulation/Gait: Yes Ambulation/Gait Assistance: 4: Min assist Ambulation/Gait Assistance Details (indicate cue type and reason): Cues for positioning of RW. Cues to avoid obstacles in hallway.  Ambulation Distance (Feet): 200 Feet Assistive device: Rolling walker Gait Pattern: Step-to pattern;Decreased stride length    Exercise  General Exercises - Lower Extremity Long Arc Quad: AROM;20 reps;Both Hip Flexion/Marching: AROM;Both;Other reps (comment) (20) End of Session PT - End of Session Equipment Utilized During Treatment: Gait belt Activity Tolerance: Patient  tolerated treatment well Patient left: in chair;with call bell in reach;with bed alarm set Nurse Communication: Mobility status for transfers;Mobility status for ambulation General Behavior During Session: [facility] for tasks performed Cognition: [facility] for tasks performed  [name], [name] Potter 10/29/2011, 12:41 PM 10/29/2011 [name] PTA [phone] pager [phone] office

## 2011-10-30 LAB — BASIC METABOLIC PANEL
BUN: 7 mg/dL (ref 6–23)
CO2: 27 mEq/L (ref 19–32)
Calcium: 8.2 mg/dL — ABNORMAL LOW (ref 8.4–10.5)
Chloride: 109 mEq/L (ref 96–112)
Creatinine, Ser: 0.7 mg/dL (ref 0.50–1.10)

## 2011-10-30 LAB — GLUCOSE, CAPILLARY
Glucose-Capillary: 123 mg/dL — ABNORMAL HIGH (ref 70–99)
Glucose-Capillary: 133 mg/dL — ABNORMAL HIGH (ref 70–99)
Glucose-Capillary: 137 mg/dL — ABNORMAL HIGH (ref 70–99)

## 2011-10-30 MED ORDER — WHITE PETROLATUM GEL
Status: AC
  Administered 2011-10-30: 13:00:00
  Filled 2011-10-30: qty 5

## 2011-10-30 NOTE — Progress Notes (Signed)
PATIENT DETAILS Name: [name] Age: 77 y.o. Sex: female Date of Birth: 12-12-1928 Admit Date: 10/27/2011 EAV:WUJWJ,XBJYN M, MD, MD  Subjective: No major events overnight. Pain in his left upper abdomen and left lower chest area but better-but still "sore".  Objective: Vital signs in last 24 hours: Filed Vitals:   10/29/11 0932 10/29/11 1512 10/29/11 2115 10/30/11 0504  BP: 99/63 124/60 131/71 127/69  Pulse: 65 65 70 75  Temp:  97.3 F (36.3 C) 99 F (37.2 C) 98.1 F (36.7 C)  TempSrc:  Oral  Oral  Resp:  20 19 20   Height:      Weight:    73.1 kg (161 lb 2.5 oz)  SpO2:  94% 93% 98%    Weight change: -3.3 kg (-7 lb 4.4 oz)  Body mass index is 29.48 kg/(m^2).  Intake/Output from previous day:  Intake/Output Summary (Last 24 hours) at 10/30/11 1143 Last data filed at 10/30/11 0830  Gross per 24 hour  Intake    840 ml  Output      0 ml  Net    840 ml    PHYSICAL EXAM: Gen Exam: Awake and alert with clear speech.   Neck: Supple, No JVD.   Chest: B/L Clear with only some scattered rhonchi. CVS: S1 S2 Regular, no murmurs. Left lower chest that is easily reproducible with palpation. Abdomen: soft, BS +, non tender, non distended.  Extremities: no edema, lower extremities warm to touch. Neurologic: Non Focal.   Skin: No Rash.   Wounds: N/A.    CONSULTS:  none  LAB RESULTS: CBC  Lab 10/29/11 0540 10/28/11 0702 10/27/11 1602 10/27/11 1020  WBC 3.9* 5.1 6.7 8.6  HGB 11.3* 11.6* 11.6* 12.9  HCT 34.7* 35.9* 35.1* 38.1  PLT 90* 98* 112* 121*  MCV 94.8 94.5 93.9 92.5  MCH 30.9 30.5 31.0 31.3  MCHC 32.6 32.3 33.0 33.9  RDW 13.1 13.4 13.3 12.8  LYMPHSABS -- -- -- 1.5  MONOABS -- -- -- 1.0  EOSABS -- -- -- 0.0  BASOSABS -- -- -- 0.0  BANDABS -- -- -- --    Chemistries   Lab 10/30/11 0503 10/29/11 0540 10/28/11 0702 10/27/11 1602 10/27/11 1020  NA 144 144 141 -- 142  K 3.6 3.1* 3.8 -- 3.8  CL 109 110 108 -- 105  CO2 27 27 23  -- 27  GLUCOSE 125* 118*  126* -- 129*  BUN 7 9 11  -- 16  CREATININE 0.70 0.73 0.74 0.80 0.81  CALCIUM 8.2* 8.0* 8.3* -- 9.5  MG -- -- -- 1.8 --    GFR Estimated Creatinine Clearance: 52.5 ml/min (by C-G formula based on Cr of 0.7).  Coagulation profile No results found for this basename: INR:5,PROTIME:5 in the last 168 hours  Cardiac Enzymes No results found for this basename: CK:3,CKMB:3,TROPONINI:3,MYOGLOBIN:3 in the last 168 hours  No components found with this basename: POCBNP:3 No results found for this basename: DDIMER:2 in the last 72 hours  Basename 10/27/11 1332  HGBA1C 6.6*   No results found for this basename: CHOL:2,HDL:2,LDLCALC:2,TRIG:2,CHOLHDL:2,LDLDIRECT:2 in the last 72 hours No results found for this basename: TSH,T4TOTAL,FREET3,T3FREE,THYROIDAB in the last 72 hours No results found for this basename: VITAMINB12:2,FOLATE:2,FERRITIN:2,TIBC:2,IRON:2,RETICCTPCT:2 in the last 72 hours No results found for this basename: LIPASE:2,AMYLASE:2 in the last 72 hours  Urine Studies No results found for this basename: UACOL:2,UAPR:2,USPG:2,UPH:2,UTP:2,UGL:2,UKET:2,UBIL:2,UHGB:2,UNIT:2,UROB:2,ULEU:2,UEPI:2,UWBC:2,URBC:2,UBAC:2,CAST:2,CRYS:2,UCOM:2,BILUA:2 in the last 72 hours  MICROBIOLOGY: Recent Results (from the past 240 hour(s))  URINE CULTURE     Status: Normal  Collection Time   10/27/11 11:02 AM      Component Value Range Status Comment   Specimen Description URINE, CLEAN CATCH   Final    Special Requests NONE   Final    Setup Time [id number]   Final    Colony Count 30,000 COLONIES/ML   Final    Culture     Final    Value: Multiple bacterial morphotypes present, none predominant. Suggest appropriate recollection if clinically indicated.   Report Status 10/28/2011 FINAL   Final   URINE CULTURE     Status: Normal   Collection Time   10/27/11  6:36 PM      Component Value Range Status Comment   Specimen Description URINE, CLEAN CATCH   Final    Special Requests NONE   Final     Setup Time [id number]   Final    Colony Count 7,000 COLONIES/ML   Final    Culture INSIGNIFICANT GROWTH   Final    Report Status 10/28/2011 FINAL   Final     RADIOLOGY STUDIES/RESULTS: Dg Chest 2 View  10/27/2011  *RADIOLOGY REPORT*  Clinical Data: Left chest pain, dizziness  CHEST - 2 VIEW  Comparison: 07/19/2011  Findings: Previous CABG.  Vascular clips in the right upper abdomen.  No pneumothorax. Lungs clear.  Heart size and pulmonary vascularity normal.  No effusion.  Visualized bones unremarkable.  IMPRESSION: No acute disease post CABG  Original Report Authenticated By: Osa Craver, M.D.   Dg Elbow Complete Left  10/27/2011  *RADIOLOGY REPORT*  Clinical Data: Bilateral elbow pain.  Trauma.  Fall.  LEFT ELBOW - COMPLETE 3+ VIEW  Comparison: None.  Findings: Anatomic alignment.  No fracture.  Radiocapitellar osteoarthritis is present with small spur of the lateral aspect of the radius.  There is no elbow effusion identified.  IMPRESSION: No displaced fracture or elbow joint effusion.  Mild osteoarthritis.  Original Report Authenticated By: Andreas Newport, M.D.   Dg Elbow Complete Right  10/27/2011  *RADIOLOGY REPORT*  Clinical Data: Fall.  Elbow pain.  RIGHT ELBOW - COMPLETE 3+ VIEW  Comparison: 12/09/2003.  Findings: ORIF of the right elbow is present, little changed from prior examination.  Post-traumatic deformity of the elbow.  No hardware failure is identified.  No elbow effusion.  The radial head appears to have been resected.  IMPRESSION: Post-traumatic changes of the right elbow status post fixation.  No acute osseous abnormality.  Original Report Authenticated By: Andreas Newport, M.D.   Ct Head Wo Contrast  10/27/2011  *RADIOLOGY REPORT*  Clinical Data: Fall.  Head trauma.  CT HEAD WITHOUT CONTRAST  Technique:  Contiguous axial images were obtained from the base of the skull through the vertex without contrast.  Comparison: 07/21/2011.  Findings: No mass lesion, mass  effect, midline shift, hydrocephalus, hemorrhage.  No acute territorial cortical ischemia/infarct. Atrophy and chronic ischemic white matter disease is present.  Study is mildly degraded by motion artifact.  Repeat imaging was performed with some improvement.  Dilated perivascular spaces are present.  Hyperostosis frontalis interna.  No skull fracture is identified.  Scattered punctate parenchymal calcifications are present.  IMPRESSION: Atrophy and chronic ischemic white matter disease without acute intracranial abnormality.  Original Report Authenticated By: Andreas Newport, M.D.    MEDICATIONS: Scheduled Meds:    . amLODipine  5 mg Oral Daily  . bumetanide  0.5 mg Oral Daily  . cefTRIAXone (ROCEPHIN)  IV  1 g Intravenous Q24H  . clopidogrel  75 mg Oral  Q breakfast  . enoxaparin  40 mg Subcutaneous Q24H  . insulin aspart  0-15 Units Subcutaneous TID WC  . losartan  50 mg Oral Daily  . metoprolol succinate  25 mg Oral Daily  . pantoprazole  40 mg Oral Q0600  . potassium chloride  20 mEq Oral BID  . potassium chloride  40 mEq Oral Once  . rosuvastatin  10 mg Oral Daily   Continuous Infusions:  PRN Meds:.acetaminophen, acetaminophen, albuterol, alum & mag hydroxide-simeth, clorazepate, ondansetron (ZOFRAN) IV, ondansetron, senna-docusate, traMADol  Antibiotics: Anti-infectives     Start     Dose/Rate Route Frequency Ordered Stop   10/27/11 1400   cefTRIAXone (ROCEPHIN) 1 g in dextrose 5 % 50 mL IVPB  Status:  Discontinued        1 g 100 mL/hr over 30 Minutes Intravenous Every 24 hours 10/27/11 1346 10/27/11 1533   10/27/11 1245   cefTRIAXone (ROCEPHIN) 1 g in dextrose 5 % 50 mL IVPB        1 g 100 mL/hr over 30 Minutes Intravenous Every 24 hours 10/27/11 1232            Assessment/Plan: Patient Active Hospital Problem List: UTI (lower urinary tract infection)  Assessment: Afebrile, nontoxic looking.    Plan: Continue with Rocephin, urine cultures showing multiple bacterial  morphotypes. She has already been on antibiotics for the past 2 days since admission, therefore will not plan repeat in urine cultures as the urine culture will probably be using steroids now. She has been afebrile with Rocephin, will empirically treat for a total of 5-7 days and stop.  Status post fall yesterday Assessment: Was a mechanical fall, now having some mild to moderate left upper abdominal and left lower chest pain. This chest pain is easily reproducible. Suspect that perhaps she may have bruised muscle or ribs. Plan: Rib series did not show any fracture. Pain getting better today.  Diabetes mellitus    Assessment: CBGs stable.    Plan: Continue with SSI for now, resume Amaryl on discharge.   GERD (gastroesophageal reflux disease   Assessment: Stable    Plan: Continue with PPI.   Hypertension    Assessment: Controlled.    Plan: Continue with amlodipine, metoprolol, Cozaar and Bumex.   Hypokalemia  Assessment: Likely secondary to diuretics  Plan: Better, now on KCl 20 mEq twice daily. Need to check electrolytes periodically.  Hyperlipidemia    Assessment: Stable, no indication to check lipids this admission.    Plan: Continue with statin.   Fall   Assessment: Possibly from generalized weakness from UTI.   Plan: Await physical therapy evaluation. May need placement on discharge-as the patient lives alone.  Disposition: Remain inpatient-will need SNF on discharge.  DVT Prophylaxis: Lovenox  Code Status: Full code  Maretta Bees, MD. 10/30/2011, 11:43 AM

## 2011-10-30 NOTE — Progress Notes (Signed)
Pt has to be reminded constantly to call out for assistance and to not get up without a staff member present but continues to set the bed alarm off attempting to get up. Pt is aware of time and oriented to self but is disoriented to place and situation requiring constant reorientation.

## 2011-10-31 LAB — BASIC METABOLIC PANEL
CO2: 28 mEq/L (ref 19–32)
Chloride: 108 mEq/L (ref 96–112)
Glucose, Bld: 132 mg/dL — ABNORMAL HIGH (ref 70–99)
Potassium: 4 mEq/L (ref 3.5–5.1)
Sodium: 142 mEq/L (ref 135–145)

## 2011-10-31 LAB — GLUCOSE, CAPILLARY
Glucose-Capillary: 124 mg/dL — ABNORMAL HIGH (ref 70–99)
Glucose-Capillary: 162 mg/dL — ABNORMAL HIGH (ref 70–99)
Glucose-Capillary: 151 mg/dL — ABNORMAL HIGH (ref 70–99)

## 2011-10-31 NOTE — Progress Notes (Signed)
PATIENT DETAILS Name: [name] Age: 77 y.o. Sex: female Date of Birth: 26-Jun-1929 Admit Date: 10/27/2011 [id number] M, MD, MD  Subjective:  Denies chest pain shortness of breath.  Objective: Vital signs in last 24 hours: Filed Vitals:   10/30/11 0504 10/30/11 1500 10/30/11 1950 10/31/11 0506  BP: 127/69 103/69 120/77 147/69  Pulse: 75 74 72 66  Temp: 98.1 F (36.7 C) 97.7 F (36.5 C) 99 F (37.2 C) 98.8 F (37.1 C)  TempSrc: Oral Oral Oral Oral  Resp: 20 20 18 20   Height:      Weight: 73.1 kg (161 lb 2.5 oz)   74.7 kg (164 lb 10.9 oz)  SpO2: 98% 97% 97% 98%    Weight change: 1.6 kg (3 lb 8.4 oz)  Body mass index is 30.12 kg/(m^2).  Intake/Output from previous day:  Intake/Output Summary (Last 24 hours) at 10/31/11 1205 Last data filed at 10/31/11 0900  Gross per 24 hour  Intake    480 ml  Output      0 ml  Net    480 ml    PHYSICAL EXAM: Gen Exam: Awake and alert with clear speech.   Neck: Supple, No JVD.   Chest: B/L Clear with only some scattered rhonchi. CVS: S1 S2 Regular, no murmurs. Left lower chest that is easily reproducible with palpation. Abdomen: soft, BS +, non tender, non distended.  Extremities: no edema, lower extremities warm to touch. Neurologic: Non Focal.   Skin: No Rash.   Wounds: N/A.    CONSULTS:  none  LAB RESULTS: CBC  Lab 10/29/11 0540 10/28/11 0702 10/27/11 1602 10/27/11 1020  WBC 3.9* 5.1 6.7 8.6  HGB 11.3* 11.6* 11.6* 12.9  HCT 34.7* 35.9* 35.1* 38.1  PLT 90* 98* 112* 121*  MCV 94.8 94.5 93.9 92.5  MCH 30.9 30.5 31.0 31.3  MCHC 32.6 32.3 33.0 33.9  RDW 13.1 13.4 13.3 12.8  LYMPHSABS -- -- -- 1.5  MONOABS -- -- -- 1.0  EOSABS -- -- -- 0.0  BASOSABS -- -- -- 0.0  BANDABS -- -- -- --    Chemistries   Lab 10/31/11 0600 10/30/11 0503 10/29/11 0540 10/28/11 0702 10/27/11 1602 10/27/11 1020  NA 142 144 144 141 -- 142  K 4.0 3.6 3.1* 3.8 -- 3.8  CL 108 109 110 108 -- 105  CO2 28 27 27 23  -- 27  GLUCOSE  132* 125* 118* 126* -- 129*  BUN 8 7 9 11  -- 16  CREATININE 0.64 0.70 0.73 0.74 0.80 --  CALCIUM 8.7 8.2* 8.0* 8.3* -- 9.5  MG -- -- -- -- 1.8 --    RADIOLOGY STUDIES/RESULTS: Dg Chest 2 View  10/27/2011  *RADIOLOGY REPORT*  Clinical Data: Left chest pain, dizziness  CHEST - 2 VIEW  Comparison: 07/19/2011  Findings: Previous CABG.  Vascular clips in the right upper abdomen.  No pneumothorax. Lungs clear.  Heart size and pulmonary vascularity normal.  No effusion.  Visualized bones unremarkable.  IMPRESSION: No acute disease post CABG  Original Report Authenticated By: Osa Craver, M.D.   Dg Elbow Complete Left  10/27/2011  *RADIOLOGY REPORT*  Clinical Data: Bilateral elbow pain.  Trauma.  Fall.  LEFT ELBOW - COMPLETE 3+ VIEW  Comparison: None.  Findings: Anatomic alignment.  No fracture.  Radiocapitellar osteoarthritis is present with small spur of the lateral aspect of the radius.  There is no elbow effusion identified.  IMPRESSION: No displaced fracture or elbow joint effusion.  Mild osteoarthritis.  Original Report Authenticated By: Andreas Newport, M.D.   Dg Elbow Complete Right  10/27/2011  *RADIOLOGY REPORT*  Clinical Data: Fall.  Elbow pain.  RIGHT ELBOW - COMPLETE 3+ VIEW  Comparison: 12/09/2003.  Findings: ORIF of the right elbow is present, little changed from prior examination.  Post-traumatic deformity of the elbow.  No hardware failure is identified.  No elbow effusion.  The radial head appears to have been resected.  IMPRESSION: Post-traumatic changes of the right elbow status post fixation.  No acute osseous abnormality.  Original Report Authenticated By: Andreas Newport, M.D.   Ct Head Wo Contrast  10/27/2011  *RADIOLOGY REPORT*  Clinical Data: Fall.  Head trauma.  CT HEAD WITHOUT CONTRAST  Technique:  Contiguous axial images were obtained from the base of the skull through the vertex without contrast.  Comparison: 07/21/2011.  Findings: No mass lesion, mass effect,  midline shift, hydrocephalus, hemorrhage.  No acute territorial cortical ischemia/infarct. Atrophy and chronic ischemic white matter disease is present.  Study is mildly degraded by motion artifact.  Repeat imaging was performed with some improvement.  Dilated perivascular spaces are present.  Hyperostosis frontalis interna.  No skull fracture is identified.  Scattered punctate parenchymal calcifications are present.  IMPRESSION: Atrophy and chronic ischemic white matter disease without acute intracranial abnormality.  Original Report Authenticated By: Andreas Newport, M.D.    MEDICATIONS: Scheduled Meds:    . amLODipine  5 mg Oral Daily  . bumetanide  0.5 mg Oral Daily  . cefTRIAXone (ROCEPHIN)  IV  1 g Intravenous Q24H  . clopidogrel  75 mg Oral Q breakfast  . enoxaparin  40 mg Subcutaneous Q24H  . insulin aspart  0-15 Units Subcutaneous TID WC  . losartan  50 mg Oral Daily  . metoprolol succinate  25 mg Oral Daily  . pantoprazole  40 mg Oral Q0600  . potassium chloride  20 mEq Oral BID  . rosuvastatin  10 mg Oral Daily  . white petrolatum       Continuous Infusions:  PRN Meds:.acetaminophen, acetaminophen, albuterol, alum & mag hydroxide-simeth, clorazepate, ondansetron (ZOFRAN) IV, ondansetron, senna-docusate, traMADol  Antibiotics: Anti-infectives     Start     Dose/Rate Route Frequency Ordered Stop   10/27/11 1400   cefTRIAXone (ROCEPHIN) 1 g in dextrose 5 % 50 mL IVPB  Status:  Discontinued        1 g 100 mL/hr over 30 Minutes Intravenous Every 24 hours 10/27/11 1346 10/27/11 1533   10/27/11 1245   cefTRIAXone (ROCEPHIN) 1 g in dextrose 5 % 50 mL IVPB        1 g 100 mL/hr over 30 Minutes Intravenous Every 24 hours 10/27/11 1232            Assessment/Plan: Patient Active Hospital Problem List:.  UTI (lower urinary tract infection)  Assessment: Afebrile, nontoxic looking.    Plan: Continue with Rocephin, urine cultures showing multiple bacterial morphotypes. She has  already been on antibiotics for the past 2 days since admission, therefore will not plan repeat in urine cultures as the urine culture will probably be using steroids now. She has been afebrile with Rocephin, will empirically treat for a total of 5-7 days and stop.  Status post fall yesterday Assessment: Was a mechanical fall, now having some mild to moderate left upper abdominal and left lower chest pain. This chest pain is easily reproducible. Suspect that perhaps she may have bruised muscle or ribs. Plan: Rib series did not show any fracture. Pain getting better  today.  Diabetes mellitus    Assessment: CBGs stable.    Plan: Continue with SSI for now, resume Amaryl on discharge.   GERD (gastroesophageal reflux disease   Assessment: Stable    Plan: Continue with PPI.   Hypertension    Assessment: Controlled.    Plan: Continue with amlodipine, metoprolol, Cozaar and Bumex.   Hypokalemia  Assessment: Likely secondary to diuretics  Plan: Better, now on KCl 20 mEq twice daily. Need to check electrolytes periodically.  Hyperlipidemia    Assessment: Stable, no indication to check lipids this admission.    Plan: Continue with statin.   Fall   Assessment: Possibly from generalized weakness from UTI.   Plan: Await physical therapy evaluation. May need placement on discharge-as the patient lives alone.  Disposition: Patient SNF. Patient declined and she wants to go home at the time of discharge. For discharge home in the morning  DVT Prophylaxis: Lovenox  Code Status: Full code  Lothar Prehn A 10/31/2011, 12:05 PM

## 2011-11-01 ENCOUNTER — Inpatient Hospital Stay (HOSPITAL_COMMUNITY): Payer: Medicare Other

## 2011-11-01 DIAGNOSIS — F0390 Unspecified dementia without behavioral disturbance: Secondary | ICD-10-CM

## 2011-11-01 LAB — GLUCOSE, CAPILLARY: Glucose-Capillary: 137 mg/dL — ABNORMAL HIGH (ref 70–99)

## 2011-11-01 MED ORDER — ACETAMINOPHEN 325 MG PO TABS: 650.0000 mg | ORAL_TABLET | Freq: Four times a day (QID) | ORAL | Status: AC | PRN

## 2011-11-01 MED ORDER — QUETIAPINE 12.5 MG HALF TABLET
12.5000 mg | ORAL_TABLET | Freq: Every day | ORAL | Status: DC
  Administered 2011-11-01: 12.5 mg via ORAL
  Filled 2011-11-01 (×2): qty 1

## 2011-11-01 MED ORDER — QUETIAPINE 12.5 MG HALF TABLET: 12.5000 mg | ORAL_TABLET | Freq: Every day | ORAL | Status: DC

## 2011-11-01 NOTE — Progress Notes (Signed)
   CARE MANAGEMENT NOTE 11/01/2011  Patient:  [name], [name]   Account Number:  [id number]  Date Initiated:  11/01/2011  Documentation initiated by:  Donn Pierini  Subjective/Objective Assessment:   Pt admitted with UTI, falls at home     Action/Plan:   PTA pt lived at home alone, was independent with ADLs, hx of falls at home, PT/OT evals   Anticipated DC Date:  11/02/2011   Anticipated DC Plan:  ASSISTED LIVING / REST HOME  In-house referral  Clinical Social Worker      DC Associate Professor  CM consult      PAC Choice  DURABLE MEDICAL EQUIPMENT  HOME HEALTH   Choice offered to / List presented to:  C-1 Patient      DME agency  [facility]        [facility] agency  [facility]   Status of service:  In process, will continue to follow Medicare Important Message given?   (If response is "NO", the following Medicare IM given date fields will be blank) Date Medicare IM given:   Date Additional Medicare IM given:    Discharge Disposition:    Per UR Regulation:    Comments:  PCP- Nadel  11/01/11- 1145- Donn Pierini RN, BSN [phone] Pt for possible d/c today, spoke with pt at bedside regarding d/c plans. Pt states that she lives at home alone. Per PT/OT notes recommending SNF but pt refusing to go to SNF for rehab. Pt wants to go home. Agreeable to [facility] services. List of agencies for [facility] given to pt. Pt wants to use [facility] for [facility] services. Pt also states that she needs a RW for home. Pt reports that she has tried to call brother but has not gotten in touch with him- he would be her ride home. Pt says that she does not have any other way home as no one else has a key to her home. Called and got in touch with pt's brother Vernia Buff ([phone]) who per conversation is working on placing pt in [facility]. Representative from [facility] was there speaking with brother and stated that someone from [facility] would be coming  to see pt later today. Contact from [facility] is [place] cell [phone] (fax [phone]) Will have CSW start on FL2 and consult for possible ALF placement. CM to continue to follow for d/c needs

## 2011-11-01 NOTE — Progress Notes (Signed)
Noted, will follow up on the placement.  [name] A  11/01/2011, 2:57 PM

## 2011-11-01 NOTE — Progress Notes (Signed)
Patient ID: [name], female   DOB: October 26, 1929, 77 y.o.   MRN: [id number] Spoke with brother Vernia Buff and sister Cammie Mcgee Loftis who reports:   1.  Patient was diagnosed with Alzheimer's 5 years ago.  Dr. Kriste Basque recommended ALF 3 years ago.   The patient did not go into ALF then as the brother said he could care for her.  Now he can no longer care for her (he's 83 and can't help lift her) 2.  The patient recently took her sister off of her bank accounts and has been bouncing many checks. 3.  Patient cant remember what was said from one conversation to the next. 4.  Family strongly agrees patient is unsafe to live in her own home alone.  They are trying to arrange care at [facility].

## 2011-11-01 NOTE — Progress Notes (Signed)
Utilization review completed.  

## 2011-11-01 NOTE — Progress Notes (Addendum)
Patient ID: [name], female   DOB: Mar 17, 1929, 77 y.o.   MRN: [id number]  PATIENT DETAILS Name: [name] Age: 77 y.o. Sex: female Date of Birth: August 12, 1929 Admit Date: 10/27/2011 [id number],[id number] M, MD, MD  Subjective:  Complaining of pain under left rib cage (not new).  Patient reports her brother is at her house all the time and can take care of her.  In the same conversation she reports that her brother changed his number and that she does not have it any longer and cannot reach him . She also says that he has never had as he is continually running around with women since the death of his wife. The patient adamantly refuses skilled nursing facility placement. In an attempt to compromise she reluctantly agreed to home health services.  Case management reports that the patient's brother is working to obtain assisted living facility placement.  Thus far the patient refuses ALF placement stating that she does not want to lose her home.  Objective: Vital signs in last 24 hours: Filed Vitals:   10/31/11 1400 10/31/11 2105 11/01/11 0500 11/01/11 0621  BP: 131/61 108/64  110/63  Pulse: 71 75  76  Temp: 97.5 F (36.4 C) 98 F (36.7 C)  98.4 F (36.9 C)  TempSrc: Oral Oral  Oral  Resp: 20 20  20   Height:      Weight:   74 kg (163 lb 2.3 oz)   SpO2: 94% 98%      Weight change: -0.7 kg (-1 lb 8.7 oz)  Body mass index is 29.84 kg/(m^2).  Intake/Output from previous day:  Intake/Output Summary (Last 24 hours) at 11/01/11 1109 Last data filed at 11/01/11 0908  Gross per 24 hour  Intake    840 ml  Output      0 ml  Net    840 ml    PHYSICAL EXAM: Gen Exam: Awake and alert with clear speech.  Patient contradicts herself with regard to family support at home. Neck: Supple, No JVD.   Chest: Forced expiratory wheeze, some scattered rhonchi. CVS: S1 S2 Regular, no murmurs. Left lower chest/rib pain that is easily reproducible with palpation. Abdomen: soft, BS +, non tender,  non distended.  Extremities: no edema, lower extremities warm to touch. Neurologic: Non Focal.   Skin: No Rash.   Wounds: bandage on left elbow from previous fall.   LAB RESULTS: CBC  Lab 10/29/11 0540 10/28/11 0702 10/27/11 1602 10/27/11 1020  WBC 3.9* 5.1 6.7 8.6  HGB 11.3* 11.6* 11.6* 12.9  HCT 34.7* 35.9* 35.1* 38.1  PLT 90* 98* 112* 121*  MCV 94.8 94.5 93.9 92.5  MCH 30.9 30.5 31.0 31.3  MCHC 32.6 32.3 33.0 33.9  RDW 13.1 13.4 13.3 12.8  LYMPHSABS -- -- -- 1.5  MONOABS -- -- -- 1.0  EOSABS -- -- -- 0.0  BASOSABS -- -- -- 0.0  BANDABS -- -- -- --    Chemistries   Lab 10/31/11 0600 10/30/11 0503 10/29/11 0540 10/28/11 0702 10/27/11 1602 10/27/11 1020  NA 142 144 144 141 -- 142  K 4.0 3.6 3.1* 3.8 -- 3.8  CL 108 109 110 108 -- 105  CO2 28 27 27 23  -- 27  GLUCOSE 132* 125* 118* 126* -- 129*  BUN 8 7 9 11  -- 16  CREATININE 0.64 0.70 0.73 0.74 0.80 --  CALCIUM 8.7 8.2* 8.0* 8.3* -- 9.5  MG -- -- -- -- 1.8 --    RADIOLOGY STUDIES/RESULTS: Dg Chest  2 View  10/27/2011  *RADIOLOGY REPORT*  Clinical Data: Left chest pain, dizziness  CHEST - 2 VIEW  Comparison: 07/19/2011  Findings: Previous CABG.  Vascular clips in the right upper abdomen.  No pneumothorax. Lungs clear.  Heart size and pulmonary vascularity normal.  No effusion.  Visualized bones unremarkable.  IMPRESSION: No acute disease post CABG  Original Report Authenticated By: Osa Craver, M.D.   Dg Elbow Complete Left  10/27/2011  *RADIOLOGY REPORT*  Clinical Data: Bilateral elbow pain.  Trauma.  Fall.  LEFT ELBOW - COMPLETE 3+ VIEW  Comparison: None.  Findings: Anatomic alignment.  No fracture.  Radiocapitellar osteoarthritis is present with small spur of the lateral aspect of the radius.  There is no elbow effusion identified.  IMPRESSION: No displaced fracture or elbow joint effusion.  Mild osteoarthritis.  Original Report Authenticated By: Andreas Newport, M.D.   Dg Elbow Complete Right  10/27/2011   *RADIOLOGY REPORT*  Clinical Data: Fall.  Elbow pain.  RIGHT ELBOW - COMPLETE 3+ VIEW  Comparison: 12/09/2003.  Findings: ORIF of the right elbow is present, little changed from prior examination.  Post-traumatic deformity of the elbow.  No hardware failure is identified.  No elbow effusion.  The radial head appears to have been resected.  IMPRESSION: Post-traumatic changes of the right elbow status post fixation.  No acute osseous abnormality.  Original Report Authenticated By: Andreas Newport, M.D.   Ct Head Wo Contrast  10/27/2011  *RADIOLOGY REPORT*  Clinical Data: Fall.  Head trauma.  CT HEAD WITHOUT CONTRAST  IMPRESSION: Atrophy and chronic ischemic white matter disease without acute intracranial abnormality.  Original Report Authenticated By: Andreas Newport, M.D.    MEDICATIONS: Scheduled Meds:    . amLODipine  5 mg Oral Daily  . bumetanide  0.5 mg Oral Daily  . cefTRIAXone (ROCEPHIN)  IV  1 g Intravenous Q24H  . clopidogrel  75 mg Oral Q breakfast  . enoxaparin  40 mg Subcutaneous Q24H  . insulin aspart  0-15 Units Subcutaneous TID WC  . losartan  50 mg Oral Daily  . metoprolol succinate  25 mg Oral Daily  . pantoprazole  40 mg Oral Q0600  . potassium chloride  20 mEq Oral BID  . rosuvastatin  10 mg Oral Daily   Continuous Infusions:  PRN Meds:.acetaminophen, acetaminophen, albuterol, alum & mag hydroxide-simeth, clorazepate, ondansetron (ZOFRAN) IV, ondansetron, senna-docusate, traMADol  Antibiotics: Anti-infectives     Start     Dose/Rate Route Frequency Ordered Stop   10/27/11 1400   cefTRIAXone (ROCEPHIN) 1 g in dextrose 5 % 50 mL IVPB  Status:  Discontinued        1 g 100 mL/hr over 30 Minutes Intravenous Every 24 hours 10/27/11 1346 10/27/11 1533   10/27/11 1245   cefTRIAXone (ROCEPHIN) 1 g in dextrose 5 % 50 mL IVPB        1 g 100 mL/hr over 30 Minutes Intravenous Every 24 hours 10/27/11 1232            Assessment/Plan: Patient Active Hospital Problem  List:.  UTI (lower urinary tract infection)  Assessment: Afebrile, nontoxic looking.    Plan: Continue with Rocephin, urine cultures showing multiple bacterial morphotypes. She has been afebrile with Rocephin, will empirically treat for a total of 5-7 days and stop.  Chest Pain Assessment: Was a mechanical fall, now having some mild to moderate left upper abdominal and left lower chest pain. This chest pain is easily reproducible. Suspect that perhaps she may have  bruised muscle or ribs. Plan: Rib series did not show any fracture. Pain getting better today.  Diabetes mellitus    Assessment: CBGs stable.    Plan: Continue with SSI for now, resume Amaryl on discharge.   GERD (gastroesophageal reflux disease   Assessment: Stable    Plan: Continue with PPI.   Hypertension    Assessment: Controlled.    Plan: Continue with amlodipine, metoprolol, Cozaar and Bumex.   Hypokalemia  Assessment: Likely secondary to diuretics  Plan: Better, now on KCl 20 mEq twice daily. Need to check electrolytes periodically.  Hyperlipidemia    Assessment: Stable, no indication to check lipids this admission.    Plan: Continue with statin.   Fall   Assessment: Possibly from generalized weakness from UTI.   Plan: Await physical therapy evaluation. May need placement on discharge-as the patient lives alone.  Confusion   Assessment:  History of dementia with delusional features   Plan:  Patient refusing help at home or placement.  She contradicts herself when giving history.  SW/CM advise psych consult   for competency would be helpful as patient is not safe to live at home alone.  Seen by psych - question Lewy body dementia mixed with Alzheimers.  Started Seroquel 12.5 mg qhs.  Disposition: Patient SNF. Patient declined and she wants to go home at the time of discharge. Patient's brother is trying to arrange ALF but so far patient is adamantly refusing.  Family (sister and brother) report there is no one  at home to care for her.  She lives alone & they are too elderly to care for her.  DVT Prophylaxis: Lovenox  Code Status: Full code  Stephani Police 11/01/2011, 11:09 AM

## 2011-11-01 NOTE — Progress Notes (Signed)
Referred to CSW today for ?ALF. Met with patient who does not know any thing about these plans and states she plans to go home at d/c.  Per RNCM patient's brother is arranging for ALF bed at [facility]- have asked that the brother involve the patient in this discussion as we will need her to agree to this unless she is incompetent (Pysch to be consulted). Full assessment and FL2 to follow- Reece Levy, MSW, Theresia Majors [phone]

## 2011-11-01 NOTE — Progress Notes (Signed)
Addendum  Patient seen and examined, chart and data base reviewed.  I agree with the above assessment and plan  For full details please see [name] PA. Note.  Patient seen by psychiatry for evaluation of competency. Patient also have senile dementia. They recommended to start Seroquel 12.5 nightly. Patient probably will be discharged to assisted-living facility in the morning.  [name] A  11/01/2011, 2:55 PM

## 2011-11-01 NOTE — Progress Notes (Signed)
Clinical Social Work Psychiatry  Assessment    Presenting Symptoms/Problems:   Patient presents to the ED with confusion and fall.  Consult is for Capacity to make own decisions and in the safety and best interest of the patient.  Patient brother is working to get patient into ALF and she is refusing.    Psychiatric History: patient denies a past or current psych history.  Reports no previous diagnosis or current medications for her mood or depression/anxiety.  Reports she has never been in a psych hospital or receiving any outpatient counseling from a provider.  Patient has a hx of Dementia senile with delusional features per MD and medical record review, however patient adamantly denies this diagnosis.   Also, per review of chart, patient has hx of anxiety.   Family Collateral Information: Per patient report she lives alone at home and is a widow.  Patient reports her brother lives two houses down and is her main caregiver.  Reports she was married for 20 years then separated from her husband after a ?allegied cheating with neighbor.  Patient reports she worked for [employer] for 33 years, but is retired now.  Reports she still has a valid driver's licenses and reports she still cooks and cleans for herself.  Reports no problems at home.  Patient reports she has one child, a daughter who lives in [place] and grandchildren, but does not see them regularly.  Patient also reports she has a twin sister who lives in town but has been sick.  After discussing the case with the PA Chales Abrahams) her impression slightly differs from a conversation with the brother:   Spoke with brother Vernia Buff (Reggie) and sister Cammie Mcgee Loftis who reports:  1. Patient was diagnosed with Alzheimer's 5 years ago. Dr. Kriste Basque recommended ALF 3 years ago. The patient did not go into ALF then as the brother said he could care for her. Now he can no longer care for her (he's 83 and can't help lift her)  2. The patient recently  took her sister off of her bank accounts and has been bouncing many checks.  3. Patient cant remember what was said from one conversation to the next.  4. Family strongly agrees patient is unsafe to live in her own home alone. They are trying to arrange care at [facility].   Patient reports her brother is at her house all the time and can take care of her. In the same conversation she reports that her brother changed his number and that she does not have it any longer and cannot reach him . She also says that he has never had as he is continually running around with women since the death of his wife. The patient adamantly refuses skilled nursing facility placement. In an attempt to compromise she reluctantly agreed to home health services. Case management reports that the patient's brother is working to obtain assisted living facility placement. Thus far the patient refuses ALF placement stating that she does not want to lose her home.   CSW covering for the weekend spoke with the pts sister Corinne Loftis (POA) on the phone and she reports that pt has been living at home alone and the family agrees that she can not return home alone because she has become so confused and they are concerned about her safety. Pts sister states the pt is not agreeable to SNF but no one in the family can offer 24 hours care. Pts sister requested CSW follow  up with the pts niece Alfonzo Beers [phone] or pts brother Tiburcio Bash [phone]. Lourdes Sledge is the pts twin and she reports she had a stroke 6 weeks ago so her participation in discharge planning has to be very limited at this time.  Emotional Health/Current Symptoms:     Suicide/Self harm: Declined. No history reported in the past.      Attention/Behavioral/Psychotic Symptoms: Patient very pleasant and cooperative during assessment.  Patient story changes from time to time and reports worry that her brother is seeing multiple women in and out of his home.   Declines  any AVH and SI.  Patient reports she does not want ALF and wants to go home.          Recent Loss/Stressor: Declined.      Substance Abuse/Use: Declined.      Interpretive Summary/Anticipated DC Plan:  Will await Psych MD recommendations and have another discussion with son regarding patient and her behaviors and if any are inappropriate.  Will also follow up with MD and CSW regarding consult and impression.  Reviewed patient's PT/OT consult with regards to level of care with MD and PA who report patient will be assessed by [facility].  Will follow up with [facility] CSW Janet/service line and discuss consult and continue to follow.

## 2011-11-02 LAB — GLUCOSE, CAPILLARY: Glucose-Capillary: 102 mg/dL — ABNORMAL HIGH (ref 70–99)

## 2011-11-02 MED ORDER — TUBERCULIN PPD 5 UNIT/0.1ML ID SOLN
5.0000 [IU] | Freq: Once | INTRADERMAL | Status: DC
  Filled 2011-11-02 (×2): qty 0.1

## 2011-11-02 NOTE — Consult Note (Signed)
Patient Identification:  [name] Date of Evaluation:  11/02/2011   History of Present Illness:   [name] is a 77yo WF with complex PMHx including coronary artery disease status post CABG, hyperlipidemia, diabetes, gastroesophageal reflux disease, senile dementia with paranoia, peripheral vascular disease, irritable bowel syndrome who presents to the ED with a fall. Patient does state that over the past few days had been having some dizzy spells where she felt the room was spinning which have since resolved. Patient stated that she was going to the bathroom when she turned quickly and fell on her face. Patient denies any syncope Comprehensive metabolic profile which was done did have an albumin of 3.2 and AST of 39 otherwise was within normal limits. CBC which was done had a platelet count of 121 otherwise was within normal limits. CT of the head was done and was negative for any acute abnormalities. Chest x-ray which was done was unremarkable.   Family felt they could no longer help take care of the patient and a such was also requesting possible placement.  Reports no previous diagnosis or current medications for her mood or depression/anxiety. Reports she has never been in a psych hospital or receiving any outpatient counseling from a provider  Patient reports she worked for [employer] for 33 years, but is retired now. Reports she still has a valid driver's licenses and reports she still cooks and cleans for herself.   As per Collateral  1. Patient was diagnosed with Alzheimer's 5 years ago. Dr. Kriste Basque recommended ALF 3 years ago. The patient did not go into ALF then as the brother said he could care for her. Now he can no longer care for her (he's 83 and can't help lift her)  2. The patient recently took her sister off of her bank accounts and has been bouncing many checks.  3. Patient cant remember what was said from one conversation to the next.  4. Family strongly agrees patient is  unsafe to live in her own home alone. They are trying to arrange care at [facility].   Mental Status Examination: Patient was pleasant on approach but was laughing inappropriately. When i asked her name she told me [name] then said i was just messing with you. Patient reported that she lives near brother but as he is with many woman she dont want to have any contact with him.Patient seem to have hypersexuality and social insensitivity that can be correlated to personality changes because of dementia. When i asked her about her marriage she told me her husband was a " bad egg " and she was married for only few years but dont remember exact dates. Her memory is poor. She dont have rigidity, tremors. She was in bed unable to comment on gait. She denies AVH. Not SI/HI. Her attension/concentration is fair. Abstraction ability is poor. Insight and judgement poor. .   Past Medical History:     Past Medical History  Diagnosis Date  . Bronchitis, mucopurulent recurrent   . Hematoma     Large hematoma from fall, November, 2009, Coumadin stopped  . Pulmonary embolism   . Hypertension   . CAD (coronary artery disease)     2008, question slight anteroapical ischemia  . Subclavian arterial stenosis     PCI  . Hyperlipidemia   . Diabetes mellitus   . GERD (gastroesophageal reflux disease)   . IBS (irritable bowel syndrome)   . Nephrolithiasis   . DJD (degenerative joint disease)   .  Right knee injury   . Osteoporosis   . Senile dementia with delusional features   . Anxiety   . Palpitations   . Hiatal hernia   . Hemorrhoids   . Obesity   . SOB (shortness of breath)   . Esophageal stricture   . Ejection fraction     EF 70%, nuclear, 2007  . Hx of CABG     2003, ... LIMA not used... small caliber  . Carotid artery disease     Doppler, December, 2007, bilateral 0-39%  . Aspirin intolerance   . Dementia     Senile dementia with delusional features  . Warfarin anticoagulation      stopped 09/2008 with large hematoma from fall       Past Surgical History  Procedure Date  . Appendectomy   . Cataract extraction   . Hemorrhoid surgery   . Abdominal hysterectomy   . Cabg  1 vessel w/svg to lad 2003  . Mult orthopedic procedures     Dr. Arnette Norris arm, CTS,cyst removal  . Open heart surgery     Filed Vitals:   11/01/11 2049  BP: 146/79  Pulse: 66  Temp: 98.1 F (36.7 C)  Resp: 20    Lab Results:   BMET    Component Value Date/Time   NA 142 10/31/2011 0600   K 4.0 10/31/2011 0600   CL 108 10/31/2011 0600   CO2 28 10/31/2011 0600   GLUCOSE 132* 10/31/2011 0600   BUN 8 10/31/2011 0600   CREATININE 0.64 10/31/2011 0600   CALCIUM 8.7 10/31/2011 0600   GFRNONAA 82* 10/31/2011 0600   GFRAA >90 10/31/2011 0600    Allergies:  Allergies  Allergen Reactions  . Aspirin     REACTION: pt states GI INTOL to aspirin  . Iodine     REACTION: GI UPSET  . Irbesartan-Hydrochlorothiazide     REACTION: GI UPSET  . Penicillins     REACTION: GI UPSET    Current Medications:  Prior to Admission medications   Medication Sig Start Date End Date Taking? Authorizing Provider  amLODipine-atorvastatin (CADUET) 5-20 MG per tablet Take 1 tablet by mouth daily. 06/18/11  Yes Michele Mcalpine, MD  bumetanide (BUMEX) 0.5 MG tablet Take 1 tablet (0.5 mg total) by mouth daily. 06/18/11  Yes Michele Mcalpine, MD  clopidogrel (PLAVIX) 75 MG tablet TAKE 1 TABLET BY MOUTH EVERY DAY 10/16/11  Yes Luis Abed, MD  glimepiride (AMARYL) 1 MG tablet Take 1/2 tablet by mouth once daily 06/18/11  Yes Michele Mcalpine, MD  ibandronate (BONIVA) 150 MG tablet Take 1 tablet (150 mg total) by mouth every 30 (thirty) days. Take in the morning with a full glass of water, on an empty stomach, and do not take anything else by mouth or lie down for the next 30 min. 06/18/11  Yes Michele Mcalpine, MD  losartan (COZAAR) 50 MG tablet Take 1 tablet (50 mg total) by mouth daily. 06/18/11  Yes Michele Mcalpine,  MD  metFORMIN (GLUCOPHAGE) 500 MG tablet Take 1 tablet (500 mg total) by mouth daily with breakfast. 06/18/11  Yes Michele Mcalpine, MD  metoprolol succinate (TOPROL XL) 25 MG 24 hr tablet Take 1 tablet (25 mg total) by mouth daily. 06/18/11 06/17/12 Yes Michele Mcalpine, MD  omeprazole (PRILOSEC) 20 MG capsule Take 1 capsule (20 mg total) by mouth daily. 06/18/11  Yes Michele Mcalpine, MD  acetaminophen (TYLENOL) 325 MG tablet Take 2 tablets (650 mg  total) by mouth every 6 (six) hours as needed (or Fever >/= 101). 11/01/11 11/11/11  Tora Kindred [place], PA  QUEtiapine (SEROQUEL) 12.5 mg TABS Take 0.5 tablets (12.5 mg total) by mouth at bedtime. 11/01/11   Stephani Police, PA    Social History:    reports that she has never smoked. She does not have any smokeless tobacco history on file. She reports that she does not drink alcohol or use illicit drugs.   Family History:    Family History  Problem Relation Age of Onset  . Stroke Father   . Cancer Mother     bladder cancer  . Hypertension Mother   . Stroke Mother   . Leukemia Sister   . Colon cancer Neg Hx      DIAGNOSIS:   AXIS I Dementia Alz type  AXIS II  Deffered  AXIS III See medical notes.  AXIS IV Chronic medical issues  AXIS V 40     Recommendations:  Because of chronic dementia, frequent falls, mental status with cognitive changes, family unable to take care of patient, patient poor insight and judgement into all this she doesn't have capacity to make decisions for herself regarding placement.  Patient can also be started on seroquel 12.5mg  po daily for insomnia and paranoid type behavior and personality changes.    Eulogio Ditch, MD

## 2011-11-02 NOTE — Progress Notes (Signed)
Patient was found to not have capacity per Psych consult today. Her brother has secured an ALF bed at [facility]- this bed has been confirmed and arrangements are in place for ALF transfer today once PPD is placed. Plan transfer via EMS. Reece Levy, MSW, Theresia Majors [phone]

## 2011-11-02 NOTE — Discharge Summary (Signed)
Addendum  Patient seen and examined, chart and data base reviewed.  I agree with the above assessment and plan  For full details please see [name] PA. note.  Patient admitted for UTI and falls  Going to assisted-living facility.   [name] A  11/02/2011, 2:53 PM

## 2011-11-02 NOTE — Progress Notes (Signed)
CSW with psych service line review MD note and plan for patient is ALF at [facility].  No other needs from Psych at this time per provider.  Dc anticipated today per [facility] CSW.  Will sign off.  Ashley Jacobs, MSW LCSW [phone]

## 2011-11-02 NOTE — Progress Notes (Signed)
   CARE MANAGEMENT NOTE 11/02/2011  Patient:  [name], [name]   Account Number:  [id number]  Date Initiated:  11/01/2011  Documentation initiated by:  Donn Pierini  Subjective/Objective Assessment:   Pt admitted with UTI, falls at home     Action/Plan:   PTA pt lived at home alone, was independent with ADLs, hx of falls at home, PT/OT evals   Anticipated DC Date:  11/02/2011   Anticipated DC Plan:  ASSISTED LIVING / REST HOME  In-house referral  Clinical Social Worker      DC Associate Professor  CM consult      PAC Choice  DURABLE MEDICAL EQUIPMENT  HOME HEALTH   Choice offered to / List presented to:  C-1 Patient      DME agency  [facility]        [facility] agency  [facility]   Status of service:  Completed, signed off Medicare Important Message given?   (If response is "NO", the following Medicare IM given date fields will be blank) Date Medicare IM given:   Date Additional Medicare IM given:    Discharge Disposition:  ASSISTED LIVING  Per UR Regulation:  Reviewed for med. necessity/level of care/duration of stay  Comments:  PCP- Nadel  11/02/11- 1130- Donn Pierini RN, BSN [phone] Pt is for pt to discharge to [facility] today, [facility] to be arranged through [facility]. CSW following for placement needs.  11/01/11- 1145- Donn Pierini RN, BSN [phone] Pt for possible d/c today, spoke with pt at bedside regarding d/c plans. Pt states that she lives at home alone. Per PT/OT notes recommending SNF but pt refusing to go to SNF for rehab. Pt wants to go home. Agreeable to [facility] services. List of agencies for [facility] given to pt. Pt wants to use [facility] for [facility] services. Pt also states that she needs a RW for home. Pt reports that she has tried to call brother but has not gotten in touch with him- he would be her ride home. Pt says that she does not have any other way home as no one else has a key to her home. Called and got in touch with  pt's brother Vernia Buff ([phone]) who per conversation is working on placing pt in [facility]. Representative from [facility] was there speaking with brother and stated that someone from [facility] would be coming to see pt later today. Contact from [facility] is [place] cell [phone] (fax [phone]) Will have CSW start on FL2 and consult for possible ALF placement. CM to continue to follow for d/c needs

## 2011-11-02 NOTE — Discharge Summary (Signed)
Patient ID: [name] MRN: [id number] DOB/AGE: October 11, 1929 77 y.o.  Admit date: 10/27/2011 Discharge date: 11/02/2011  Primary Care Physician:  Michele Mcalpine, MD, MD  Discharge Diagnoses:   Principal Problem:  *Falls  *UTI (lower urinary tract infection)  History of:  Diabetes mellitus  GERD (gastroesophageal reflux disease)  Hypertension  Hyperlipidemia  Fall  Senile Dementia with Delusional Features  Peripheral Vascular Disease  Recurrent Bronchitis  Esophageal Stricture  IBS  Osteoporosis  CAD with history of CABG and Stenting   Current Discharge Medication List    START taking these medications   Details  acetaminophen (TYLENOL) 325 MG tablet Take 2 tablets (650 mg total) by mouth every 6 (six) hours as needed (or Fever >/= 101). Qty: 30 tablet    QUEtiapine (SEROQUEL) 12.5 mg TABS Take 0.5 tablets (12.5 mg total) by mouth at bedtime. Qty: 30 tablet, Refills: 0      CONTINUE these medications which have NOT CHANGED   Details  amLODipine-atorvastatin (CADUET) 5-20 MG per tablet Take 1 tablet by mouth daily. Qty: 30 tablet, Refills: 11    bumetanide (BUMEX) 0.5 MG tablet Take 1 tablet (0.5 mg total) by mouth daily. Qty: 30 tablet, Refills: 11    clopidogrel (PLAVIX) 75 MG tablet TAKE 1 TABLET BY MOUTH EVERY DAY Qty: 30 tablet, Refills: 6    glimepiride (AMARYL) 1 MG tablet Take 1/2 tablet by mouth once daily Qty: 30 tablet, Refills: 5    ibandronate (BONIVA) 150 MG tablet Take 1 tablet (150 mg total) by mouth every 30 (thirty) days. Take in the morning with a full glass of water, on an empty stomach, and do not take anything else by mouth or lie down for the next 30 min. Qty: 1 tablet, Refills: 11    losartan (COZAAR) 50 MG tablet Take 1 tablet (50 mg total) by mouth daily. Qty: 30 tablet, Refills: 11    metFORMIN (GLUCOPHAGE) 500 MG tablet Take 1 tablet (500 mg total) by mouth daily with breakfast. Qty: 30 tablet, Refills: 5    metoprolol  succinate (TOPROL XL) 25 MG 24 hr tablet Take 1 tablet (25 mg total) by mouth daily. Qty: 30 tablet, Refills: 11    omeprazole (PRILOSEC) 20 MG capsule Take 1 capsule (20 mg total) by mouth daily. Qty: 30 capsule, Refills: 11      STOP taking these medications     clorazepate (TRANXENE) 7.5 MG tablet      traMADol (ULTRAM) 50 MG tablet         Consults:  Dr.  Eulogio Ditch, Psychiatry.  Brief H and P: From the admission note:  [name] is a 77yo WF with complex PMHx including coronary artery disease status post CABG, hyperlipidemia, diabetes, gastroesophageal reflux disease, senile dementia with paranoia, peripheral vascular disease, irritable bowel syndrome who presents to the ED with a fall. Patient does state that over the past few days had been having some dizzy spells where she felt the room was spinning which have since resolved. Patient stated that she was going to the bathroom when she turned quickly and fell on her face. Patient denies any syncope. Patient states that her brother who lives next door to her and helps take care of her happened to be in her home and heard her fall and picked her up off the floor, and subsequently brought her to the emergency room. Patient complains of pain in the elbows and the hands. Patient denied any recent fevers no chills no chest pain  no shortness of breath, no nausea, no vomiting, no abdominal pain, no dysuria, no focal neurological symptoms. Patient does endorse that she had some congestion recently. Patient also with some weakness which is generalized. Patient was seen in the ED x-rays of her elbows were done which were negative for any acute fracture or effusion. Urinalysis was done was consistent with a probable urinary tract infection. Comprehensive metabolic profile which was done did have an albumin of 3.2 and AST of 39 otherwise was within normal limits. CBC which was done had a platelet count of 121 otherwise was within normal  limits. CT of the head was done and was negative for any acute abnormalities. Chest x-ray which was done was unremarkable. Per ED physician family felt they could no longer help take care of the patient and a such was also requesting possible placement.  She was started on IV Rocephin for the UTI and completed a 7 day course of IV antibiotics.  Physical therapy and occupational therapy evaluated the patient and both recommended skilled nursing facility placement as the patient required assistance with all aspects of mobility.  The patient adamantly refused skilled nursing facility placement and insisted that she was going to return to her own home and live independently.  History was obtained from the patient's twin sister [name] and her brother [name].  They reported that 5 years ago [name]. [name] was diagnosed with Alzheimer's dementia, and 3 years ago it was recommended by Dr. Kriste Basque that she move to assisted living. At the time her brother [name] was able to care for her and the patient returned to her home in her brother's care. At this point neither her brother [name] nor her sister [name] Sledge can care for her any longer.  (her sister had a stroke, and her brother is now 8).    The patient began to contradict herself on multiple occasions. For example, at one point stating that her brother was with her so frequently he was able to care for her it was almost as though he lived with her.  Then in the same conversation reporting that her brother was never at home he had changed his phone number she was unable to contact him and he was continually out with different women. Because of this type of contradiction and the patient's recurrent history of falling a psychiatry consult was called for competency. Psychiatry evaluated the patient felt that because of chronic dementia, frequent falls, mental status with cognitive changes, family unable to take care of patient, patient with  poor insight and  judgement into all this she doesn't have capacity to make decisions for herself regarding placement.  Psychiatry recommended that [name] be started on Seroquel 12.5 mg qhs.  Her brother obtained placement for her at [facility] assisted living facility.  She will be discharged to [facility] with physical therapy and occupational therapy home health services.    The rest of the patients co-morbidities were stable and quiet during this hospitalization.  Physical Exam on Discharge: General: Alert, awake, oriented x3, currently unhappy about going to ALF rather than her home. HEENT: No bruits, no goiter. Heart: Regular rate and rhythm, without murmurs, rubs, gallops. Lungs: Clear to auscultation bilaterally. Abdomen: Soft, nontender, nondistended, positive bowel sounds. Extremities: No clubbing cyanosis or edema with positive pedal pulses. Neuro: Grossly intact, nonfocal.  Filed Vitals:   11/01/11 0621 11/01/11 1339 11/01/11 2049 11/02/11 0437  BP: 110/63 116/75 146/79 128/75  Pulse: 76 64 66 73  Temp: 98.4 F (36.9 C) 98.2 F (36.8 C) 98.1 F (36.7 C) 98.6 F (37 C)  TempSrc: Oral Oral Oral Oral  Resp: 20 18 20 20   Height:      Weight:    73.4 kg (161 lb 13.1 oz)  SpO2:  93% 94% 99%     Intake/Output Summary (Last 24 hours) at 11/02/11 1358 Last data filed at 11/02/11 5284  Gross per 24 hour  Intake    360 ml  Output      0 ml  Net    360 ml    Basic Metabolic Panel:  Lab 10/31/11 1324 10/30/11 0503 10/27/11 1602  NA 142 144 --  K 4.0 3.6 --  CL 108 109 --  CO2 28 27 --  GLUCOSE 132* 125* --  BUN 8 7 --  CREATININE 0.64 0.70 --  CALCIUM 8.7 8.2* --  MG -- -- 1.8  PHOS -- -- --   Liver Function Tests:  Lab 10/27/11 1020  AST 39*  ALT 23  ALKPHOS 48  BILITOT 0.5  PROT 6.6  ALBUMIN 3.2*   CBC:  Lab 10/29/11 0540 10/28/11 0702 10/27/11 1020  WBC 3.9* 5.1 --  NEUTROABS -- -- 6.0  HGB 11.3* 11.6* --  HCT 34.7* 35.9* --  MCV 94.8 94.5 --    PLT 90* 98* --   CBG:  Lab 11/02/11 1221 11/02/11 0757 11/01/11 2116 11/01/11 1657 11/01/11 1217 11/01/11 0746  GLUCAP 144* 102* 140* 122* 137* 139*   Hemoglobin A1C:  Lab 10/27/11 1332  HGBA1C 6.6*    Micro Results:  Urine cultures showed multiple morphotypes.  Significant Diagnostic Studies:  Dg Chest 2 View  10/27/2011  *RADIOLOGY REPORT*  Clinical Data: Left chest pain, dizziness  CHEST - 2 VIEW  Comparison: 07/19/2011  Findings: Previous CABG.  Vascular clips in the right upper abdomen.  No pneumothorax. Lungs clear.  Heart size and pulmonary vascularity normal.  No effusion.  Visualized bones unremarkable.  IMPRESSION: No acute disease post CABG    Dg Elbow Complete Left  10/27/2011  *RADIOLOGY REPORT*  Clinical Data: Bilateral elbow pain.  Trauma.  Fall.  LEFT ELBOW - COMPLETE 3+ VIEW  Comparison: None.  Findings: Anatomic alignment.  No fracture.  Radiocapitellar osteoarthritis is present with small spur of the lateral aspect of the radius.  There is no elbow effusion identified.  IMPRESSION: No displaced fracture or elbow joint effusion.  Mild osteoarthritis.  Dg Elbow Complete Right  10/27/2011  *RADIOLOGY REPORT*  Clinical Data: Fall.  Elbow pain.  RIGHT ELBOW - COMPLETE 3+ VIEW  Comparison: 12/09/2003.  Findings: ORIF of the right elbow is present, little changed from prior examination.  Post-traumatic deformity of the elbow.  No hardware failure is identified.  No elbow effusion.  The radial head appears to have been resected.  IMPRESSION: Post-traumatic changes of the right elbow status post fixation.  No acute osseous abnormality.   Ct Head Wo Contrast  10/27/2011  *RADIOLOGY REPORT*  Clinical Data: Fall.  Head trauma.  CT HEAD WITHOUT CONTRAST  Technique:  Contiguous axial images were obtained from the base of the skull through the vertex without contrast.  Comparison: 07/21/2011.  Findings: No mass lesion, mass effect, midline shift, hydrocephalus, hemorrhage.  No  acute territorial cortical ischemia/infarct. Atrophy and chronic ischemic white matter disease is present.  Study is mildly degraded by motion artifact.  Repeat imaging was performed with some improvement.  Dilated perivascular spaces are present.  Hyperostosis frontalis interna.  No skull fracture is identified.  Scattered punctate parenchymal calcifications are present.  IMPRESSION: Atrophy and chronic ischemic white matter disease without acute intracranial abnormality.      Disposition and Follow-up: [name] should be seen by Dr. Kriste Basque for a hospital follow up within 2 weeks.  Time spent on Discharge: 45 min.  SignedStephani Police 11/02/2011, 1:58 PM [phone]

## 2011-11-02 NOTE — Progress Notes (Signed)
11/02/11 1642  Pt/ d/c'd to [facility] via stretcher by ems attendants; no problems noted;picked up pt.s meds from pharmacy and ems attendants will take to facility. Leandrew Koyanagi Rael Yo,RN

## 2011-11-02 NOTE — Progress Notes (Signed)
Physical Therapy Treatment Patient Details Name: [name] MRN: [id number] DOB: March 03, 1929 Today's Date: 11/02/2011  PT Assessment/Plan  PT - Assessment/Plan Comments on Treatment Session: Pt continues with decreased cognition and decreased safety awareness with mobility. Per nursing pt has repeatedly been attempting to ambulate with out assistance although instructed to call for assistance secondary to risk for falls.  Pt told therapist the same story about her brother >4x, did not appear to realize she had told the story previously. SNF/24 hour supervision recommended. Pt's SpO2 dropped to 90% on room air with ambulation however returned to 93% with cues for deep breathing.  PT Plan: Discharge plan remains appropriate PT Frequency: Min 2X/week Follow Up Recommendations: Skilled nursing facility/ 24 hour supervision Equipment Recommended: Defer to next venue PT Goals  Acute Rehab PT Goals PT Goal Formulation: With patient Time For Goal Achievement: 2 weeks Pt will go Supine/Side to Sit: with supervision PT Goal: Supine/Side to Sit - Progress: Met Pt will go Sit to Supine/Side: with supervision Pt will go Sit to Stand: with supervision PT Goal: Sit to Stand - Progress: Met Pt will go Stand to Sit: with supervision PT Goal: Stand to Sit - Progress: Met Pt will Ambulate: >150 feet;with supervision;with least restrictive assistive device PT Goal: Ambulate - Progress: Progressing toward goal Pt will Go Up / Down Stairs: 3-5 stairs;with supervision;with rail(s) PT Goal: Up/Down Stairs - Progress: Progressing toward goal  PT Treatment Precautions/Restrictions  Precautions Precautions: Fall Restrictions Weight Bearing Restrictions: No Mobility (including Balance) Bed Mobility Supine to Sit: HOB elevated (Comment degrees);5: Supervision (30%) Supine to Sit Details (indicate cue type and reason): Supervision for safety Transfers Transfers: Yes Sit to Stand: From bed;With upper  extremity assist;5: Supervision Sit to Stand Details (indicate cue type and reason): Performed 2x. Repeated verbal cues for saf hand placement Stand to Sit: To chair/3-in-1;5: Supervision Stand to Sit Details: Performed 2x. Verbal cues for safe hand placement Ambulation/Gait Ambulation/Gait: Yes Ambulation/Gait Assistance: 4: Min assist Ambulation/Gait Assistance Details (indicate cue type and reason): Min assist secondary to some lateral instability, pt lifting one arm off of RW with gait at times greatly increasing fall risk.  Ambulation Distance (Feet): 125 Feet Assistive device: Rolling walker Gait Pattern: Decreased stride length;Trunk flexed  Posture/Postural Control Posture/Postural Control: No significant limitations Balance Balance Assessed: Yes Static Standing Balance Static Standing - Balance Support: No upper extremity supported (RW in front prn) Static Standing - Level of Assistance: 4: Min assist Static Standing - Comment/# of Minutes: Also performed normal stance with eyes open/eyes closed.  Tandem Stance - Right Leg: 30  (sec x 3reps) Tandem Stance - Left Leg: 30  (sec x 3reps) Rhomberg - Eyes Opened: 10  (sec x 3reps) Rhomberg - Eyes Closed: 10  (sec x 3reps) Exercise  General Exercises - Lower Extremity Gluteal Sets: AROM;Both;10 reps;Seated Long Arc Quad: AROM;20 reps;Both;Seated (manually resisted) Heel Slides: AROM;Both;20 reps;Seated (manually resisted) Hip Flexion/Marching: AROM;Both;Other reps (comment) (2 x 10 rep) End of Session PT - End of Session Equipment Utilized During Treatment: Gait belt Activity Tolerance: Patient tolerated treatment well Patient left: in chair;with call bell in reach (with chair alarm set) Nurse Communication: Mobility status for transfers;Mobility status for ambulation General Behavior During Session: [facility] for tasks performed Cognition: Impaired, at baseline  Sherie Don 11/02/2011, 4:04 PM Sherie Don) Carleene Mains PT,  DPT Acute Rehabilitation [phone]

## 2011-11-05 ENCOUNTER — Telehealth: Payer: Self-pay | Admitting: Pulmonary Disease

## 2011-11-05 NOTE — Telephone Encounter (Signed)
Spoke with pt's niece Lewanda Rife, she states needs letter for social security stating that she handles the pt's finances. She states that she will come here and pick up the letter once done. Please advise, thanks!

## 2011-11-07 ENCOUNTER — Encounter: Payer: Self-pay | Admitting: *Deleted

## 2011-11-07 NOTE — Telephone Encounter (Signed)
Letter has been written and printed and signed by SN.  Called and lm with family for clara to call me back to make her aware that letter is ready to be picked up.

## 2011-11-07 NOTE — Telephone Encounter (Signed)
Advised [name] letter is ready for pick up.  Nothing further needed.   [name]

## 2011-11-17 ENCOUNTER — Emergency Department (HOSPITAL_COMMUNITY)
Admission: EM | Admit: 2011-11-17 | Discharge: 2011-11-17 | Disposition: A | Discharge status: Home / Self Care | Payer: Medicare Other | Attending: Emergency Medicine | Admitting: Emergency Medicine

## 2011-11-17 ENCOUNTER — Other Ambulatory Visit: Payer: Self-pay

## 2011-11-17 ENCOUNTER — Emergency Department (HOSPITAL_COMMUNITY): Payer: Medicare Other

## 2011-11-17 ENCOUNTER — Encounter (HOSPITAL_COMMUNITY): Payer: Self-pay | Admitting: *Deleted

## 2011-11-17 DIAGNOSIS — E119 Type 2 diabetes mellitus without complications: Secondary | ICD-10-CM | POA: Insufficient documentation

## 2011-11-17 DIAGNOSIS — R220 Localized swelling, mass and lump, head: Secondary | ICD-10-CM | POA: Insufficient documentation

## 2011-11-17 DIAGNOSIS — T1490XA Injury, unspecified, initial encounter: Secondary | ICD-10-CM | POA: Insufficient documentation

## 2011-11-17 DIAGNOSIS — Z79899 Other long term (current) drug therapy: Secondary | ICD-10-CM | POA: Insufficient documentation

## 2011-11-17 DIAGNOSIS — S01501A Unspecified open wound of lip, initial encounter: Secondary | ICD-10-CM | POA: Insufficient documentation

## 2011-11-17 DIAGNOSIS — R221 Localized swelling, mass and lump, neck: Secondary | ICD-10-CM | POA: Insufficient documentation

## 2011-11-17 DIAGNOSIS — I25810 Atherosclerosis of coronary artery bypass graft(s) without angina pectoris: Secondary | ICD-10-CM | POA: Insufficient documentation

## 2011-11-17 DIAGNOSIS — W19XXXA Unspecified fall, initial encounter: Secondary | ICD-10-CM | POA: Insufficient documentation

## 2011-11-17 DIAGNOSIS — R51 Headache: Secondary | ICD-10-CM | POA: Insufficient documentation

## 2011-11-17 DIAGNOSIS — I10 Essential (primary) hypertension: Secondary | ICD-10-CM | POA: Insufficient documentation

## 2011-11-17 DIAGNOSIS — S01511A Laceration without foreign body of lip, initial encounter: Secondary | ICD-10-CM

## 2011-11-17 DIAGNOSIS — Y9210 Unspecified residential institution as the place of occurrence of the external cause: Secondary | ICD-10-CM | POA: Insufficient documentation

## 2011-11-17 DIAGNOSIS — M5030 Other cervical disc degeneration, unspecified cervical region: Secondary | ICD-10-CM | POA: Insufficient documentation

## 2011-11-17 MED ORDER — LIDOCAINE HCL (PF) 1 % IJ SOLN: 5.0000 mL | Freq: Once | INTRAMUSCULAR | Status: DC

## 2011-11-17 MED ORDER — TETANUS-DIPHTH-ACELL PERTUSSIS 5-2.5-18.5 LF-MCG/0.5 IM SUSP
0.5000 mL | Freq: Once | INTRAMUSCULAR | Status: AC
  Administered 2011-11-17: 0.5 mL via INTRAMUSCULAR
  Filled 2011-11-17 (×2): qty 0.5

## 2011-11-17 MED ORDER — LIDOCAINE HCL 1 % IJ SOLN
5.0000 mL | Freq: Once | INTRAMUSCULAR | Status: AC
  Administered 2011-11-17: 5 mL
  Filled 2011-11-17: qty 20

## 2011-11-17 MED ORDER — BUPIVACAINE HCL (PF) 0.5 % IJ SOLN
10.0000 mL | Freq: Once | INTRAMUSCULAR | Status: AC
  Administered 2011-11-17: 10 mL
  Filled 2011-11-17: qty 30

## 2011-11-17 NOTE — ED Provider Notes (Signed)
BP 156/71  Pulse 73  Temp(Src) 97.8 F (36.6 C) (Oral)  Resp 19  SpO2 96% Pt with mechanical fall Has laceration to lip but no other facial injury She reports feeling improved Maex4, awake/alert Pt seen with nurse practitioner   Joya Gaskins, MD 11/17/11 [phone]

## 2011-11-17 NOTE — ED Notes (Signed)
Pt. Was at [facility], assisted living, and slipped on tile floor and landed on face. Laceration to top lip, teeth ok, tongue ok.

## 2011-11-17 NOTE — ED Notes (Signed)
GMW:[id number]<OZ> Expected date:11/17/11<BR> Expected time: 1:25 PM<BR> Means of arrival:Ambulance<BR> Comments:<BR> M42 - 82yoF Fall, lip lac, brief LOC, plavix

## 2011-11-17 NOTE — ED Notes (Signed)
Pt removed from [facility], log rolled for primary/secaondary assessment. No evidence of extrm injury or step offs at palpation of spine. Pt with complete ROM. Pt with baseline dementia, has been in nsg home x2 weeks. Is required to walk with walker, but forgets often. Larey Seat striking her mouth on the tile floor.

## 2011-11-17 NOTE — ED Provider Notes (Signed)
History     CSN: [id number]  Arrival date & time 11/17/11  1338   First MD Initiated Contact with Patient 11/17/11 1347      Chief Complaint  Patient presents with  . Fall    (Consider location/radiation/quality/duration/timing/severity/associated sxs/prior treatment) HPI Comments: Patient is unsure how she fell she was attending a class at the assisted living facility.  He did on her face now has a laceration to her upper lip.  Teeth are intact.  No abrasions, bruising to nose for head.   Patient is a 78 y.o. female presenting with fall. The history is provided by the patient.  Fall The fall occurred while walking. She fell from a height of 3 to 5 ft. She landed on a hard floor. The point of impact was the head. Pertinent negatives include no nausea.    Past Medical History  Diagnosis Date  . Bronchitis, mucopurulent recurrent   . Hematoma     Large hematoma from fall, November, 2009, Coumadin stopped  . Pulmonary embolism   . Hypertension   . CAD (coronary artery disease)     2008, question slight anteroapical ischemia  . Subclavian arterial stenosis     PCI  . Hyperlipidemia   . Diabetes mellitus   . GERD (gastroesophageal reflux disease)   . IBS (irritable bowel syndrome)   . Nephrolithiasis   . DJD (degenerative joint disease)   . Right knee injury   . Osteoporosis   . Senile dementia with delusional features   . Anxiety   . Palpitations   . Hiatal hernia   . Hemorrhoids   . Obesity   . SOB (shortness of breath)   . Esophageal stricture   . Ejection fraction     EF 70%, nuclear, 2007  . Hx of CABG     2003, ... LIMA not used... small caliber  . Carotid artery disease     Doppler, December, 2007, bilateral 0-39%  . Aspirin intolerance   . Dementia     Senile dementia with delusional features  . Warfarin anticoagulation     stopped 09/2008 with large hematoma from fall    Past Surgical History  Procedure Date  . Appendectomy   . Cataract extraction    . Hemorrhoid surgery   . Abdominal hysterectomy   . Cabg  1 vessel w/svg to lad 2003  . Mult orthopedic procedures     Dr. Arnette Norris arm, CTS,cyst removal  . Open heart surgery     Family History  Problem Relation Age of Onset  . Stroke Father   . Cancer Mother     bladder cancer  . Hypertension Mother   . Stroke Mother   . Leukemia Sister   . Colon cancer Neg Hx     History  Substance Use Topics  . Smoking status: Never Smoker   . Smokeless tobacco: Not on file  . Alcohol Use: No    OB History    Grav Para Term Preterm Abortions TAB SAB Ect Mult Living                  Review of Systems  HENT: Positive for facial swelling. Negative for rhinorrhea.   Eyes: Negative for visual disturbance.  Respiratory: Negative for shortness of breath.   Cardiovascular: Negative for chest pain and leg swelling.  Gastrointestinal: Negative for nausea.  Musculoskeletal: Positive for back pain.  Skin: Negative.   Neurological: Negative for dizziness and weakness.    Allergies  Aspirin; Iodine;  Irbesartan-hydrochlorothiazide; and Penicillins  Home Medications   Current Outpatient Rx  Name Route Sig Dispense Refill  . AMLODIPINE-ATORVASTATIN 5-20 MG PO TABS Oral Take 1 tablet by mouth daily. 30 tablet 11  . BUMETANIDE 0.5 MG PO TABS Oral Take 1 tablet (0.5 mg total) by mouth daily. 30 tablet 11  . CLOPIDOGREL BISULFATE 75 MG PO TABS  TAKE 1 TABLET BY MOUTH EVERY DAY 30 tablet 6  . GLIMEPIRIDE 1 MG PO TABS  Take 1/2 tablet by mouth once daily 30 tablet 5  . IBANDRONATE SODIUM 150 MG PO TABS Oral Take 1 tablet (150 mg total) by mouth every 30 (thirty) days. Take in the morning with a full glass of water, on an empty stomach, and do not take anything else by mouth or lie down for the next 30 min. 1 tablet 11  . LOSARTAN POTASSIUM 50 MG PO TABS Oral Take 1 tablet (50 mg total) by mouth daily. 30 tablet 11  . METFORMIN HCL 500 MG PO TABS Oral Take 1 tablet (500 mg total) by mouth  daily with breakfast. 30 tablet 5  . METOPROLOL SUCCINATE ER 25 MG PO TB24 Oral Take 1 tablet (25 mg total) by mouth daily. 30 tablet 11  . OMEPRAZOLE 20 MG PO CPDR Oral Take 1 capsule (20 mg total) by mouth daily. 30 capsule 11    Pt needs follow up with Dr. Kriste Basque for refills  . QUETIAPINE FUMARATE 25 MG PO TABS Oral Take 12.5 mg by mouth at bedtime.      BP 150/58  Pulse 69  Temp(Src) 98.6 F (37 C) (Oral)  Resp 22  SpO2 97%  Physical Exam  Constitutional: She appears well-developed and well-nourished.  HENT:  Head: Normocephalic.  Mouth/Throat: Oropharynx is clear and moist.    Eyes: Pupils are equal, round, and reactive to light.  Neck: Trachea normal. Spinous process tenderness present. Decreased range of motion present.  Cardiovascular: Normal rate.   Pulmonary/Chest: Effort normal.  Abdominal: Soft. She exhibits no distension. There is no tenderness.  Musculoskeletal: She exhibits no edema.       Right knee: She exhibits no swelling, no ecchymosis and no erythema. tenderness found.  Neurological: She is alert. No cranial nerve deficit. GCS eye subscore is 4. GCS verbal subscore is 5. GCS motor subscore is 6.       confused  Skin: Skin is warm and dry.    ED Course  LACERATION REPAIR Date/Time: 11/17/2011 5:33 PM Performed by: Arman Filter Authorized by: Arman Filter Consent: Verbal consent obtained. Risks and benefits: risks, benefits and alternatives were discussed Consent given by: patient Patient understanding: patient states understanding of the procedure being performed Patient identity confirmed: verbally with patient Time out: Immediately prior to procedure a "time out" was called to verify the correct patient, procedure, equipment, support staff and site/side marked as required. Body area: mouth Location details: upper lip, interior Laceration length: 1 cm Foreign bodies: unknown Tendon involvement: none Nerve involvement: none Vascular damage:  no Anesthesia: nerve block Local anesthetic: bupivacaine 0.5% without epinephrine Anesthetic total: 2 ml Patient sedated: no Irrigation solution: saline Irrigation method: tap and syringe Amount of cleaning: standard Debridement: none Degree of undermining: none Mucous membrane closure: 6-0 Chromic gut Number of sutures: 5 Technique: simple Approximation: close Patient tolerance: Patient tolerated the procedure well with no immediate complications.   (including critical care time)  Labs Reviewed - No data to display Ct Head Wo Contrast  11/17/2011  *RADIOLOGY REPORT*  Clinical Data:  78 year old female status post fall.  Pain, headache.  CT HEAD WITHOUT CONTRAST CT CERVICAL SPINE WITHOUT CONTRAST  Technique:  Multidetector CT imaging of the head and cervical spine was performed following the standard protocol without intravenous contrast.  Multiplanar CT image reconstructions of the cervical spine were also generated.  Comparison:  Head CT 10/27/2011.  Head and face CT 06/13/2006.  CT HEAD  Findings: There is a fluid level the left maxillary sinus with partially hyperdense fluid.  No left maxillary fracture is visible, but much of the sinus was not included.  Other Visualized paranasal sinuses and mastoids are clear.  Orbits appear intact.  No focal scalp hematoma.  Hyperostosis of the calvarium.  No calvarial fracture identified.  Stable cerebral volume.  No ventriculomegaly. Calcified atherosclerosis at the skull base.  No acute intracranial hemorrhage identified.  No evidence of cortically based acute infarction identified.  No midline shift, mass effect, or evidence of mass lesion.  IMPRESSION: 1.  No acute traumatic injury to the brain identified. 2.  Fluid level suspicious for blood in the left maxillary sinus. This could be related to epistaxis or facial fracture. Clinical correlation recommended. 3.  Cervical findings are below.  CT CERVICAL SPINE  Findings: Straightening of cervical  lordosis.  Trace anterolisthesis of C4 on C5, C5 on C6, and C6 on C7 associated with facet hypertrophy. Cervicothoracic junction alignment is within normal limits.  Bilateral posterior element alignment is within normal limits.  Degenerative changes at the left C1 lateral mass and C2 articular pillar with small ossific fragment which appear chronic.  The odontoid is intact.  Visualized skull base is intact. No atlanto-occipital dissociation.  No acute cervical fracture identified.  Lung apices are clear.  Widespread calcified atherosclerosis.  Partially retropharyngeal course of both carotid arteries.  IMPRESSION: 1. No acute fracture or listhesis identified in the cervical spine. Ligamentous injury is not excluded.  2.  Small degenerative ossific fragments at the left C1-C2 lateral mass articulation  associated with underlying joint space loss. 3.  Multilevel mild spondylolisthesis appears degenerative.  Original Report Authenticated By: Harley Hallmark, M.D.   Ct Cervical Spine Wo Contrast  11/17/2011  *RADIOLOGY REPORT*  Clinical Data:  78 year old female status post fall.  Pain, headache.  CT HEAD WITHOUT CONTRAST CT CERVICAL SPINE WITHOUT CONTRAST  Technique:  Multidetector CT imaging of the head and cervical spine was performed following the standard protocol without intravenous contrast.  Multiplanar CT image reconstructions of the cervical spine were also generated.  Comparison:  Head CT 10/27/2011.  Head and face CT 06/13/2006.  CT HEAD  Findings: There is a fluid level the left maxillary sinus with partially hyperdense fluid.  No left maxillary fracture is visible, but much of the sinus was not included.  Other Visualized paranasal sinuses and mastoids are clear.  Orbits appear intact.  No focal scalp hematoma.  Hyperostosis of the calvarium.  No calvarial fracture identified.  Stable cerebral volume.  No ventriculomegaly. Calcified atherosclerosis at the skull base.  No acute intracranial hemorrhage  identified.  No evidence of cortically based acute infarction identified.  No midline shift, mass effect, or evidence of mass lesion.  IMPRESSION: 1.  No acute traumatic injury to the brain identified. 2.  Fluid level suspicious for blood in the left maxillary sinus. This could be related to epistaxis or facial fracture. Clinical correlation recommended. 3.  Cervical findings are below.  CT CERVICAL SPINE  Findings: Straightening of cervical lordosis.  Trace anterolisthesis  of C4 on C5, C5 on C6, and C6 on C7 associated with facet hypertrophy. Cervicothoracic junction alignment is within normal limits.  Bilateral posterior element alignment is within normal limits.  Degenerative changes at the left C1 lateral mass and C2 articular pillar with small ossific fragment which appear chronic.  The odontoid is intact.  Visualized skull base is intact. No atlanto-occipital dissociation.  No acute cervical fracture identified.  Lung apices are clear.  Widespread calcified atherosclerosis.  Partially retropharyngeal course of both carotid arteries.  IMPRESSION: 1. No acute fracture or listhesis identified in the cervical spine. Ligamentous injury is not excluded.  2.  Small degenerative ossific fragments at the left C1-C2 lateral mass articulation  associated with underlying joint space loss. 3.  Multilevel mild spondylolisthesis appears degenerative.  Original Report Authenticated By: Harley Hallmark, M.D.     1. Fall   2. Laceration of lip     Will ct head and neck Sutrue repair to upper lip   MDM  Fall patient on plavix concern for intracranial injury        Arman Filter, NP 11/17/11 1401  Arman Filter, NP 11/17/11 1402 lip sutured after regional block.  No complications.  Head CT is normal.  We'll discharge patient home to her assisted living facility  Arman Filter, NP 11/17/11 2009  Arman Filter, NP 11/17/11 2009

## 2011-11-18 NOTE — ED Provider Notes (Signed)
Medical screening examination/treatment/procedure(s) were conducted as a shared visit with non-physician practitioner(s) and myself.  I personally evaluated the patient during the encounter   Joya Gaskins, MD 11/18/11 1551

## 2011-11-20 ENCOUNTER — Telehealth: Payer: Self-pay | Admitting: Pulmonary Disease

## 2011-11-20 NOTE — Telephone Encounter (Signed)
Called and spoke with clara and she is aware of forms completed and mailed off.

## 2011-11-20 NOTE — Telephone Encounter (Signed)
I spoke with [name] and she states she had a VM from leigh advising her to call her back. She is not sure what this is regarding. I do not see where leigh had tried to call. Please advise leigh if you tried to call thanks

## 2011-11-29 ENCOUNTER — Ambulatory Visit: Payer: Medicare Other | Admitting: Pulmonary Disease

## 2012-01-07 ENCOUNTER — Telehealth: Payer: Self-pay | Admitting: Pulmonary Disease

## 2012-01-07 NOTE — Telephone Encounter (Signed)
LMOMTCB x 1 on home and cell #.

## 2012-01-08 NOTE — Telephone Encounter (Signed)
Spoke with pt's Brother. He states that Dr. Jeanie Sewer, pt's dentist started her on clindamycin and wants her to take this now and before each visit with him from now on. He is asking [place] to send fax to [facility] letting them know this so they will dispense med to pt. I advised that since Dr. Jeanie Sewer is the prescribing doc, his office will need to take care of this. He verbalized understanding and states will call Dr. Jeanie Sewer. Nothing further needed.

## 2012-01-08 NOTE — Telephone Encounter (Signed)
Pt's brother returned call. He says pt went to see her dentist yesterday. He says [facility] needs a rx faxed to them so they can dispense this med to pt. Hazel Sams

## 2012-02-14 ENCOUNTER — Telehealth: Payer: Self-pay | Admitting: Pulmonary Disease

## 2012-02-14 NOTE — Telephone Encounter (Signed)
LMTCB

## 2012-02-14 NOTE — Telephone Encounter (Signed)
[name] returned [name] call.  [name]

## 2012-02-14 NOTE — Telephone Encounter (Signed)
Called and spoke with charlotte --home health nurse--she is requesting an order for ua to be done and PT eval for the pt.  Pt has been having increase in confusion and weakness the last couple of days.  Claris Gower needed an order before she could see the pt to eval.  VO given to her for ua and to send for culture if positive.  Claris Gower will call back with any other concerns.

## 2012-02-18 ENCOUNTER — Telehealth: Payer: Self-pay | Admitting: Pulmonary Disease

## 2012-02-18 NOTE — Telephone Encounter (Signed)
Per SN---UA results   +UTI and rec to treat with cipro 250mg   #14  1 po bid with no refills.  This has been written out and faxed to [facility] at [phone].

## 2012-02-18 NOTE — Telephone Encounter (Signed)
Called and spoke with [name]---she is going to fax the results of the UA that she does have and we are still waiting on the culture.  Will wait for her to call me back.

## 2012-02-22 ENCOUNTER — Encounter: Payer: Self-pay | Admitting: Pulmonary Disease

## 2012-05-26 ENCOUNTER — Telehealth: Payer: Self-pay | Admitting: Pulmonary Disease

## 2012-05-27 NOTE — Telephone Encounter (Signed)
I spoke [place] and she states they received a form from [place] for diabetic testing strips but nothing was on it and the doctor's information was missing all the information. She is going to refax this to the upfront fax #. Will sign off message

## 2012-05-29 ENCOUNTER — Telehealth: Payer: Self-pay | Admitting: Pulmonary Disease

## 2012-05-29 NOTE — Telephone Encounter (Signed)
Please advise if the from was received and corrections made? Thanks. Carron Curie, CMA

## 2012-06-02 NOTE — Telephone Encounter (Signed)
No form has been sent back on this pt.  Thanks

## 2012-06-03 NOTE — Telephone Encounter (Signed)
Form has been updated and faxed back to [facility] [place] at [phone]

## 2012-06-03 NOTE — Telephone Encounter (Signed)
I called Wilkie Aye back at [employer] and was sent to a voicemail. I LM stating wee have not received the form and to fax it ti the triage fax. I will hold message until fax received.Carron Curie, CMA

## 2012-06-03 NOTE — Telephone Encounter (Signed)
Form received and given to leigh. Carron Curie, CMA

## 2012-06-05 ENCOUNTER — Telehealth: Payer: Self-pay | Admitting: Pulmonary Disease

## 2012-06-05 NOTE — Telephone Encounter (Signed)
I checked SN's scan folder and it was empty. Will forward to [name] to refax once this is been scanned in. Thanks!

## 2012-06-09 ENCOUNTER — Telehealth: Payer: Self-pay | Admitting: Pulmonary Disease

## 2012-06-09 NOTE — Telephone Encounter (Signed)
They did receive the form that was faxed and they sent the form back and stated that they could not accept this form since the date was not written out for 2013.  This form is in SN sign folder to correct and once this has been done we will fax the corrected form back.  thanks

## 2012-06-09 NOTE — Telephone Encounter (Signed)
Will forward to leigh to look out for forms 

## 2012-06-09 NOTE — Telephone Encounter (Signed)
Checking on status of the form.  Advised that we have rec'd the form & as soon as it is completed, we will fax the form back.  [name]

## 2012-06-10 NOTE — Telephone Encounter (Signed)
New form has been completed and faxed to [phone].  Will place in scan folder to be scanned into the pts chart.

## 2012-06-11 ENCOUNTER — Telehealth: Payer: Self-pay | Admitting: Pulmonary Disease

## 2012-06-11 NOTE — Telephone Encounter (Signed)
Called [facility], spoke with [facility].  Was advised I could print form off, add NPI at the designated NPI space on form which was left blank, and refax to [phone].  This has been done.  Katelyn aware.

## 2012-08-19 ENCOUNTER — Telehealth: Payer: Self-pay | Admitting: Pulmonary Disease

## 2012-08-19 NOTE — Telephone Encounter (Signed)
Called spoke with Brett Canales who reported that: Patient fell last week - nothing else is hurt, she stretched out her hand to break her fall.  Now has bruising/some pain on the left hand No portable xray done - was ordered, but never done per Brett Canales Can move fingers ok, hurts if puts pressure on her hand   Bruising in palm of left hand.    OV w/ TP tomorrow morning 10.16.13 @ 1030.

## 2012-08-20 ENCOUNTER — Ambulatory Visit (INDEPENDENT_AMBULATORY_CARE_PROVIDER_SITE_OTHER): Payer: Medicare Other | Admitting: Adult Health

## 2012-08-20 ENCOUNTER — Ambulatory Visit (INDEPENDENT_AMBULATORY_CARE_PROVIDER_SITE_OTHER)
Admission: RE | Admit: 2012-08-20 | Discharge: 2012-08-20 | Disposition: A | Discharge status: Home / Self Care | Payer: Medicare Other | Source: Ambulatory Visit | Attending: Adult Health | Admitting: Adult Health

## 2012-08-20 ENCOUNTER — Other Ambulatory Visit (INDEPENDENT_AMBULATORY_CARE_PROVIDER_SITE_OTHER): Payer: Medicare Other

## 2012-08-20 ENCOUNTER — Encounter: Payer: Self-pay | Admitting: Adult Health

## 2012-08-20 VITALS — BP 124/66 | HR 69 | Temp 97.0°F | Ht 62.0 in | Wt 171.0 lb

## 2012-08-20 DIAGNOSIS — F411 Generalized anxiety disorder: Secondary | ICD-10-CM

## 2012-08-20 DIAGNOSIS — K589 Irritable bowel syndrome without diarrhea: Secondary | ICD-10-CM

## 2012-08-20 DIAGNOSIS — M79642 Pain in left hand: Secondary | ICD-10-CM

## 2012-08-20 DIAGNOSIS — E119 Type 2 diabetes mellitus without complications: Secondary | ICD-10-CM

## 2012-08-20 DIAGNOSIS — M79609 Pain in unspecified limb: Secondary | ICD-10-CM

## 2012-08-20 DIAGNOSIS — I10 Essential (primary) hypertension: Secondary | ICD-10-CM

## 2012-08-20 DIAGNOSIS — M79643 Pain in unspecified hand: Secondary | ICD-10-CM

## 2012-08-20 LAB — CBC WITH DIFFERENTIAL/PLATELET
Basophils Absolute: 0 10*3/uL (ref 0.0–0.1)
Basophils Relative: 0.4 % (ref 0.0–3.0)
Eosinophils Absolute: 0.2 10*3/uL (ref 0.0–0.7)
Hemoglobin: 13.6 g/dL (ref 12.0–15.0)
MCHC: 33.1 g/dL (ref 30.0–36.0)
MCV: 93.8 fl (ref 78.0–100.0)
Monocytes Absolute: 0.7 10*3/uL (ref 0.1–1.0)
Neutro Abs: 5.1 10*3/uL (ref 1.4–7.7)
RBC: 4.36 Mil/uL (ref 3.87–5.11)
RDW: 13.3 % (ref 11.5–14.6)

## 2012-08-20 LAB — TSH: TSH: 1.94 u[IU]/mL (ref 0.35–5.50)

## 2012-08-20 LAB — BASIC METABOLIC PANEL
BUN: 14 mg/dL (ref 6–23)
CO2: 28 mEq/L (ref 19–32)
Calcium: 9.6 mg/dL (ref 8.4–10.5)
Chloride: 108 mEq/L (ref 96–112)
Creatinine, Ser: 0.9 mg/dL (ref 0.4–1.2)

## 2012-08-20 NOTE — Assessment & Plan Note (Signed)
Discussed diet  Check labs today  Cont on current regimen .
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
## 2012-08-20 NOTE — Patient Instructions (Addendum)
We will call with xray and lab results Elevate hand As needed   Tylenol As needed  For pain.  follow up Dr. Kriste Basque  In 6 months and As needed

## 2012-08-20 NOTE — Assessment & Plan Note (Signed)
Controlled on rx  bmet today  

## 2012-08-20 NOTE — Assessment & Plan Note (Signed)
S/p fall w/ bruising  Check xray today  Elevated as needed  Tylenol As needed

## 2012-08-21 ENCOUNTER — Other Ambulatory Visit: Payer: Self-pay | Admitting: Adult Health

## 2012-08-21 ENCOUNTER — Encounter: Payer: Self-pay | Admitting: Adult Health

## 2012-08-21 DIAGNOSIS — S6292XA Unspecified fracture of left wrist and hand, initial encounter for closed fracture: Secondary | ICD-10-CM

## 2012-08-21 DIAGNOSIS — S6290XA Unspecified fracture of unspecified wrist and hand, initial encounter for closed fracture: Secondary | ICD-10-CM

## 2012-08-21 NOTE — Progress Notes (Signed)
Quick Note:  Brett Canales at [employer] is aware. ______

## 2012-08-21 NOTE — Progress Notes (Signed)
Quick Note:  Both of the pt's numbers are d/c'ed Called and spoke with her Niece and notified of results/recs She will call "the home" where pt resides now and have nurse call us to coordinate the referral to hand specialist Order sent to [facility] ______ 

## 2012-08-21 NOTE — Progress Notes (Signed)
Quick Note:  Both of the pt's numbers are d/c'ed Called and spoke with her Niece and notified of results/recs She will call "the home" where pt resides now and have nurse call us to coordinate the referral to hand specialist Order sent to [facility] ______

## 2012-08-21 NOTE — Progress Notes (Signed)
Quick Note:  Spoke with pt's niece Clara and notified of results of labs. She verbalized understanding and states no questions. She will inform the pt. ______

## 2012-08-25 ENCOUNTER — Other Ambulatory Visit: Payer: Self-pay | Admitting: Orthopedic Surgery

## 2012-08-25 NOTE — Progress Notes (Signed)
Pt has dementia-lives in assist living-[place] [facility]-head nurse-donna-[phone]-fax-[phone] Niece-Clara coming to sign permit-and will bring POA papers Will need istat

## 2012-08-26 ENCOUNTER — Other Ambulatory Visit: Payer: Self-pay | Admitting: Orthopedic Surgery

## 2012-08-27 ENCOUNTER — Ambulatory Visit (HOSPITAL_BASED_OUTPATIENT_CLINIC_OR_DEPARTMENT_OTHER)
Admission: RE | Admit: 2012-08-27 | Discharge: 2012-08-27 | Disposition: A | Discharge status: Home / Self Care | Payer: Medicare Other | Source: Ambulatory Visit | Attending: Orthopedic Surgery | Admitting: Orthopedic Surgery

## 2012-08-27 ENCOUNTER — Encounter (HOSPITAL_BASED_OUTPATIENT_CLINIC_OR_DEPARTMENT_OTHER)
Admission: RE | Disposition: A | Discharge status: Home / Self Care | Payer: Self-pay | Source: Ambulatory Visit | Attending: Orthopedic Surgery

## 2012-08-27 ENCOUNTER — Encounter (HOSPITAL_BASED_OUTPATIENT_CLINIC_OR_DEPARTMENT_OTHER): Payer: Self-pay | Admitting: *Deleted

## 2012-08-27 ENCOUNTER — Ambulatory Visit (HOSPITAL_BASED_OUTPATIENT_CLINIC_OR_DEPARTMENT_OTHER): Payer: Medicare Other | Admitting: Anesthesiology

## 2012-08-27 ENCOUNTER — Encounter (HOSPITAL_BASED_OUTPATIENT_CLINIC_OR_DEPARTMENT_OTHER): Payer: Self-pay | Admitting: Anesthesiology

## 2012-08-27 DIAGNOSIS — Y92009 Unspecified place in unspecified non-institutional (private) residence as the place of occurrence of the external cause: Secondary | ICD-10-CM | POA: Insufficient documentation

## 2012-08-27 DIAGNOSIS — W19XXXA Unspecified fall, initial encounter: Secondary | ICD-10-CM | POA: Insufficient documentation

## 2012-08-27 DIAGNOSIS — E119 Type 2 diabetes mellitus without complications: Secondary | ICD-10-CM | POA: Insufficient documentation

## 2012-08-27 DIAGNOSIS — I2510 Atherosclerotic heart disease of native coronary artery without angina pectoris: Secondary | ICD-10-CM | POA: Insufficient documentation

## 2012-08-27 DIAGNOSIS — I10 Essential (primary) hypertension: Secondary | ICD-10-CM | POA: Insufficient documentation

## 2012-08-27 DIAGNOSIS — S62309A Unspecified fracture of unspecified metacarpal bone, initial encounter for closed fracture: Secondary | ICD-10-CM | POA: Insufficient documentation

## 2012-08-27 DIAGNOSIS — K219 Gastro-esophageal reflux disease without esophagitis: Secondary | ICD-10-CM | POA: Insufficient documentation

## 2012-08-27 SURGERY — OPEN REDUCTION INTERNAL FIXATION (ORIF) METACARPAL
Anesthesia: General | Site: Finger | Laterality: Left | Wound class: Clean

## 2012-08-27 MED ORDER — BUPIVACAINE HCL (PF) 0.25 % IJ SOLN
INTRAMUSCULAR | Status: DC | PRN
  Administered 2012-08-27: 5 mL

## 2012-08-27 MED ORDER — OXYCODONE HCL 5 MG/5ML PO SOLN: 5.0000 mg | Freq: Once | ORAL | Status: DC | PRN

## 2012-08-27 MED ORDER — ONDANSETRON HCL 4 MG/2ML IJ SOLN
INTRAMUSCULAR | Status: DC | PRN
  Administered 2012-08-27: 4 mg via INTRAVENOUS

## 2012-08-27 MED ORDER — HYDROCODONE-ACETAMINOPHEN 5-500 MG PO TABS: 1.0000 | ORAL_TABLET | Freq: Four times a day (QID) | ORAL | Status: DC | PRN

## 2012-08-27 MED ORDER — FENTANYL CITRATE 0.05 MG/ML IJ SOLN
INTRAMUSCULAR | Status: DC | PRN
  Administered 2012-08-27: 25 ug via INTRAVENOUS
  Administered 2012-08-27: 50 ug via INTRAVENOUS
  Administered 2012-08-27: 25 ug via INTRAVENOUS

## 2012-08-27 MED ORDER — ONDANSETRON HCL 4 MG/2ML IJ SOLN: 4.0000 mg | Freq: Four times a day (QID) | INTRAMUSCULAR | Status: DC | PRN

## 2012-08-27 MED ORDER — LACTATED RINGERS IV SOLN
INTRAVENOUS | Status: DC
  Administered 2012-08-27: 12:00:00 via INTRAVENOUS

## 2012-08-27 MED ORDER — HYDROCODONE-ACETAMINOPHEN 5-325 MG PO TABS
1.0000 | ORAL_TABLET | Freq: Once | ORAL | Status: AC | PRN
  Administered 2012-08-27: 1 via ORAL

## 2012-08-27 MED ORDER — FENTANYL CITRATE 0.05 MG/ML IJ SOLN
25.0000 ug | INTRAMUSCULAR | Status: DC | PRN
  Administered 2012-08-27 (×2): 25 ug via INTRAVENOUS

## 2012-08-27 MED ORDER — VANCOMYCIN HCL IN DEXTROSE 1-5 GM/200ML-% IV SOLN
1000.0000 mg | INTRAVENOUS | Status: AC
  Administered 2012-08-27: 1000 mg via INTRAVENOUS

## 2012-08-27 MED ORDER — LIDOCAINE HCL (CARDIAC) 20 MG/ML IV SOLN
INTRAVENOUS | Status: DC | PRN
  Administered 2012-08-27: 70 mg via INTRAVENOUS

## 2012-08-27 MED ORDER — FENTANYL CITRATE 0.05 MG/ML IJ SOLN: 50.0000 ug | INTRAMUSCULAR | Status: DC | PRN

## 2012-08-27 MED ORDER — CHLORHEXIDINE GLUCONATE 4 % EX LIQD: 60.0000 mL | Freq: Once | CUTANEOUS | Status: DC

## 2012-08-27 MED ORDER — OXYCODONE HCL 5 MG PO TABS: 5.0000 mg | ORAL_TABLET | Freq: Once | ORAL | Status: DC | PRN

## 2012-08-27 MED ORDER — PROPOFOL 10 MG/ML IV BOLUS
INTRAVENOUS | Status: DC | PRN
  Administered 2012-08-27: 130 mg via INTRAVENOUS

## 2012-08-27 SURGICAL SUPPLY — 55 items
BANDAGE GAUZE ELAST BULKY 4 IN (GAUZE/BANDAGES/DRESSINGS) ×2 IMPLANT
BIT DRILL 1.0 (BIT) ×2
BIT DRILL 1.0X50 (BIT) IMPLANT
BLADE MINI RND TIP GREEN BEAV (BLADE) IMPLANT
BLADE SURG 15 STRL LF DISP TIS (BLADE) ×1 IMPLANT
BLADE SURG 15 STRL SS (BLADE) ×2
BNDG CMPR 9X4 STRL LF SNTH (GAUZE/BANDAGES/DRESSINGS) ×1
BNDG COHESIVE 3X5 TAN STRL LF (GAUZE/BANDAGES/DRESSINGS) ×2 IMPLANT
BNDG ESMARK 4X9 LF (GAUZE/BANDAGES/DRESSINGS) ×2 IMPLANT
CHLORAPREP W/TINT 26ML (MISCELLANEOUS) ×2 IMPLANT
CLOTH BEACON ORANGE TIMEOUT ST (SAFETY) ×2 IMPLANT
CORDS BIPOLAR (ELECTRODE) ×2 IMPLANT
COVER MAYO STAND STRL (DRAPES) ×2 IMPLANT
COVER TABLE BACK 60X90 (DRAPES) ×2 IMPLANT
CUFF TOURNIQUET SINGLE 18IN (TOURNIQUET CUFF) ×1 IMPLANT
DECANTER SPIKE VIAL GLASS SM (MISCELLANEOUS) IMPLANT
DRAPE EXTREMITY T 121X128X90 (DRAPE) ×2 IMPLANT
DRAPE OEC MINIVIEW 54X84 (DRAPES) ×2 IMPLANT
DRAPE SURG 17X23 STRL (DRAPES) ×2 IMPLANT
DRSG KUZMA FLUFF (GAUZE/BANDAGES/DRESSINGS) ×2 IMPLANT
GAUZE SPONGE 4X4 16PLY XRAY LF (GAUZE/BANDAGES/DRESSINGS) IMPLANT
GAUZE XEROFORM 1X8 LF (GAUZE/BANDAGES/DRESSINGS) ×2 IMPLANT
GLOVE BIO SURGEON STRL SZ 6.5 (GLOVE) ×2 IMPLANT
GLOVE BIOGEL PI IND STRL 8 (GLOVE) ×1 IMPLANT
GLOVE BIOGEL PI IND STRL 8.5 (GLOVE) ×1 IMPLANT
GLOVE BIOGEL PI INDICATOR 8 (GLOVE) ×1
GLOVE BIOGEL PI INDICATOR 8.5 (GLOVE) ×1
GLOVE INDICATOR 7.0 STRL GRN (GLOVE) ×1 IMPLANT
GLOVE SURG ORTHO 8.0 STRL STRW (GLOVE) ×2 IMPLANT
GOWN BRE IMP PREV XXLGXLNG (GOWN DISPOSABLE) ×2 IMPLANT
GOWN PREVENTION PLUS XLARGE (GOWN DISPOSABLE) ×2 IMPLANT
NEEDLE 27GAX1X1/2 (NEEDLE) ×1 IMPLANT
NS IRRIG 1000ML POUR BTL (IV SOLUTION) ×2 IMPLANT
PACK BASIN DAY SURGERY FS (CUSTOM PROCEDURE TRAY) ×2 IMPLANT
PAD CAST 3X4 CTTN HI CHSV (CAST SUPPLIES) ×1 IMPLANT
PADDING CAST ABS 4INX4YD NS (CAST SUPPLIES)
PADDING CAST ABS COTTON 4X4 ST (CAST SUPPLIES) ×1 IMPLANT
PADDING CAST COTTON 3X4 STRL (CAST SUPPLIES) ×2
SCREW SELF TAP CORTEX 1.3 10MM (Screw) ×1 IMPLANT
SCREW SELF TAP CORTEX 1.3 12MM (Screw) ×2 IMPLANT
SLEEVE SCD COMPRESS KNEE MED (MISCELLANEOUS) ×1 IMPLANT
SPLINT PLASTER CAST XFAST 3X15 (CAST SUPPLIES) IMPLANT
SPLINT PLASTER XTRA FASTSET 3X (CAST SUPPLIES)
SPONGE GAUZE 4X4 12PLY (GAUZE/BANDAGES/DRESSINGS) ×2 IMPLANT
STOCKINETTE 4X48 STRL (DRAPES) ×2 IMPLANT
SUT CHROMIC 5 0 P 3 (SUTURE) IMPLANT
SUT MERSILENE 4 0 P 3 (SUTURE) IMPLANT
SUT VICRYL 4-0 PS2 18IN ABS (SUTURE) IMPLANT
SUT VICRYL RAPID 5 0 P 3 (SUTURE) IMPLANT
SUT VICRYL RAPIDE 4/0 PS 2 (SUTURE) ×2 IMPLANT
SYR BULB 3OZ (MISCELLANEOUS) ×2 IMPLANT
SYR CONTROL 10ML LL (SYRINGE) ×1 IMPLANT
TOWEL OR 17X24 6PK STRL BLUE (TOWEL DISPOSABLE) ×2 IMPLANT
UNDERPAD 30X30 INCONTINENT (UNDERPADS AND DIAPERS) ×2 IMPLANT
WATER STERILE IRR 1000ML POUR (IV SOLUTION) ×1 IMPLANT

## 2012-08-27 NOTE — Anesthesia Preprocedure Evaluation (Addendum)
Anesthesia Evaluation  Patient identified by MRN, date of birth, ID band Patient awake    Reviewed: Allergy & Precautions, H&P , NPO status , Patient's Chart, lab work & pertinent test results  Airway Mallampati: II  Neck ROM: full    Dental   Pulmonary shortness of breath,          Cardiovascular hypertension, + CAD and + CABG     Neuro/Psych Anxiety  Neuromuscular disease    GI/Hepatic hiatal hernia, GERD-  ,  Endo/Other  diabetes, Type 2  Renal/GU      Musculoskeletal   Abdominal   Peds  Hematology   Anesthesia Other Findings   Reproductive/Obstetrics                           Anesthesia Physical Anesthesia Plan  ASA: III  Anesthesia Plan: General and Regional   Post-op Pain Management: MAC Combined w/ Regional for Post-op pain   Induction: Intravenous  Airway Management Planned: LMA  Additional Equipment:   Intra-op Plan:   Post-operative Plan:   Informed Consent: I have reviewed the patients History and Physical, chart, labs and discussed the procedure including the risks, benefits and alternatives for the proposed anesthesia with the patient or authorized representative who has indicated his/her understanding and acceptance.     Plan Discussed with: CRNA and Surgeon  Anesthesia Plan Comments:         Anesthesia Quick Evaluation

## 2012-08-27 NOTE — Anesthesia Procedure Notes (Signed)
Procedure Name: LMA Insertion Date/Time: 08/27/2012 12:07 PM Performed by: Meyer Russel Pre-anesthesia Checklist: Patient identified, Emergency Drugs available, Suction available and Patient being monitored Patient Re-evaluated:Patient Re-evaluated prior to inductionOxygen Delivery Method: Circle System Utilized Preoxygenation: Pre-oxygenation with 100% oxygen Intubation Type: IV induction Ventilation: Mask ventilation without difficulty LMA: LMA inserted LMA Size: 4.0 Number of attempts: 1 Airway Equipment and Method: bite block Placement Confirmation: positive ETCO2 and breath sounds checked- equal and bilateral Tube secured with: Tape Dental Injury: Teeth and Oropharynx as per pre-operative assessment

## 2012-08-27 NOTE — Op Note (Signed)
Dictation Number [id number]

## 2012-08-27 NOTE — Progress Notes (Signed)
Report called to [facility] where the pt resides. Spoke to [employer] LPN [phone]. Reviewed all discharge instructions, new script, and time of last oral pain medication. No further questions. Copy of all reviewed instruction will be sent with the pt.

## 2012-08-27 NOTE — Transfer of Care (Signed)
Immediate Anesthesia Transfer of Care Note  Patient: [name]  Procedure(s) Performed: Procedure(s) (LRB) with comments: OPEN REDUCTION INTERNAL FIXATION (ORIF) METACARPAL (Left)  Patient Location: PACU  Anesthesia Type: General  Level of Consciousness: awake, alert  and oriented  Airway & Oxygen Therapy: Patient Spontanous Breathing and Patient connected to face mask oxygen  Post-op Assessment: Report given to PACU RN, Post -op Vital signs reviewed and stable and Patient moving all extremities  Post vital signs: Reviewed and stable  Complications: No apparent anesthesia complications

## 2012-08-27 NOTE — Brief Op Note (Signed)
08/27/2012  12:56 PM  PATIENT:  [name]  78 y.o. female  PRE-OPERATIVE DIAGNOSIS:  fracture left ring/little metacarpal   POST-OPERATIVE DIAGNOSIS:  fracture left ring/little metacarpa  PROCEDURE:  Procedure(s) (LRB) with comments: OPEN REDUCTION INTERNAL FIXATION (ORIF) METACARPAL (Left)  SURGEON:  Surgeon(s) and Role:    * Nicki Reaper, MD - Primary  PHYSICIAN ASSISTANT:   ASSISTANTS: none   ANESTHESIA:   local and general  EBL:  Total I/O In: 500 [I.V.:500] Out: -   BLOOD ADMINISTERED:none  DRAINS: none   LOCAL MEDICATIONS USED:  MARCAINE     SPECIMEN:  No Specimen  DISPOSITION OF SPECIMEN:  N/A  COUNTS:  YES  TOURNIQUET:   Total Tourniquet Time Documented: Upper Arm (Left) - 35 minutes  DICTATION: .Other Dictation: Dictation Number [id number]  PLAN OF CARE: Discharge to home after PACU  PATIENT DISPOSITION:  PACU - hemodynamically stable.

## 2012-08-27 NOTE — Anesthesia Postprocedure Evaluation (Signed)
Anesthesia Post Note  Patient: [name]  Procedure(s) Performed: Procedure(s) (LRB): OPEN REDUCTION INTERNAL FIXATION (ORIF) METACARPAL (Left)  Anesthesia type: General  Patient location: PACU  Post pain: Pain level controlled and Adequate analgesia  Post assessment: Post-op Vital signs reviewed, Patient's Cardiovascular Status Stable, Respiratory Function Stable, Patent Airway and Pain level controlled  Last Vitals:  Filed Vitals:   08/27/12 1330  BP: 158/62  Pulse: 58  Temp:   Resp: 12    Post vital signs: Reviewed and stable  Level of consciousness: awake, alert  and oriented  Complications: No apparent anesthesia complications

## 2012-08-27 NOTE — H&P (Signed)
[name] is an 78 year old right hand dominant female referred by Dr. Kriste Basque for a consultation with respect to a fall which occurred 2-3 days ago onto her left hand. She complains of pain on the ulnar aspect of her hand. She had x-rays taken at Dr. Jodelle Green office revealing a fracture of her 4th and 5th metacarpal. She has a prior history of multiple fractures in the past treated by Dr. Mina Marble. She complains of an aching type pain, moderate in nature with any use.   PAST MEDICAL HISTORY: She has no allergies. She is on the following medications: Clindamycin, Ibandronate, Amlodipine, Bumetanide, Clopidogrel, Metoprolol, Glimepiride, Metformin, Omeprazole, Losartan potassium, Quetiapine fumarate, Clonazepam, and Tylenol. She is not presently taking any anti-coagulants. She has had a fracture of her olecranon fixed, carpal tunnel release.  FAMILY H ISTORY: Unknown.  SOCIAL HISTORY: She does not smoke or drink.   REVIEW OF SYSTEMS: Positive for glasses, bronchitis, pulmonary embolus, subclavian arterial stenosis, hyperlipidemia, degenerative arthritis. [name] is an 78 y.o. female.   Chief Complaint: fracture metacarpals ring and littleleft HPI: see above  Past Medical History  Diagnosis Date  . Bronchitis, mucopurulent recurrent   . Hematoma     Large hematoma from fall, November, 2009, Coumadin stopped  . Pulmonary embolism   . Hypertension   . CAD (coronary artery disease)     2008, question slight anteroapical ischemia  . Subclavian arterial stenosis     PCI  . Hyperlipidemia   . Diabetes mellitus   . GERD (gastroesophageal reflux disease)   . IBS (irritable bowel syndrome)   . Nephrolithiasis   . DJD (degenerative joint disease)   . Right knee injury   . Osteoporosis   . Senile dementia with delusional features   . Anxiety   . Palpitations   . Hiatal hernia   . Hemorrhoids   . Obesity   . SOB (shortness of breath)   . Esophageal stricture   . Ejection fraction      EF 70%, nuclear, 2007  . Hx of CABG     2003, ... LIMA not used... small caliber  . Carotid artery disease     Doppler, December, 2007, bilateral 0-39%  . Aspirin intolerance   . Dementia     Senile dementia with delusional features  . Warfarin anticoagulation     stopped 09/2008 with large hematoma from fall    Past Surgical History  Procedure Date  . Appendectomy   . Cataract extraction   . Hemorrhoid surgery   . Abdominal hysterectomy   . Cabg  1 vessel w/svg to lad 2003  . Mult orthopedic procedures     Dr. Arnette Norris arm, CTS,cyst removal  . Open heart surgery     Family History  Problem Relation Age of Onset  . Stroke Father   . Cancer Mother     bladder cancer  . Hypertension Mother   . Stroke Mother   . Leukemia Sister   . Colon cancer Neg Hx    Social History:  reports that she has never smoked. She does not have any smokeless tobacco history on file. She reports that she does not drink alcohol or use illicit drugs.  Allergies:  Allergies  Allergen Reactions  . Aspirin     REACTION: pt states GI INTOL to aspirin  . Iodine     REACTION: GI UPSET  . Irbesartan-Hydrochlorothiazide     REACTION: GI UPSET  . Penicillins     REACTION: GI UPSET  Medications Prior to Admission  Medication Sig Dispense Refill  . amLODipine-atorvastatin (CADUET) 5-20 MG per tablet Take 1 tablet by mouth daily.  30 tablet  11  . bumetanide (BUMEX) 0.5 MG tablet Take 1 tablet (0.5 mg total) by mouth daily.  30 tablet  11  . clopidogrel (PLAVIX) 75 MG tablet TAKE 1 TABLET BY MOUTH EVERY DAY  30 tablet  6  . clorazepate (TRANXENE) 7.5 MG tablet Take 7.5 mg by mouth 3 (three) times daily as needed.      Marland Kitchen glimepiride (AMARYL) 1 MG tablet Take 1/2 tablet by mouth once daily  30 tablet  5  . ibandronate (BONIVA) 150 MG tablet Take 1 tablet (150 mg total) by mouth every 30 (thirty) days. Take in the morning with a full glass of water, on an empty stomach, and do not take  anything else by mouth or lie down for the next 30 min.  1 tablet  11  . losartan (COZAAR) 50 MG tablet Take 1 tablet (50 mg total) by mouth daily.  30 tablet  11  . metFORMIN (GLUCOPHAGE) 500 MG tablet Take 1 tablet (500 mg total) by mouth daily with breakfast.  30 tablet  5  . metoprolol succinate (TOPROL-XL) 25 MG 24 hr tablet Take 25 mg by mouth daily.      Marland Kitchen omeprazole (PRILOSEC) 20 MG capsule Take 1 capsule (20 mg total) by mouth daily.  30 capsule  11  . QUEtiapine (SEROQUEL) 25 MG tablet Take 12.5 mg by mouth at bedtime.      Marland Kitchen acetaminophen (TYLENOL) 325 MG tablet Take 650 mg by mouth every 6 (six) hours as needed.      . clindamycin (CLEOCIN) 150 MG capsule Take 4 caps by mouth 1 hour prior to dental appt        No results found for this or any previous visit (from the past 48 hour(s)).  No results found.   Pertinent items are noted in HPI.  Blood pressure 143/54, pulse 60, temperature 99.2 F (37.3 C), temperature source Oral, resp. rate 18, height 5\' 2"  (1.575 m), weight 75.978 kg (167 lb 8 oz), SpO2 96.00%.  General appearance: alert, cooperative and appears stated age Head: Normocephalic, without obvious abnormality Neck: no adenopathy Resp: clear to auscultation bilaterally Cardio: regular rate and rhythm, S1, S2 normal, no murmur, click, rub or gallop GI: soft, non-tender; bowel sounds normal; no masses,  no organomegaly Extremities: extremities normal, atraumatic, no cyanosis or edema Pulses: 2+ and symmetric Skin: Skin color, texture, turgor normal. No rashes or lesions Neurologic: Grossly normal Incision/Wound: na  Assessment/Plan X-rays reveal an oblique fracture of her metacarpal little finger and a fracture of the neck of her ring finger with early displacement.   We would recommend fixation open of her little finger to stabilize the ring with possible open reduction or pinning of the ring finger  Jaren Vanetten R 08/27/2012, 11:32 AM

## 2012-08-28 LAB — POCT I-STAT, CHEM 8
BUN: 11 mg/dL (ref 6–23)
Calcium, Ion: 1.25 mmol/L (ref 1.13–1.30)
Chloride: 106 mEq/L (ref 96–112)
Glucose, Bld: 129 mg/dL — ABNORMAL HIGH (ref 70–99)
TCO2: 25 mmol/L (ref 0–100)

## 2012-08-28 NOTE — Op Note (Signed)
[name], [name]               ACCOUNT NO.:  [id number]  MEDICAL RECORD NO.:  [id number]  LOCATION:                                 FACILITY:  PHYSICIAN:  Cindee Salt, M.D.            DATE OF BIRTH:  DATE OF PROCEDURE:  08/27/2012 DATE OF DISCHARGE:                              OPERATIVE REPORT   PREOPERATIVE DIAGNOSIS:  Fracture ring and little finger metacarpals, left hand.  POSTOPERATIVE DIAGNOSIS:  Fracture ring and little finger metacarpals, left hand.  OPERATION:  Open reduction and internal fixation, left little finger metacarpal fracture.  SURGEON:  Cindee Salt, M.D.  ANESTHESIA:  General with local infiltration.  HISTORY:  The patient is an 78 year old female who suffered a fall with fracture of her ring and little finger metacarpal.  She has an oblique fracture of her little finger metacarpal.  She has a nondisplaced fracture of her ring finger metacarpal.  She is admitted for open reduction and internal fixation, left little finger metacarpal with possible pinning of ring finger, possible open reduction and internal fixation ring finger metacarpal.  She is aware of risks and complications including infection, recurrence of injury to arteries, nerves, tendons, incomplete relief of symptoms, dystrophy.  In the preoperative area, the patient is seen, the extremity marked by both the patient and surgeon.  Antibiotic given.  PROCEDURE:  The patient was brought to the operating room where general anesthetic was undertaken under the direction of Dr. Chaney Malling.  She was prepped using ChloraPrep, supine position, left arm free.  A 3-minute dry time was allowed.  Time-out taken, confirming the patient and procedure.  The limb was exsanguinated with an Esmarch bandage. Tourniquet placed on the upper arm was inflated to 250 mmHg.  A curvilinear incision was made over the little finger metacarpal, carried down through subcutaneous tissue.  Bleeders were electrocauterized  with bipolar.  The dissection carried to the ulnar side of the extensor tendon.  The periosteum was incised.  The long oblique fracture was immediately apparent.  This was beginning to heal, appeared to be older than her report of fall.  This was mobilized, reduced, and clamped.  X- rays confirmed positioning with full flexion/extension.  There was no angulatory or overlap rotatory deformity.  A 1.3 modular handset was then selected with the clamp in place.  Drill holes were placed for 3 oblique screws.  It was noted that her bone was extremely soft.  Three 1.3 modular screws were placed fixing the fracture internally with good stability.  These measured 10 and two 12 mm.  X-rays confirmed positioning of the fracture in good position.  It was felt that the ring finger was stable having stabilized the little finger, and we would allow this to heal without pinning.  The wound was copiously irrigated with saline.  The periosteum was then closed with a running 5-0 chromic sutures.  The extensor hood which was incised was repaired with figure- of-eight 4-0 Vicryl sutures, subcutaneous 4-0 Vicryl suture was placed interrupted nature and an interrupted 4-0 Vicryl Rapide were used to close the skin.  Local infiltration with 0.25% Marcaine without epinephrine was given, 5 mL  was used.  Sterile compressive dressing and splint to the middle ring and little finger dorsal palmar in nature was applied.  On deflation of the tourniquet, all fingers immediately pinked.  She was taken to the recovery room for observation.  She will be discharged on Vicodin.          ______________________________ Cindee Salt, M.D.     GK/MEDQ  D:  08/27/2012  T:  08/28/2012  Job:  [id number]

## 2012-09-02 ENCOUNTER — Encounter (HOSPITAL_BASED_OUTPATIENT_CLINIC_OR_DEPARTMENT_OTHER): Payer: Self-pay

## 2012-09-24 ENCOUNTER — Telehealth: Payer: Self-pay | Admitting: Pulmonary Disease

## 2012-09-24 MED ORDER — HYDROCODONE-ACETAMINOPHEN 5-500 MG PO TABS: 1.0000 | ORAL_TABLET | Freq: Three times a day (TID) | ORAL | Status: DC | PRN

## 2012-09-25 NOTE — Telephone Encounter (Signed)
This rx has been faxed.  

## 2012-10-06 ENCOUNTER — Telehealth: Payer: Self-pay | Admitting: Pulmonary Disease

## 2012-10-06 NOTE — Telephone Encounter (Signed)
Per Marliss Czar she will write tihs on RX and will fax to the number provided. Nothing further is needed.

## 2012-11-07 ENCOUNTER — Other Ambulatory Visit: Payer: Self-pay | Admitting: *Deleted

## 2012-11-07 MED ORDER — LOSARTAN POTASSIUM 50 MG PO TABS: 50.0000 mg | ORAL_TABLET | Freq: Every day | ORAL | Status: DC

## 2012-11-21 ENCOUNTER — Encounter: Payer: Self-pay | Admitting: Pulmonary Disease

## 2012-11-26 ENCOUNTER — Other Ambulatory Visit: Payer: Self-pay | Admitting: Pulmonary Disease

## 2012-11-26 MED ORDER — QUETIAPINE FUMARATE 25 MG PO TABS: 12.5000 mg | ORAL_TABLET | Freq: Every day | ORAL | Status: DC

## 2012-11-26 NOTE — Telephone Encounter (Signed)
Refill sent for the seroquel.  Pt has appt with SN in march

## 2012-12-10 ENCOUNTER — Telehealth: Payer: Self-pay | Admitting: Pulmonary Disease

## 2012-12-11 NOTE — Telephone Encounter (Signed)
Forms have been completed by SN and i have called and steve will come by to pick these up.  Nothing further is needed.

## 2012-12-23 ENCOUNTER — Telehealth: Payer: Self-pay | Admitting: Pulmonary Disease

## 2012-12-23 NOTE — Telephone Encounter (Signed)
Form has been faxed back to facility and nothing further is needed.

## 2012-12-23 NOTE — Telephone Encounter (Signed)
[name], please advise the status of the form, thanks!

## 2012-12-25 ENCOUNTER — Other Ambulatory Visit: Payer: Self-pay | Admitting: *Deleted

## 2012-12-25 ENCOUNTER — Telehealth: Payer: Self-pay | Admitting: Pulmonary Disease

## 2012-12-25 MED ORDER — QUETIAPINE FUMARATE 25 MG PO TABS: 12.5000 mg | ORAL_TABLET | Freq: Every day | ORAL | Status: AC

## 2012-12-25 NOTE — Telephone Encounter (Signed)
Form was signed by SN and faxed back on 12/23/2012.  Form has been refaxed to [phone]

## 2013-01-10 ENCOUNTER — Emergency Department (HOSPITAL_COMMUNITY)
Admission: EM | Admit: 2013-01-10 | Discharge: 2013-01-10 | Disposition: A | Discharge status: Home / Self Care | Payer: Medicare Other | Attending: Emergency Medicine | Admitting: Emergency Medicine

## 2013-01-10 ENCOUNTER — Emergency Department (HOSPITAL_COMMUNITY): Payer: Medicare Other

## 2013-01-10 ENCOUNTER — Encounter (HOSPITAL_COMMUNITY): Payer: Self-pay | Admitting: Emergency Medicine

## 2013-01-10 DIAGNOSIS — Z86718 Personal history of other venous thrombosis and embolism: Secondary | ICD-10-CM | POA: Insufficient documentation

## 2013-01-10 DIAGNOSIS — K219 Gastro-esophageal reflux disease without esophagitis: Secondary | ICD-10-CM | POA: Insufficient documentation

## 2013-01-10 DIAGNOSIS — Z8719 Personal history of other diseases of the digestive system: Secondary | ICD-10-CM | POA: Insufficient documentation

## 2013-01-10 DIAGNOSIS — W19XXXA Unspecified fall, initial encounter: Secondary | ICD-10-CM

## 2013-01-10 DIAGNOSIS — F411 Generalized anxiety disorder: Secondary | ICD-10-CM | POA: Insufficient documentation

## 2013-01-10 DIAGNOSIS — Z7901 Long term (current) use of anticoagulants: Secondary | ICD-10-CM | POA: Insufficient documentation

## 2013-01-10 DIAGNOSIS — I10 Essential (primary) hypertension: Secondary | ICD-10-CM | POA: Insufficient documentation

## 2013-01-10 DIAGNOSIS — E785 Hyperlipidemia, unspecified: Secondary | ICD-10-CM | POA: Insufficient documentation

## 2013-01-10 DIAGNOSIS — Z79899 Other long term (current) drug therapy: Secondary | ICD-10-CM | POA: Insufficient documentation

## 2013-01-10 DIAGNOSIS — I2510 Atherosclerotic heart disease of native coronary artery without angina pectoris: Secondary | ICD-10-CM | POA: Insufficient documentation

## 2013-01-10 DIAGNOSIS — Y939 Activity, unspecified: Secondary | ICD-10-CM | POA: Insufficient documentation

## 2013-01-10 DIAGNOSIS — Z951 Presence of aortocoronary bypass graft: Secondary | ICD-10-CM | POA: Insufficient documentation

## 2013-01-10 DIAGNOSIS — Z8679 Personal history of other diseases of the circulatory system: Secondary | ICD-10-CM | POA: Insufficient documentation

## 2013-01-10 DIAGNOSIS — Z8739 Personal history of other diseases of the musculoskeletal system and connective tissue: Secondary | ICD-10-CM | POA: Insufficient documentation

## 2013-01-10 DIAGNOSIS — S60219A Contusion of unspecified wrist, initial encounter: Secondary | ICD-10-CM | POA: Insufficient documentation

## 2013-01-10 DIAGNOSIS — Y9210 Unspecified residential institution as the place of occurrence of the external cause: Secondary | ICD-10-CM | POA: Insufficient documentation

## 2013-01-10 DIAGNOSIS — W010XXA Fall on same level from slipping, tripping and stumbling without subsequent striking against object, initial encounter: Secondary | ICD-10-CM | POA: Insufficient documentation

## 2013-01-10 DIAGNOSIS — Z8709 Personal history of other diseases of the respiratory system: Secondary | ICD-10-CM | POA: Insufficient documentation

## 2013-01-10 DIAGNOSIS — Z87448 Personal history of other diseases of urinary system: Secondary | ICD-10-CM | POA: Insufficient documentation

## 2013-01-10 DIAGNOSIS — F0392 Unspecified dementia, unspecified severity, with psychotic disturbance: Secondary | ICD-10-CM | POA: Insufficient documentation

## 2013-01-10 DIAGNOSIS — Z87828 Personal history of other (healed) physical injury and trauma: Secondary | ICD-10-CM | POA: Insufficient documentation

## 2013-01-10 DIAGNOSIS — E669 Obesity, unspecified: Secondary | ICD-10-CM | POA: Insufficient documentation

## 2013-01-10 DIAGNOSIS — E119 Type 2 diabetes mellitus without complications: Secondary | ICD-10-CM | POA: Insufficient documentation

## 2013-01-10 DIAGNOSIS — T148XXA Other injury of unspecified body region, initial encounter: Secondary | ICD-10-CM

## 2013-01-10 DIAGNOSIS — IMO0002 Reserved for concepts with insufficient information to code with codable children: Secondary | ICD-10-CM | POA: Insufficient documentation

## 2013-01-10 NOTE — ED Notes (Addendum)
Discharge instructions reviewed with brother. Brother verbalized understanding.

## 2013-01-10 NOTE — ED Notes (Signed)
Dr. Belfi at bedside 

## 2013-01-10 NOTE — ED Notes (Signed)
Patient tripped on rug fell forward and developed pain right wrist pain swelling and light blue ecchymosis radial pulses +2 full sensation able to move all fingers. States lower middle back soreness. Pain 2/10 moves all extremities bilateral equal and moderate strength.

## 2013-01-10 NOTE — ED Notes (Signed)
Abigail PA at bedside   

## 2013-01-10 NOTE — ED Provider Notes (Signed)
History     CSN: [id number]  Arrival date & time 01/10/13  1529   First MD Initiated Contact with Patient 01/10/13 2231      Chief Complaint  Patient presents with  . Fall  . Wrist Pain    (Consider location/radiation/quality/duration/timing/severity/associated sxs/prior treatment) The history is provided by the patient and the spouse. The history is limited by the condition of the patient (Dementia).    79 year old female presents emergency department for fall and wrist pain.  Patient was at her assisted living facility today when she tripped over the carpet and fell onto her outstretched hands.  She complains of right wrist pain.  She also complains of back pain.  The patient has some didn't decline dementia with short-term memory loss and some of the history is given by her husband. She denies hitting her head or losing consciousness.  She denies any numbness or tingling in her hands.  She denies any difficulty walking.  Patient is ambulatory at the hospital .  Past Medical History  Diagnosis Date  . Bronchitis, mucopurulent recurrent   . Hematoma     Large hematoma from fall, November, 2009, Coumadin stopped  . Pulmonary embolism   . Hypertension   . CAD (coronary artery disease)     2008, question slight anteroapical ischemia  . Subclavian arterial stenosis     PCI  . Hyperlipidemia   . Diabetes mellitus   . GERD (gastroesophageal reflux disease)   . IBS (irritable bowel syndrome)   . Nephrolithiasis   . DJD (degenerative joint disease)   . Right knee injury   . Osteoporosis   . Senile dementia with delusional features   . Anxiety   . Palpitations   . Hiatal hernia   . Hemorrhoids   . Obesity   . SOB (shortness of breath)   . Esophageal stricture   . Ejection fraction     EF 70%, nuclear, 2007  . Hx of CABG     2003, ... LIMA not used... small caliber  . Carotid artery disease     Doppler, December, 2007, bilateral 0-39%  . Aspirin intolerance   . Dementia      Senile dementia with delusional features  . Warfarin anticoagulation     stopped 09/2008 with large hematoma from fall    Past Surgical History  Procedure Laterality Date  . Appendectomy    . Cataract extraction    . Hemorrhoid surgery    . Abdominal hysterectomy    . Cabg  1 vessel w/svg to lad  2003  . Mult orthopedic procedures      Dr. Arnette Norris arm, CTS,cyst removal  . Open heart surgery      Family History  Problem Relation Age of Onset  . Stroke Father   . Cancer Mother     bladder cancer  . Hypertension Mother   . Stroke Mother   . Leukemia Sister   . Colon cancer Neg Hx     History  Substance Use Topics  . Smoking status: Never Smoker   . Smokeless tobacco: Not on file  . Alcohol Use: No    OB History   Grav Para Term Preterm Abortions TAB SAB Ect Mult Living                  Review of Systems Ten systems reviewed and are negative for acute change, except as noted in the HPI.    Allergies  Aspirin; Iodine; Irbesartan-hydrochlorothiazide; and Penicillins  Home  Medications   Current Outpatient Rx  Name  Route  Sig  Dispense  Refill  . acetaminophen (TYLENOL) 325 MG tablet   Oral   Take 650 mg by mouth every 6 (six) hours as needed.          Marland Kitchen amLODipine-atorvastatin (CADUET) 5-20 MG per tablet   Oral   Take 1 tablet by mouth daily.   30 tablet   11   . bumetanide (BUMEX) 0.5 MG tablet   Oral   Take 1 tablet (0.5 mg total) by mouth daily.   30 tablet   11   . clindamycin (CLEOCIN) 150 MG capsule      Take 4 caps by mouth 1 hour prior to dental appt         . clopidogrel (PLAVIX) 75 MG tablet      TAKE 1 TABLET BY MOUTH EVERY DAY   30 tablet   6   . clorazepate (TRANXENE) 7.5 MG tablet   Oral   Take 7.5 mg by mouth 3 (three) times daily as needed.          Marland Kitchen glimepiride (AMARYL) 1 MG tablet      Take 1/2 tablet by mouth once daily   30 tablet   5   . HYDROcodone-acetaminophen (VICODIN) 5-500 MG per tablet    Oral   Take 1 tablet by mouth 3 (three) times daily as needed for pain.   90 tablet   5   . ibandronate (BONIVA) 150 MG tablet   Oral   Take 1 tablet (150 mg total) by mouth every 30 (thirty) days. Take in the morning with a full glass of water, on an empty stomach, and do not take anything else by mouth or lie down for the next 30 min.   1 tablet   11   . losartan (COZAAR) 50 MG tablet   Oral   Take 1 tablet (50 mg total) by mouth daily.   30 tablet   3   . metFORMIN (GLUCOPHAGE) 500 MG tablet   Oral   Take 1 tablet (500 mg total) by mouth daily with breakfast.   30 tablet   5   . metoprolol succinate (TOPROL-XL) 25 MG 24 hr tablet   Oral   Take 25 mg by mouth daily.         Marland Kitchen omeprazole (PRILOSEC) 20 MG capsule   Oral   Take 1 capsule (20 mg total) by mouth daily.   30 capsule   11     Pt needs follow up with Dr. Kriste Basque for refills   . QUEtiapine (SEROQUEL) 25 MG tablet   Oral   Take 0.5 tablets (12.5 mg total) by mouth at bedtime.   15 tablet   3     BP 145/84  Pulse 82  Temp(Src) 98.1 F (36.7 C) (Oral)  Resp 18  SpO2 96%  Physical Exam  Nursing note and vitals reviewed. Constitutional: She is oriented to person, place, and time.  Pleasant and friendly 79 year old female appearing younger than stated age in no acute distress.  HENT:  Head: Normocephalic and atraumatic.  No signs of head trauma.  Eyes: Conjunctivae and EOM are normal. Pupils are equal, round, and reactive to light.  Neck: Normal range of motion. Neck supple. No JVD present. No tracheal deviation present.  No spinous process tenderness  Cardiovascular: Normal rate, regular rhythm and normal heart sounds.   Pulmonary/Chest: Effort normal and breath sounds normal. No respiratory distress.  Abdominal: Soft. Bowel sounds are normal. She exhibits no distension and no mass. There is no tenderness. There is no guarding.  Lymphadenopathy:    She has no cervical adenopathy.  Neurological:  She is alert and oriented to person, place, and time. No cranial nerve deficit. Coordination normal.  Patient was some baseline dementia and confusion she is alert and oriented.  Speech is clear and goal oriented, follows commands Major Cranial nerves without deficit, no facial droop Normal strength in upper and lower extremities bilaterally including dorsiflexion and plantar flexion, strong and equal grip strength Sensation normal to light and sharp touch Moves extremities without ataxia, coordination intact Normal finger to nose and rapid alternating movements Neg romberg, no pronator drift Normal gait Normal heel-shin and balance  .    ED Course  Procedures (including critical care time)  Labs Reviewed - No data to display Dg Lumbar Spine Complete  01/10/2013  *RADIOLOGY REPORT*  Clinical Data: Status post fall; lower back pain.  LUMBAR SPINE - COMPLETE 4+ VIEW  Comparison: CT of the abdomen and pelvis performed 08/14/2004  Findings: There is no evidence of fracture or subluxation. Vertebral bodies demonstrate normal height and alignment. Intervertebral disc spaces are grossly preserved.  The visualized bowel gas pattern is unremarkable in appearance; air and stool are noted within the colon.  The sacroiliac joints are within normal limits.  Clips are noted within the right upper quadrant, reflecting prior cholecystectomy.  Diffuse vascular calcifications are seen.  IMPRESSION:  1.  No evidence of fracture or subluxation along the lumbar spine. 2.  Diffuse vascular calcifications seen.   Original Report Authenticated By: Tonia Ghent, M.D.    Dg Wrist Complete Right  01/10/2013  *RADIOLOGY REPORT*  Clinical Data: Fall.  Pain  RIGHT WRIST - COMPLETE 3+ VIEW  Comparison: None.  Findings: Normal alignment and no fracture.  No significant joint space narrowing or erosion. Soft tissue swelling.  There is displacement of  the pronator fat pad compatible with effusion.  IMPRESSION:   Wrist  effusion without fracture.   Original Report Authenticated By: Janeece Riggers, M.D.      1. Hematoma   2. Fall, initial encounter       MDM  10:56 PM BP 145/84  Pulse 82  Temp(Src) 98.1 F (36.7 C) (Oral)  Resp 18  SpO2 96% Patient with fall on outstretched hand.  She does have a hematoma over the right distal ulna.  There is no evidence of fracture or dislocation in her x-rays.  No bony tenderness.  Do not feel the patient needs a CT scan of her HEENT until trauma and she has a normal neurologic exam.    Filed Vitals:   01/10/13 1630 01/10/13 2000  BP: 116/89 145/84  Pulse: 86 82  Temp: 98.1 F (36.7 C)   TempSrc: Oral   Resp: 18 18  SpO2: 96%    Patient's imaging negative for any acute fractures or dislocations.  Old apply Ace wrap to the patient's wrist to provide some compression to her swelling and bleeding.  Patient is instructed to followup with her primary care physician. At this time there does not appear to be any evidence of an acute emergency medical condition and the patient appears stable for discharge with appropriate outpatient follow up.Diagnosis was discussed with patient who verbalizes understanding and is agreeable to discharge. Pt case discussed with Dr. Fredderick Phenix who agrees with my plan.     Arthor Captain, PA-C 01/14/13 2030

## 2013-01-12 ENCOUNTER — Telehealth: Payer: Self-pay | Admitting: Pulmonary Disease

## 2013-01-12 NOTE — ED Notes (Signed)
POA called to see if patient's jewelry was taken off while in hospital. No documentation found concerning jewelry .

## 2013-01-12 NOTE — Telephone Encounter (Signed)
I spoke with [name] and VO for PT. Nothing further was needed

## 2013-01-12 NOTE — Telephone Encounter (Signed)
Spoke with Charolette She states pt had a fall 2 days ago, nothing broken but she is sore Nurse asking for VO to provide pt with PT Please advise if this is okay thanks!

## 2013-01-12 NOTE — Telephone Encounter (Signed)
Per SN---ok for PT for this pt.  thanks

## 2013-01-14 ENCOUNTER — Telehealth: Payer: Self-pay | Admitting: Pulmonary Disease

## 2013-01-14 NOTE — Telephone Encounter (Signed)
[email or web address] for [name]

## 2013-01-15 NOTE — Telephone Encounter (Signed)
I spoke with [name], physical therapists, and he states he needs a verbal ok to see the pt 3 times a week for 1 week, and then 2 times a week for 2 days. I gave the verbal ok per SN. Carron Curie, CMA

## 2013-01-16 NOTE — ED Provider Notes (Signed)
Medical screening examination/treatment/procedure(s) were conducted as a shared visit with non-physician practitioner(s) and myself.  I personally evaluated the patient during the encounter   Rolan Bucco, MD 01/16/13 1058

## 2013-01-29 ENCOUNTER — Telehealth: Payer: Self-pay | Admitting: Pulmonary Disease

## 2013-01-29 NOTE — Telephone Encounter (Signed)
Form has been placed on SN cart to be signed.  Will fax this back once completed by SN.

## 2013-01-29 NOTE — Telephone Encounter (Signed)
[name] have you seen any paperwork for diabetic supplies for this pt? Please advise. Carron Curie, CMA

## 2013-01-29 NOTE — Telephone Encounter (Signed)
I called and spoke with [name]. Advised her of this. Will forward to leigh to f/u on

## 2013-02-02 NOTE — Telephone Encounter (Signed)
[name], please advise of the status of this message. Thanks.

## 2013-02-03 NOTE — Telephone Encounter (Signed)
Spoke with Tinnie Gens at [facility] and he verified that they did receive the orders for diabetic supplies.

## 2013-02-17 ENCOUNTER — Other Ambulatory Visit: Payer: Self-pay | Admitting: Pulmonary Disease

## 2013-02-17 MED ORDER — BUMETANIDE 0.5 MG PO TABS: 0.5000 mg | ORAL_TABLET | Freq: Every day | ORAL | Status: DC

## 2013-02-17 NOTE — Telephone Encounter (Signed)
Pharmacy consultants  Bumetanide 0.5mg  >< Take 1 tablet every day  #30 x11 Pt has OV 02-18-13 Allergies  Allergen Reactions  . Aspirin     REACTION: pt states GI INTOL to aspirin  . Iodine     REACTION: GI UPSET  . Irbesartan-Hydrochlorothiazide     REACTION: GI UPSET  . Penicillins     REACTION: GI UPSET

## 2013-02-18 ENCOUNTER — Other Ambulatory Visit (INDEPENDENT_AMBULATORY_CARE_PROVIDER_SITE_OTHER): Payer: Medicare Other

## 2013-02-18 ENCOUNTER — Ambulatory Visit (INDEPENDENT_AMBULATORY_CARE_PROVIDER_SITE_OTHER): Payer: Medicare Other | Admitting: Pulmonary Disease

## 2013-02-18 ENCOUNTER — Encounter: Payer: Self-pay | Admitting: Pulmonary Disease

## 2013-02-18 VITALS — BP 134/70 | HR 74 | Temp 98.5°F | Ht 62.0 in | Wt 173.0 lb

## 2013-02-18 DIAGNOSIS — E119 Type 2 diabetes mellitus without complications: Secondary | ICD-10-CM

## 2013-02-18 DIAGNOSIS — K219 Gastro-esophageal reflux disease without esophagitis: Secondary | ICD-10-CM

## 2013-02-18 DIAGNOSIS — I779 Disorder of arteries and arterioles, unspecified: Secondary | ICD-10-CM

## 2013-02-18 DIAGNOSIS — E785 Hyperlipidemia, unspecified: Secondary | ICD-10-CM

## 2013-02-18 DIAGNOSIS — F411 Generalized anxiety disorder: Secondary | ICD-10-CM

## 2013-02-18 DIAGNOSIS — F0390 Unspecified dementia without behavioral disturbance: Secondary | ICD-10-CM

## 2013-02-18 DIAGNOSIS — K589 Irritable bowel syndrome without diarrhea: Secondary | ICD-10-CM

## 2013-02-18 DIAGNOSIS — E559 Vitamin D deficiency, unspecified: Secondary | ICD-10-CM

## 2013-02-18 DIAGNOSIS — S6292XD Unspecified fracture of left wrist and hand, subsequent encounter for fracture with routine healing: Secondary | ICD-10-CM

## 2013-02-18 DIAGNOSIS — I10 Essential (primary) hypertension: Secondary | ICD-10-CM

## 2013-02-18 DIAGNOSIS — I2510 Atherosclerotic heart disease of native coronary artery without angina pectoris: Secondary | ICD-10-CM

## 2013-02-18 DIAGNOSIS — M1990 Unspecified osteoarthritis, unspecified site: Secondary | ICD-10-CM

## 2013-02-18 LAB — BASIC METABOLIC PANEL
BUN: 10 mg/dL (ref 6–23)
CO2: 23 mEq/L (ref 19–32)
Calcium: 9.3 mg/dL (ref 8.4–10.5)
Creatinine, Ser: 0.8 mg/dL (ref 0.4–1.2)
Glucose, Bld: 149 mg/dL — ABNORMAL HIGH (ref 70–99)

## 2013-02-18 LAB — CBC WITH DIFFERENTIAL/PLATELET
Basophils Absolute: 0 10*3/uL (ref 0.0–0.1)
Basophils Relative: 0.5 % (ref 0.0–3.0)
Eosinophils Relative: 2.7 % (ref 0.0–5.0)
Hemoglobin: 13.9 g/dL (ref 12.0–15.0)
Lymphocytes Relative: 29.3 % (ref 12.0–46.0)
Monocytes Relative: 6.7 % (ref 3.0–12.0)
Neutro Abs: 5.6 10*3/uL (ref 1.4–7.7)
RBC: 4.45 Mil/uL (ref 3.87–5.11)
RDW: 13.2 % (ref 11.5–14.6)
WBC: 9.1 10*3/uL (ref 4.5–10.5)

## 2013-02-18 LAB — HEPATIC FUNCTION PANEL
Albumin: 3.9 g/dL (ref 3.5–5.2)
Total Protein: 7.2 g/dL (ref 6.0–8.3)

## 2013-02-18 LAB — HEMOGLOBIN A1C: Hgb A1c MFr Bld: 7.3 % — ABNORMAL HIGH (ref 4.6–6.5)
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
## 2013-02-18 NOTE — Patient Instructions (Addendum)
Today we updated your med list in our EPIC system...    Continue your current medications the same...  We refilled your meds and the Vicodin for pain up to 3 times daily as needed...  Today we did your follow up blood work...    We will contact you w/ the results when available...   Call for any questions...  Let's plan a follow up visit in 85mo, sooner if needed for problems.Marland KitchenMarland Kitchen

## 2013-02-25 ENCOUNTER — Telehealth: Payer: Self-pay | Admitting: *Deleted

## 2013-02-25 NOTE — Telephone Encounter (Signed)
Called [facility] to discuss lab results with med tech for the pt.  Called number [phone].  Was placed on hold for over 10 mins.  i faxed these results to the fax number listed in the chart for [facility]---[phone] to let them know about her lab results and SN recs for pt to be on vit d 2000 units daily. Med list has been updated.  Nothing further is needed.

## 2013-03-06 ENCOUNTER — Other Ambulatory Visit: Payer: Self-pay | Admitting: Pulmonary Disease

## 2013-03-06 MED ORDER — LOSARTAN POTASSIUM 50 MG PO TABS: 50.0000 mg | ORAL_TABLET | Freq: Every day | ORAL | Status: DC

## 2013-03-09 ENCOUNTER — Telehealth: Payer: Self-pay | Admitting: Pulmonary Disease

## 2013-03-09 DIAGNOSIS — M1990 Unspecified osteoarthritis, unspecified site: Secondary | ICD-10-CM

## 2013-03-09 NOTE — Telephone Encounter (Signed)
Per SN- OK for PT referral. Carron Curie, CMA

## 2013-03-09 NOTE — Telephone Encounter (Signed)
Order has been placed.

## 2013-03-09 NOTE — Telephone Encounter (Signed)
Spoke with Onalee Hua at [facility]. The pt's niece is wanting the pt to be in some type of physical therapy program. I don't see anything in the chart where SN wanted the pt to do this.  Please advise if you would like the pt to participate in physical therapy.  Fax # for [employer] is [phone].

## 2013-05-14 ENCOUNTER — Other Ambulatory Visit: Payer: Self-pay | Admitting: Pulmonary Disease

## 2013-05-14 MED ORDER — METFORMIN HCL 500 MG PO TABS: 500.0000 mg | ORAL_TABLET | Freq: Every day | ORAL | Status: DC

## 2013-07-17 ENCOUNTER — Telehealth: Payer: Self-pay | Admitting: Pulmonary Disease

## 2013-07-17 NOTE — Telephone Encounter (Signed)
I called back and was advised tammy turner has left but will get a message to the oncall nurse for pt to call us back.

## 2013-07-17 NOTE — Telephone Encounter (Signed)
[name] but when they transferred me the line rang several times and no one answered and no VM. Tried called back again and the same thing no answer. WCB

## 2013-07-20 NOTE — Telephone Encounter (Signed)
[name] @ # provided Line busy x 4 Will call back

## 2013-07-21 MED ORDER — AZITHROMYCIN 250 MG PO TABS: 250.0000 mg | ORAL_TABLET | Freq: Every day | ORAL | Status: DC

## 2013-07-21 MED ORDER — MAGIC MOUTHWASH: 5.0000 mL | ORAL | Status: DC | PRN

## 2013-07-21 NOTE — Telephone Encounter (Signed)
Per SN---   Ok to send in zpak #1  Take as directed MMW  4 oz   1 tsp gargle and swallow as needed for sore throat

## 2013-07-21 NOTE — Telephone Encounter (Signed)
recommendations faxed to number given for pt nurse. Carron Curie, CMA

## 2013-07-21 NOTE — Telephone Encounter (Signed)
I spoke with tameka at [facility] and she states the pt has been c/o a sore throat x 4 days. She states the pt was running a fever a few days ago but she is not sure if she still has one. She states the pt has not c/o any other symptoms. SHe states recs can be faxed to [phone], no need to call back. Please advise. Carron Curie, CMA Allergies  Allergen Reactions  . Aspirin     REACTION: pt states GI INTOL to aspirin  . Iodine     REACTION: GI UPSET  . Irbesartan-Hydrochlorothiazide     REACTION: GI UPSET  . Penicillins     REACTION: GI UPSET

## 2013-07-21 NOTE — Telephone Encounter (Signed)
[name] was not avaliable at the time of my call; I left message with employee to have [name] return our call-will ask for Triage nurse.

## 2013-07-21 NOTE — Telephone Encounter (Signed)
Osborne Casco of [facility] returned call & can be reached [phone].  [name]

## 2013-07-31 ENCOUNTER — Telehealth: Payer: Self-pay | Admitting: Pulmonary Disease

## 2013-07-31 NOTE — Telephone Encounter (Signed)
I spoke with [facility] and she states the pt is pale, c/o sore throat, has finished a zpak and does not feel any better. She states she is also very fatigued and does not want to get out of the bed and overall does not look well. Pt family is there and wants the pt to bee seen today. I spoke with TP and based on symptoms she recs ER. [name] was advised. Carron Curie, CMA

## 2013-08-11 ENCOUNTER — Telehealth: Payer: Self-pay | Admitting: Pulmonary Disease

## 2013-08-11 NOTE — Telephone Encounter (Signed)
I called and spoke with Vernona Rieger. She is requesting VO for PT to help increase strength for pt. Also skilled nursing to help look after for pt to follow up. I advised this would be fine. Nothing further needed

## 2013-08-21 ENCOUNTER — Telehealth: Payer: Self-pay | Admitting: Pulmonary Disease

## 2013-08-21 DIAGNOSIS — E119 Type 2 diabetes mellitus without complications: Secondary | ICD-10-CM

## 2013-08-21 NOTE — Telephone Encounter (Signed)
[name] pt's poa calling again in ref to previous msg says pt's toenails are growing around her toes can be reached at [phone] says ok to lmom.Raylene Everts

## 2013-08-21 NOTE — Telephone Encounter (Signed)
Called and spoke with bridgette and she stated that she did call and speak with clara proffitt and advised her to call a podiatrist asap for an appt. She stated that the pts toe nails have grown so long that they are curled around her toes.  i called and lmom for clara to call back.

## 2013-08-21 NOTE — Telephone Encounter (Signed)
Called and spoke with [name] and she is aware of order that has been placed to get the pt an appt with podiatry.  Nothing further is needed.

## 2013-08-21 NOTE — Telephone Encounter (Signed)
[name] returning call can be reached at [phone].[name]

## 2013-08-26 ENCOUNTER — Telehealth: Payer: Self-pay | Admitting: *Deleted

## 2013-08-26 ENCOUNTER — Ambulatory Visit (INDEPENDENT_AMBULATORY_CARE_PROVIDER_SITE_OTHER): Payer: Medicare Other | Admitting: Podiatry

## 2013-08-26 ENCOUNTER — Encounter: Payer: Self-pay | Admitting: Podiatry

## 2013-08-26 VITALS — BP 143/65 | HR 91 | Resp 24 | Ht 60.0 in | Wt 165.0 lb

## 2013-08-26 DIAGNOSIS — M79609 Pain in unspecified limb: Secondary | ICD-10-CM

## 2013-08-26 DIAGNOSIS — B351 Tinea unguium: Secondary | ICD-10-CM

## 2013-08-26 NOTE — Telephone Encounter (Signed)
[name] states pt's orders states - lotion to feet daily.  [name] asked is that a medicated lotion if so they will need an order.  Contacted [name] informed that the lotion was OTC store-purchased.

## 2013-08-26 NOTE — Progress Notes (Signed)
  Subjective:    Patient ID: [name], female    DOB: 1929/07/16, 79 y.o.   MRN: [id number] "She's Diabetic.  She hasn't had anything done to her feet in 18 months.  She needs her nails trimmed especially," stated patient's niece.  Diabetes She presents for her initial diabetic visit. She has type 2 diabetes mellitus. Her disease course has been stable. Hypoglycemia symptoms include confusion. She rarely participates in exercise.      Review of Systems  Constitutional: Negative.   HENT: Positive for tinnitus.   Eyes: Negative.   Respiratory: Positive for wheezing.   Cardiovascular: Negative.   Gastrointestinal: Negative.   Endocrine: Positive for cold intolerance.  Genitourinary: Positive for urgency.  Musculoskeletal: Positive for back pain.  Skin: Negative.   Allergic/Immunologic: Negative.   Neurological: Negative.   Hematological: Negative.   Psychiatric/Behavioral: Positive for confusion and decreased concentration.       Objective:   Physical Exam This patient appears confused and presents with her power of attorney, niece [name].  Vascular: The right DP is zero over four. The left DP is two over four. The PT pulses are two over four bilaterally.  Neurological: Sensation to a 10 g monofilament wire is intact 10 over 10 locations bilaterally. Vibratory sensation intact bilaterally.  Dermatological: Extremely elongated, hypertrophic deformed toenails x10 are noted. The nail berries in thickness up to one quarter to half inch thick. Dry skin noted on both feet.  Musculoskeletal. Patient is unsteady gait walking with a cane. No deformities are noted in the feet bilaterally.       Assessment & Plan:  Assessment: Satisfactory neurovascular status bilaterally Symptomatic onychomycoses x10, with extreme neglect. Xerosis bilaterally  Plan: All 10 toenails and debrided without any bleeding. Patient advised apply all-purpose skin lotion on feet on a daily  basis. Return visit is recommended in 3 months.  Aaisha Sliter C.Leeanne Deed, DPM

## 2013-08-26 NOTE — Patient Instructions (Signed)
Apply all purpose skin lotion the feet daily.

## 2013-09-11 ENCOUNTER — Ambulatory Visit (INDEPENDENT_AMBULATORY_CARE_PROVIDER_SITE_OTHER): Payer: Medicare Other | Admitting: Pulmonary Disease

## 2013-09-11 ENCOUNTER — Other Ambulatory Visit (INDEPENDENT_AMBULATORY_CARE_PROVIDER_SITE_OTHER): Payer: Medicare Other

## 2013-09-11 ENCOUNTER — Encounter: Payer: Self-pay | Admitting: Pulmonary Disease

## 2013-09-11 VITALS — BP 120/70 | HR 75 | Temp 98.4°F | Ht 62.0 in | Wt 164.2 lb

## 2013-09-11 DIAGNOSIS — E119 Type 2 diabetes mellitus without complications: Secondary | ICD-10-CM

## 2013-09-11 DIAGNOSIS — I10 Essential (primary) hypertension: Secondary | ICD-10-CM

## 2013-09-11 DIAGNOSIS — I2510 Atherosclerotic heart disease of native coronary artery without angina pectoris: Secondary | ICD-10-CM

## 2013-09-11 DIAGNOSIS — F411 Generalized anxiety disorder: Secondary | ICD-10-CM

## 2013-09-11 DIAGNOSIS — E559 Vitamin D deficiency, unspecified: Secondary | ICD-10-CM

## 2013-09-11 DIAGNOSIS — I779 Disorder of arteries and arterioles, unspecified: Secondary | ICD-10-CM

## 2013-09-11 DIAGNOSIS — F0392 Unspecified dementia, unspecified severity, with psychotic disturbance: Secondary | ICD-10-CM

## 2013-09-11 DIAGNOSIS — M1990 Unspecified osteoarthritis, unspecified site: Secondary | ICD-10-CM

## 2013-09-11 DIAGNOSIS — F0390 Unspecified dementia without behavioral disturbance: Secondary | ICD-10-CM

## 2013-09-11 DIAGNOSIS — K589 Irritable bowel syndrome without diarrhea: Secondary | ICD-10-CM

## 2013-09-11 DIAGNOSIS — K219 Gastro-esophageal reflux disease without esophagitis: Secondary | ICD-10-CM

## 2013-09-11 DIAGNOSIS — E785 Hyperlipidemia, unspecified: Secondary | ICD-10-CM

## 2013-09-11 LAB — HEPATIC FUNCTION PANEL
AST: 27 U/L (ref 0–37)
Alkaline Phosphatase: 39 U/L (ref 39–117)
Bilirubin, Direct: 0.1 mg/dL (ref 0.0–0.3)
Total Bilirubin: 0.7 mg/dL (ref 0.3–1.2)

## 2013-09-11 LAB — CBC WITH DIFFERENTIAL/PLATELET
Basophils Absolute: 0 10*3/uL (ref 0.0–0.1)
Basophils Relative: 0.3 % (ref 0.0–3.0)
Eosinophils Absolute: 0.2 10*3/uL (ref 0.0–0.7)
Eosinophils Relative: 1.9 % (ref 0.0–5.0)
HCT: 41.3 % (ref 36.0–46.0)
Lymphocytes Relative: 29.5 % (ref 12.0–46.0)
Lymphs Abs: 2.5 10*3/uL (ref 0.7–4.0)
MCHC: 33.7 g/dL (ref 30.0–36.0)
MCV: 92.3 fl (ref 78.0–100.0)
Monocytes Absolute: 0.5 10*3/uL (ref 0.1–1.0)
Platelets: 182 10*3/uL (ref 150.0–400.0)
RDW: 13.8 % (ref 11.5–14.6)
WBC: 8.5 10*3/uL (ref 4.5–10.5)

## 2013-09-11 LAB — BASIC METABOLIC PANEL
BUN: 9 mg/dL (ref 6–23)
GFR: 99.65 mL/min (ref >60.00)
Potassium: 4 mEq/L (ref 3.5–5.1)
Sodium: 141 mEq/L (ref 135–145)

## 2013-09-11 LAB — LIPID PANEL
LDL Cholesterol: 66 mg/dL (ref 0–99)
Total CHOL/HDL Ratio: 3
VLDL: 38.6 mg/dL (ref 0.0–40.0)

## 2013-09-11 LAB — TSH: TSH: 1.55 u[IU]/mL (ref 0.35–5.50)

## 2013-09-11 NOTE — Patient Instructions (Signed)
Today we updated your med list in our EPIC system...    Continue your current medications the same...    We recommend that you add in a Vit D supplement ~2000u daily...  Today we did your follow up FASTING blood work...    We will contact you w/ the results when available...   Stay as active as possible...  Call for any questions...  Let's plan a follow up visit in 87mo, sooner if needed for problems.Marland KitchenMarland Kitchen

## 2013-09-11 NOTE — Progress Notes (Signed)
Subjective:    Patient ID: [name], female    DOB: 1929-06-28, 79 y.o.   MRN: [id number]  HPI 79 y/o WF here for a follow up visit... she has mult med problems as noted below...  ~  July 30, 2011:  78mo ROV & post hosp check> Family took her to the ER 07/19/11 for progressive dementia, paranoia, confusion, etc; her brother had been helping out w/ her care in her own home but his health is poor & he is no longer capable of providing for her; pt has no children & they are the next of kin; her sister & niece cannot help her either; they were hoping for some kind of placement or a psyche consult to help w/ meds (Memory, paranoia, etc)...  She was seen by ER doc & referred to [facility] for adm due to a mild UTI (abn sediment, neg cult) & they felt this had caused some worsening in her mental status> CT Brain w/ artophy & sm vessel dis; MRI Brain w/ similar findings and no acute changes; CXR/ EKG/ 2DEcho w/o acute problems and LABS reviewed & all otherw essentially WNL...  She was disch on Levaquin, otherw no change in prev meds...  Obviously the situation is no better & family is at a total loss for what to do, they have seen an eldercare attorney but the costs are prohibitive;  We discussed Psychiatric consultation, possible Neurology follow up w/ [facility] for their suggestions...  ~  February 18, 2013:  38mo ROV & we reviewed her record in EPIC> she was Adm 12/12 by [facility] w/ fall and UTI, improved on meds and Psyche added Seroquel 12.5mg  Qhs for her Dementia & agitation; she was disch to [facility] AL facility... Then she was seen back in the ER 1/13 after another fall w/ lip laceration... She was Adm 10/13 by DrKuzma for hand surg after a fall and fx to 4th & 5th metacarpals of her left hand... Her most recent ER visit was 01/14/13 after a fall w/ right wrist hematoma (no fx) & low back pain...  We reviewed the following medical problems during today's office visit >>     Hx recurrent bronchitis  & AbnCXR> CTChest in 2007 showed 7mm nodule in RLL, not seen on serial CXRs (s/p CABG, ectasia of Ao, clear lungs, osteopenia, DDD spine); she denies URI, cough, sput, SOB, etc...    HBP> on MetopER25, Caduet5-20, Losar50, Bumex0.5mg ; BP= 134/70 & she denies CP, palpit, SOB, edema, etc...    CAD> on Plavix75; Hx PTCA to LAD in past then CABG x1 11/02; she is followed by [facility] for Cards- stable, no changes needed...    ASPVD> on above meds; she has known left arm claud in past w/ left subclav stent placed 10/12; prev MRI Brain showed atrophy & sm vessel dis; MRA w/ mod stenosis in vertebral art & mild intracranial atherosclerotic changes; doing well on Plavix...    Hyperlipid> on Caduet5-20; it is hard to get her in for FLP- prev Chol has been ok but TG sl elev & she is advised on a low fat diet...    DM> on Metform500 & Glimep1mg -1/2; labs 4/14 showed BS=149, A1c=7.3 and advised same meds for now but needs better low carb diet & wt reduction...    GI- HH, GERD, HxDysphagia, Divertics, IBS> on Omep20; she denies adb pain, dysphagia, n/v, c/d, blood seen; last colon 1998 w/ divertics only...    Hx KidStones, UTI> she denies urinary tract symptoms,  dysuria, freq, incont, etc...    DJD w/ mult falls & minor injuries> on Vicodin5, Tylenol; she has had mult falls- w/ lip lac, fx left 4th & 5th metacarpals, right wrist hematoma, LBP; we reviewed safety measures to prevent falls...    Osteopenia & VitD defic> on Boniva150/mo; she is due for a BMD measurement but she wants to wait...    Senile dementia w/ delusional features>  vasc dementia w/ paranoid flavor & improved w/ Seroquel 12.5 Qhs...    Anxiety> on Chloraz7.5mg  prn & Seroquel25-1/2Qhs; she states she's doing well "just ornery" she says; living at [facility] "they treat me well" We reviewed prob list, meds, xrays and labs> see below for updates >> Timje spent ... LABS 4/14:  Chems- ok x BS=149 & A1c=7.3 on meds listed;  CBC- wnl;  TSH=2.02;   VitD=21...  ~  September 11, 2013:  6-54mo ROV & [name] is here w/ her POA- cousin Heritage manager, pt lives at [facility] doing satis, fairly stable mentally perhaps sl progressive, managing ok but too sedentary, not participating in exercise enough etc; pt has no complaints or concerns "I can do as well as a 79 y/o"... We reviewed the following medical problems during today's office visit >>     Hx recurrent bronchitis & AbnCXR> CTChest in 2007 showed 7mm nodule in RLL, not seen on serial CXRs (s/p CABG, ectasia of Ao, clear lungs, osteopenia, DDD spine); she denies recent URI, cough, sput, SOB, etc...    HBP> on MetopER25, Caduet5-20, Bumex0.5mg  & off the Losar50; BP= 120/70 & she denies CP, palpit, SOB, edema, etc...    CAD> on Plavix75; Hx PTCA to LAD in past then CABG x1 11/02; she is followed by [facility] for Cards- stable, doing satis w/o clinical angina, palpit, etc...    ASPVD> on above meds; she has known left arm claud in past w/ left subclav stent placed 10/12; prev MRI Brain showed atrophy & sm vessel dis; MRA w/ mod stenosis in vertebral art & mild intracranial atherosclerotic changes; doing well on Plavix...    Hyperlipid> on Caduet5-20;  FLP 11/14 showed TChol 148, TG 193, HDL 43, LDL 66; rec to continue same med & low fat diet0...     DM> on Metform500 & Glimep1mg -1/2;  Labs 4/14 showed BS=133, A1c=7.6 and advised same meds for now but needs better low carb diet efforts...    GI- HH, GERD, HxDysphagia, Divertics, IBS> on Omep20; she denies adb pain, dysphagia, n/v, c/d, blood seen; last colon 1998 w/ divertics only...    Hx KidStones, UTI> she denies urinary tract symptoms, dysuria, freq, incont, etc...    DJD w/ mult falls & minor injuries> on Tramadol50, Tylenol; she has had mult falls- w/ lip lac, fx left 4th & 5th metacarpals, right wrist hematoma, LBP; we reviewed safety measures to prevent falls...    Osteopenia & VitD defic> on Boniva150/mo; she is due for a BMD measurement but she  declines this f/u study...    Senile dementia w/ delusional features>  vasc dementia w/ paranoid flavor & improved w/ Seroquel 12.5 Qhs....    Anxiety> on Chloraz7.5mg  prn & Seroquel25-1/2Qhs; she states she's doing well "just ornery" she says; living at [facility] "they treat me well" We reviewed prob list, meds, xrays and labs> see below for updates >> she had the 2014 Flu vaccine in Oct... LABS 11/14:  FLP- at goals on Caduet5-20 but TG=193;  Chems- ok x BS=133 A1c=7.6;  CBC- wnl;  TSH=1.55.Marland KitchenMarland Kitchen  Problem List:  Hx of BRONCHITIS, RECURRENT - no recent problem w/ cough, phlegm, SOB, etc. ABNORMAL CHEST XRAY - CTChest 3/07 w/ 7mm nodule RLL, it only measured 4mm in 2003... it is not well seen on routine CXR and we are following...  ~  CXR 3/08 nodule is not seen (ER film 7/08 = unchanged). ~  CXR 9/09 = NAD, no nodule seen... ~  CXR 6/10 showed s/p CABG, no rib fx, NAD.Marland Kitchen. ~  CXR 7/12 showed borderline cardiomeg, s/p CABG, ectasia of Ao, clear lungs, osteopenia, DDD spine... ~  4/14 & 11/14:  CTChest in 2007 showed 7mm nodule in RLL, not seen on serial CXRs (s/p CABG, ectasia of Ao, clear lungs, osteopenia, DDD spine); she denies URI, cough, sput, SOB, etc  Hx of PULMONARY EMBOLISM - PTE 1/03- prev on Coumadin & [facility]... this was discontinued in Nov09 after fall w/ large right knee hematoma...  HYPERTENSION  - on COZAAR 50mg /d,  BUMEX 0.5mg /d, & CADUET 5-20daily (?she stopped her prev Metoprolol 25mg Bid?)... BP=142/84 &  denies HA, fatigue, visual changes, CP, syncope, dyspnea, edema, etc... ~  3/10:  meds refilled and reviewed w/ pt... we asked her to get her brother to help her w/ her meds. ~  3/11:  meds refilled & reviewed w/ pt who is here by herself today. ~  7/12:  Pt here by herself & didn't bring meds; prev Metoprolol is not on her current list & she doesn't know her meds; asked to get relatives to help w/ her meds; asked to bring all meds to each OV==>  we will add METOPROLOL ER 25mg /d. ~  4/14:  on MetopER25, Caduet5-20, Losar50, Bumex0.5mg ; BP= 134/70 & she denies CP, palpit, SOB, edema, etc. ~  11/14: on MetopER25, Caduet5-20, Bumex0.5mg  & off the Losar50; BP= 120/70 & she remains asymptomatic...  CORONARY ARTERY DISEASE  - on PLAVIX 75mg /d... Hx prev PTCA LAD and subseq CABG x1 in 11/02... she is followed by [facility] & seen yearly, doing satis & no changes made... she gets rare episodes of sharp lateral CWP... ~  4/14 & 11/14:  on Plavix75; Hx PTCA to LAD in past then CABG x1 11/02; she is followed by [facility] for Cards- stable, no changes needed.  CEREBROVASCULAR DISEASE - on PLAVIX as above... prev MRI w/ atrophy & MRA w/ mod stenosis in distal vertebral art, & mild intracranial atherosclerotic changes... ~  CT Brain & MRI done 9/12 showed atrophy & sm vessel dis, no acute changes...  PERIPHERAL VASCULAR DISEASE - Hx L arm claudication and L subclav stent 10/02. ~  4/14: on above meds; she has known left arm claud in past w/ left subclav stent placed 10/12; prev MRI Brain showed atrophy & sm vessel dis; MRA w/ mod stenosis in vertebral art & mild intracranial atherosclerotic changes; doing well on Plavix.  HYPERLIPIDEMIA - on CADUET 5/20... ~  FLP 7/08 showing TChol 137, TG 200, HDL 36, LDL 61... reminded about low fat diet...  ~  FLP 2/09 showed TChol 150, TG 249, HDL 36, LDL 76... encouraged to diet & get weight down... ~  FLP 9/09 showed Tchol 112, TG 140, HDL 30, LDL 55... continue same... ~  FLP 8/12 showed TChol 178, TG 210, HDL 47, LDL 101... Needs better diet, continue Caduet. ~  FLP 11/14 on Caduet5-20 showed TChol 148, TG 193, HDL 43, LDL 66...   DIABETES MELLITUS, MILD  - stable on GLIMEPERIDE 1mg /d and METFORMIN 500mg /d... BS at  home are good, no low sugar reactions...  ~  labs 2/09 showed BS= 124, HgA1c= 6.6 ~  labs 9/09 showed BS= 103, HgA1c= 6.3 ~  labs 1/10 showed BS= 96, HgA1c= 5.8 ~  labs 3/10 showed BS=  93, HgA1c= 6.6 ~  labs 10/10 showed BS= 122, A1c= 6.5 ~  Labs 8/12 showed BS= 118, A1c= 7.1.Marland KitchenMarland Kitchen Told she may need more meds, for now rec to take both regularly & better diet... ~  4/14: on Metform500 & Glimep1mg -1/2; labs 4/14 showed BS=149, A1c=7.3 and advised same meds for now but needs better low carb diet & wt reduction. ~  11/14: on Metform500 & Glimep1mg -1/2;  Labs 4/14 showed BS=133, A1c=7.6 and advised same meds for now but needs better low carb diet efforts...  GERD  - on OMEPRAZOLE 20mg Bid... hx HH and dysphagia w/ dilatation in past> no current problems reported & denies nausea, vomiting, heartburn, diarrhea, constipation, blood in stool, abdominal pain, swelling, gas... ~  EGD DrPerry 7/07 showed HH,GERD, stricture dil...  ~  Repeat GI eval DrPerry 4/12 w/ EGD showing ulcerative esophagitis & treated w/ incr PPI- OMEPRAZOLE 20mg  bid... ~  4/14 & 11/14: on Omep20; she denies adb pain, dysphagia, n/v, c/d, blood seen; last colon 1998 w/ divertics only  IBS  - last colon 11/98 by DrPerry showed sigm divertics otherw neg.  Hx of NEPHROLITHIASIS  URINARY TRACT INFECTION > brief hosp 9/12 w/ UTI & mental status changes treated w/ Levaquin...  DEGENERATIVE JOINT DISEASE  KNEE INJURY, RIGHT (ICD-959.7) ~  Right arm surgery by DrWeingold in 2009 ~  Left 4th & 5th metacarpal fx w/ surg 10/13 by Reinaldo Berber ~  Fall 3/14 w/ hematoma right wrist (no fx) & LBP w/ XRay showing essent norm L/S spine & diffuse vasc calcif...  OSTEOPOROSIS  - on BONIVA 150mg /d, Ca++, MVI + Vit D 1000/d... ~  last BMD= 5/06 showing TScores -0.9 to -1.7.Marland Kitchen. ~  She declines f/u BMD but wants to continue the boniva; we may be forced to stopping this med soon for drug holiday...  VITAMIN D DEFICIENCY - Vit D level measured 16 2/09 and started on VitD 50K per week... ~  labs 9/09 showed Vit D level = 40... therefore changed to 1000 u daily OTC... ~  labs 3/10 showed Vit D = 29... rec> keep 1000 u daily OTC... ~  Labs 4/14  showed VitD = 21 & rec to start Vit D supplement ~2000u daily & take regularly...  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  ANXIETY - she prefers the CHLORAZEPATE for Prn use; she has refused Psychiatric consultation in the past...   Past Surgical History  Procedure Laterality Date  . Appendectomy    . Cataract extraction    . Hemorrhoid surgery    . Abdominal hysterectomy    . Cabg  1 vessel w/svg to lad  2003  . Mult orthopedic procedures      Dr. Arnette Norris arm, CTS,cyst removal  . Open heart surgery    . Fracture surgery      Outpatient Encounter Prescriptions as of 09/11/2013  Medication Sig  . acetaminophen (TYLENOL) 325 MG tablet Take 650 mg by mouth every 6 (six) hours as needed.   . Alum & Mag Hydroxide-Simeth (MAGIC MOUTHWASH) SOLN Take 5 mLs by mouth as needed.  Marland Kitchen amLODipine-atorvastatin (CADUET) 5-20 MG per tablet  Take 1 tablet by mouth daily.  . bumetanide (BUMEX) 0.5 MG tablet Take 1 tablet (0.5 mg total) by mouth daily.  . Cholecalciferol (VITAMIN D) 2000 UNITS CAPS Take 1 capsule by mouth daily.  . clindamycin (CLEOCIN) 150 MG capsule Take 4 caps by mouth 1 hour prior to dental appt  . clopidogrel (PLAVIX) 75 MG tablet TAKE 1 TABLET BY MOUTH EVERY DAY  . clorazepate (TRANXENE) 7.5 MG tablet Take 7.5 mg by mouth 3 (three) times daily as needed.   Marland Kitchen glimepiride (AMARYL) 1 MG tablet Take 1/2 tablet by mouth once daily  . HYDROcodone-acetaminophen (NORCO/VICODIN) 5-325 MG per tablet Take 1 tablet by mouth 3 (three) times daily as needed for pain.  Marland Kitchen ibandronate (BONIVA) 150 MG tablet Take 1 tablet (150 mg total) by mouth every 30 (thirty) days. Take in the morning with a full glass of water, on an empty stomach, and do not take anything else by mouth or lie down for the next 30 min.  . metFORMIN (GLUCOPHAGE) 500 MG  tablet Take 1 tablet (500 mg total) by mouth daily with breakfast.  . metoprolol succinate (TOPROL-XL) 25 MG 24 hr tablet Take 25 mg by mouth daily.  Marland Kitchen omeprazole (PRILOSEC) 20 MG capsule Take 1 capsule (20 mg total) by mouth daily.  . QUEtiapine (SEROQUEL) 25 MG tablet Take 0.5 tablets (12.5 mg total) by mouth at bedtime.  . traMADol (ULTRAM) 50 MG tablet Take 50 mg by mouth every 6 (six) hours as needed for pain.  . [DISCONTINUED] azithromycin (ZITHROMAX) 250 MG tablet Take 1 tablet (250 mg total) by mouth daily.  . [DISCONTINUED] losartan (COZAAR) 50 MG tablet Take 1 tablet (50 mg total) by mouth daily.    Allergies  Allergen Reactions  . Aspirin     REACTION: pt states GI INTOL to aspirin  . Iodine     REACTION: GI UPSET  . Irbesartan-Hydrochlorothiazide     REACTION: GI UPSET  . Penicillins     REACTION: GI UPSET    Current Medications, Allergies, Past Medical History, Past Surgical History, Family History, and Social History were reviewed in [employer] record.    Review of Systems         See HPI - all other systems neg except as noted... She feels that she is doing fine & denies prob w/ her memory. The patient complains of dyspnea on exertion.  The patient denies anorexia, fever, weight loss, weight gain, vision loss, decreased hearing, hoarseness, chest pain, syncope, peripheral edema, prolonged cough, headaches, hemoptysis, abdominal pain, melena, hematochezia, severe indigestion/heartburn, hematuria, incontinence, muscle weakness, suspicious skin lesions, transient blindness, difficulty walking, depression, unusual weight change, abnormal bleeding, enlarged lymph nodes, and angioedema.     Objective:   Physical Exam     WD, overweight, 79 y/o WF in NAD...  GENERAL:  Alert, pleasant & cooperative... still sl confused w/ some paranoia... HEENT:  [place]/AT, EOM-wnl, PERRLA, EACs-clear, TMs-wnl, NOSE-clear, THROAT-clear & wnl. NECK:  Supple w/ fairROM; no  JVD; normal carotid impulses w/o bruits; no thyromegaly or nodules palpated; no lymphadenopathy. CHEST:  Clear to P & A; without wheezes/ rales/ or rhonchi. HEART:  Regular Rhythm; without murmurs/ rubs/ or gallops. ABDOMEN:  Soft & nontender; normal bowel sounds; no organomegaly or masses detected. EXT:  no deformity, mild arthritic changes; no varicose veins/ +venous insuffic/ tr edema. NEURO:  CN's intact;  no focal neuro deficits... +some confusion & paranoid ideations... DERM:  No lesions noted x right knee- healed,  no rash...  RADIOLOGY DATA:  Reviewed in the EPIC EMR & discussed w/ the patient...  LABORATORY DATA:  Reviewed in the EPIC EMR & discussed w/ the patient...   Assessment & Plan:    DEMENTIA>  Likely a vascular, senile dementia with paranoid ideations going back at least 47yrs; she has stabilized on Seroquel 12.5mg  in the AL facility & they are comfortable w/ her there...  Anxiety>  She has Tranxene for as needed use, she has prev refused psychiatric consultation...   HBP>  BP stable on Metop, Amlodip, Bumex- continue same, no salt, get wt down...  CAD>  S/p CABG 2002 & she is stable on Plavix, no angina but too sedentary & needs incr exercise; followed by [employer]...  Cerebrovasc & Periph vasc disease>  On Plavix w/o cerebral ischemic symptoms...  HYPERLIPIDEMIA>  On Caduet + diet, but wt 164# w/o change & needs better low fat wt reducing diet! Discussed w/ pt...  DM>  She is encouraged to take the Metform500 & Glimep1mg -1/2;  A1c is up to 7.6 but she is reasonably stable, continue same...  GI> GERD, Esophagitis, IBS>  Prev eval 4/12 by DrPerry reviewed & he incr her Omep to Bid...  DJD, Osteoporosis, Vit D defic>  Continue Boniva, Calcium, MVI, Vit D 1000u daily...   Patient's Medications  New Prescriptions   No medications on file  Previous Medications   ACETAMINOPHEN (TYLENOL) 325 MG TABLET    Take 650 mg by mouth every 6 (six) hours as needed.    ALUM &  MAG HYDROXIDE-SIMETH (MAGIC MOUTHWASH) SOLN    Take 5 mLs by mouth as needed.   AMLODIPINE-ATORVASTATIN (CADUET) 5-20 MG PER TABLET    Take 1 tablet by mouth daily.   BUMETANIDE (BUMEX) 0.5 MG TABLET    Take 1 tablet (0.5 mg total) by mouth daily.   CHOLECALCIFEROL (VITAMIN D) 2000 UNITS CAPS    Take 1 capsule by mouth daily.   CLINDAMYCIN (CLEOCIN) 150 MG CAPSULE    Take 4 caps by mouth 1 hour prior to dental appt   CLOPIDOGREL (PLAVIX) 75 MG TABLET    TAKE 1 TABLET BY MOUTH EVERY DAY   CLORAZEPATE (TRANXENE) 7.5 MG TABLET    Take 7.5 mg by mouth 3 (three) times daily as needed.    GLIMEPIRIDE (AMARYL) 1 MG TABLET    Take 1/2 tablet by mouth once daily   HYDROCODONE-ACETAMINOPHEN (NORCO/VICODIN) 5-325 MG PER TABLET    Take 1 tablet by mouth 3 (three) times daily as needed for pain.   IBANDRONATE (BONIVA) 150 MG TABLET    Take 1 tablet (150 mg total) by mouth every 30 (thirty) days. Take in the morning with a full glass of water, on an empty stomach, and do not take anything else by mouth or lie down for the next 30 min.   METFORMIN (GLUCOPHAGE) 500 MG TABLET    Take 1 tablet (500 mg total) by mouth daily with breakfast.   METOPROLOL SUCCINATE (TOPROL-XL) 25 MG 24 HR TABLET    Take 25 mg by mouth daily.   OMEPRAZOLE (PRILOSEC) 20 MG CAPSULE    Take 1 capsule (20 mg total) by mouth daily.   QUETIAPINE (SEROQUEL) 25 MG TABLET    Take 0.5 tablets (12.5 mg total) by mouth at bedtime.   TRAMADOL (ULTRAM) 50 MG TABLET    Take 50 mg by mouth every 6 (six) hours as needed for pain.  Modified Medications   No medications on file  Discontinued Medications  AZITHROMYCIN (ZITHROMAX) 250 MG TABLET    Take 1 tablet (250 mg total) by mouth daily.   LOSARTAN (COZAAR) 50 MG TABLET    Take 1 tablet (50 mg total) by mouth daily.

## 2013-10-19 ENCOUNTER — Telehealth: Payer: Self-pay | Admitting: Pulmonary Disease

## 2013-10-19 NOTE — Telephone Encounter (Signed)
Forms have been signed by SN and i have called and lmom for [employer] to make him aware that these forms are ready to be picked up.  Will sign off of message.

## 2013-11-30 ENCOUNTER — Ambulatory Visit: Payer: Medicare Other | Admitting: Podiatry

## 2013-12-11 ENCOUNTER — Telehealth: Payer: Self-pay | Admitting: Pulmonary Disease

## 2013-12-11 NOTE — Telephone Encounter (Signed)
Please advise [name] if you have any fax on pt? thanks

## 2013-12-15 NOTE — Telephone Encounter (Signed)
Spoke to [facility] @ [place], I do have request for corrected face to face.  It has been corrected as she has requested, and faxed back .[name] HamsAlida Lelend [name]

## 2013-12-18 ENCOUNTER — Telehealth: Payer: Self-pay | Admitting: Pulmonary Disease

## 2013-12-18 NOTE — Telephone Encounter (Signed)
Will keep a look out for these forms and have SN fill these out once received.

## 2014-01-01 NOTE — Telephone Encounter (Signed)
Please advise thanks.

## 2014-01-07 ENCOUNTER — Telehealth: Payer: Self-pay | Admitting: Pulmonary Disease

## 2014-01-07 NOTE — Telephone Encounter (Signed)
Have these forms been received? Thanks. 

## 2014-01-07 NOTE — Telephone Encounter (Signed)
Orders have been signed by SN and copies have been made and placed in the scan folder.  Called and lmom to make them aware that orders will be left up front and can be picked up tomorrow.

## 2014-01-11 NOTE — Telephone Encounter (Signed)
These forms have been completed and have been scanned into the pts chart.

## 2014-02-10 ENCOUNTER — Telehealth: Payer: Self-pay | Admitting: Pulmonary Disease

## 2014-02-10 NOTE — Telephone Encounter (Signed)
Spoke with [name]; SN has signed and she is faxing back to them. I spoke with Cathlean CowerAlesia at [facility]; she is aware that we are faxing back the form now. Nothing further needed at this time.

## 2014-02-19 ENCOUNTER — Telehealth: Payer: Self-pay | Admitting: Pulmonary Disease

## 2014-02-19 NOTE — Telephone Encounter (Signed)
I called and spoke with Rep  They had already received the okay for supplies  They just need SN to sign and date form  They are faxing this now to up front fax  Will forward to [name] to make her aware

## 2014-02-25 NOTE — Telephone Encounter (Signed)
Forms have been completed by SN and sent up front to be faxed back and scanned into the pts chart.

## 2014-03-10 ENCOUNTER — Telehealth: Payer: Self-pay | Admitting: Pulmonary Disease

## 2014-03-10 NOTE — Telephone Encounter (Signed)
Called and spoke with tabitha and she stated that the form had some changes when faxed back and this will need to be initialed by the MD and dated and faxed back.   Will fax the form back once completed by SN .

## 2014-03-15 ENCOUNTER — Ambulatory Visit: Payer: Medicare Other | Admitting: Family Medicine

## 2014-03-15 ENCOUNTER — Ambulatory Visit: Payer: Medicare Other | Admitting: Pulmonary Disease

## 2014-03-22 ENCOUNTER — Encounter: Payer: Self-pay | Admitting: Family Medicine

## 2014-03-22 ENCOUNTER — Ambulatory Visit (INDEPENDENT_AMBULATORY_CARE_PROVIDER_SITE_OTHER): Payer: Medicare Other | Admitting: Family Medicine

## 2014-03-22 VITALS — BP 140/80 | HR 80 | Temp 98.3°F | Ht 62.0 in | Wt 165.0 lb

## 2014-03-22 DIAGNOSIS — F0390 Unspecified dementia without behavioral disturbance: Secondary | ICD-10-CM

## 2014-03-22 DIAGNOSIS — K222 Esophageal obstruction: Secondary | ICD-10-CM

## 2014-03-22 DIAGNOSIS — I779 Disorder of arteries and arterioles, unspecified: Secondary | ICD-10-CM

## 2014-03-22 DIAGNOSIS — E119 Type 2 diabetes mellitus without complications: Secondary | ICD-10-CM

## 2014-03-22 DIAGNOSIS — M810 Age-related osteoporosis without current pathological fracture: Secondary | ICD-10-CM

## 2014-03-22 DIAGNOSIS — I10 Essential (primary) hypertension: Secondary | ICD-10-CM

## 2014-03-22 DIAGNOSIS — K219 Gastro-esophageal reflux disease without esophagitis: Secondary | ICD-10-CM

## 2014-03-22 DIAGNOSIS — F22 Delusional disorders: Principal | ICD-10-CM

## 2014-03-22 DIAGNOSIS — Z23 Encounter for immunization: Secondary | ICD-10-CM

## 2014-03-22 DIAGNOSIS — F0392 Unspecified dementia, unspecified severity, with psychotic disturbance: Secondary | ICD-10-CM

## 2014-03-22 DIAGNOSIS — E785 Hyperlipidemia, unspecified: Secondary | ICD-10-CM

## 2014-03-22 DIAGNOSIS — I739 Peripheral vascular disease, unspecified: Secondary | ICD-10-CM

## 2014-03-22 LAB — BASIC METABOLIC PANEL
BUN: 13 mg/dL (ref 6–23)
CALCIUM: 9.2 mg/dL (ref 8.4–10.5)
CHLORIDE: 104 meq/L (ref 96–112)
CO2: 29 mEq/L (ref 19–32)
Creatinine, Ser: 1 mg/dL (ref 0.4–1.2)
GFR: 55.62 mL/min — ABNORMAL LOW (ref >60.00)
Glucose, Bld: 123 mg/dL — ABNORMAL HIGH (ref 70–99)
Potassium: 4.2 mEq/L (ref 3.5–5.1)
Sodium: 142 mEq/L (ref 135–145)

## 2014-03-22 LAB — HEMOGLOBIN A1C: Hgb A1c MFr Bld: 7.4 % — ABNORMAL HIGH (ref 4.6–6.5)

## 2014-03-22 MED ORDER — PNEUMOCOCCAL 13-VAL CONJ VACC IM SUSP: 0.5000 mL | INTRAMUSCULAR | Status: DC

## 2014-03-22 NOTE — Progress Notes (Signed)
Subjective:    Patient ID: [name], female    DOB: 24-Oct-1929, 80 y.o.   MRN: [id number]  HPI Patient is seen to establish care. She has history of fairly advanced dementia and history is very limited from patient. She has history of GERD, esophageal stricture, IBS, kidney stones, degenerative arthritis, osteoporosis, reported subclavian arterial stenosis, type 2 diabetes, history of CAD with previous bypass, hypertension, hyperlipidemia. Medications are reviewed.  Type 2 diabetes with fair control. Last A1c 7.6%. We do not have any recent blood sugars today. No reported hypoglycemia.  She lives at assisted [facility] in [place]. She has assistance with medications. No recent reported falls.  Denies any recent dysphagia symptoms. Denies any chest pain the patient is a poor historian.  The following is reviewed;  Past Medical History  Diagnosis Date  . Bronchitis, mucopurulent recurrent   . Hematoma     Large hematoma from fall, November, 2009, Coumadin stopped  . Pulmonary embolism   . Hypertension   . CAD (coronary artery disease)     2008, question slight anteroapical ischemia  . Subclavian arterial stenosis     PCI  . Hyperlipidemia   . Diabetes mellitus   . GERD (gastroesophageal reflux disease)   . IBS (irritable bowel syndrome)   . Nephrolithiasis   . DJD (degenerative joint disease)   . Right knee injury   . Osteoporosis   . Senile dementia with delusional features   . Anxiety   . Palpitations   . Hiatal hernia   . Hemorrhoids   . Obesity   . SOB (shortness of breath)   . Esophageal stricture   . Ejection fraction     EF 70%, nuclear, 2007  . Hx of CABG     2003, ... LIMA not used... small caliber  . Carotid artery disease     Doppler, December, 2007, bilateral 0-39%  . Aspirin intolerance   . Dementia     Senile dementia with delusional features  . Warfarin anticoagulation     stopped 09/2008 with large hematoma from fall   Past  Surgical History  Procedure Laterality Date  . Appendectomy    . Cataract extraction    . Hemorrhoid surgery    . Abdominal hysterectomy    . Cabg  1 vessel w/svg to lad  2003  . Mult orthopedic procedures      Dr. Arnette NorrisWeingold--l arm, CTS,cyst removal  . Open heart surgery    . Fracture surgery      reports that she has never smoked. She does not have any smokeless tobacco history on file. She reports that she does not drink alcohol or use illicit drugs. family history includes Cancer in her mother; Hypertension in her mother; Leukemia in her sister; Stroke in her father and mother. There is no history of Colon cancer. Allergies  Allergen Reactions  . Aspirin     REACTION: pt states GI INTOL to aspirin  . Iodine     REACTION: GI UPSET  . Irbesartan-Hydrochlorothiazide     REACTION: GI UPSET  . Penicillins     REACTION: GI UPSET      Review of Systems  Constitutional: Negative for fatigue and unexpected weight change.  Eyes: Negative for visual disturbance.  Respiratory: Negative for cough, chest tightness, shortness of breath and wheezing.   Cardiovascular: Negative for chest pain, palpitations and leg swelling.  Endocrine: Negative for polydipsia and polyuria.  Neurological: Negative for dizziness, seizures, syncope, weakness, light-headedness and headaches.  Objective:   Physical Exam  Constitutional:  Alert pleasant demented cooperative 80 year old female  HENT:  Right Ear: External ear normal.  Left Ear: External ear normal.  Mouth/Throat: Oropharynx is clear and moist.  Neck: Neck supple. No thyromegaly present.  Cardiovascular: Normal rate.  Exam reveals no gallop.   Murmur heard. Pulmonary/Chest: Effort normal and breath sounds normal. No respiratory distress. She has no wheezes. She has no rales.  Musculoskeletal: She exhibits edema (1+ pitting edema legs bilaterally).  Neurological: She is alert.  Skin:  Feet are very dry. No calluses. No ulcerations.  Warm to touch. Good capillary refill  Psychiatric: She has a normal mood and affect. Her behavior is normal.          Assessment & Plan:  #1 advanced dementia. Limited history available from patient. We did not do any cognitive testing at this point but appear she has fairly advanced dementia and would not likely benefit from medication this point #2 type 2 diabetes. History fair control. Recheck A1c #3 history of diuretic therapy with Bumex. Check basic metabolic panel #4 hypertension which apparently been fairly well controlled #5 history of dyslipidemia. Lipids were stable last fall and we'll plan to recheck at followup in 6 months #6 health maintenance. Prevnar 13 vaccination given #7 history of GERD with previous history of esophageal stricture #8 history of CAD with previous bypass #9 history of osteoporosis- patient maintained on calcium and vitamin D along with Boniva #10 social. Patient resides at assisted living complex. She has appointed health care power of attorney-[name].

## 2014-03-22 NOTE — Progress Notes (Signed)
Pre visit review using our clinic review tool, if applicable. No additional management support is needed unless otherwise documented below in the visit note. 

## 2014-04-16 ENCOUNTER — Telehealth: Payer: Self-pay

## 2014-04-16 NOTE — Telephone Encounter (Signed)
Hydrocodone  Last visit 03/22/14 Last refill 02/18/13 #90 5 refill  Print out

## 2014-04-18 NOTE — Telephone Encounter (Signed)
Refill Ok.  I know with her advanced dementia assessing pain can be difficult.  Try to limit unless she clearly seems to be in pain.

## 2014-04-19 MED ORDER — HYDROCODONE-ACETAMINOPHEN 5-325 MG PO TABS: 1.0000 | ORAL_TABLET | Freq: Three times a day (TID) | ORAL | Status: DC | PRN

## 2014-04-19 NOTE — Telephone Encounter (Signed)
Faxed RX to nursing home

## 2014-09-22 ENCOUNTER — Ambulatory Visit: Payer: Medicare Other | Admitting: Family Medicine

## 2015-01-17 IMAGING — CR DG WRIST COMPLETE 3+V*R*
4 series · 4 of 4 positions shown · non-contrast
Comparison: None.

CLINICAL DATA: Fall.  Pain

RIGHT WRIST - COMPLETE 3+ VIEW

[x wrist pa right]
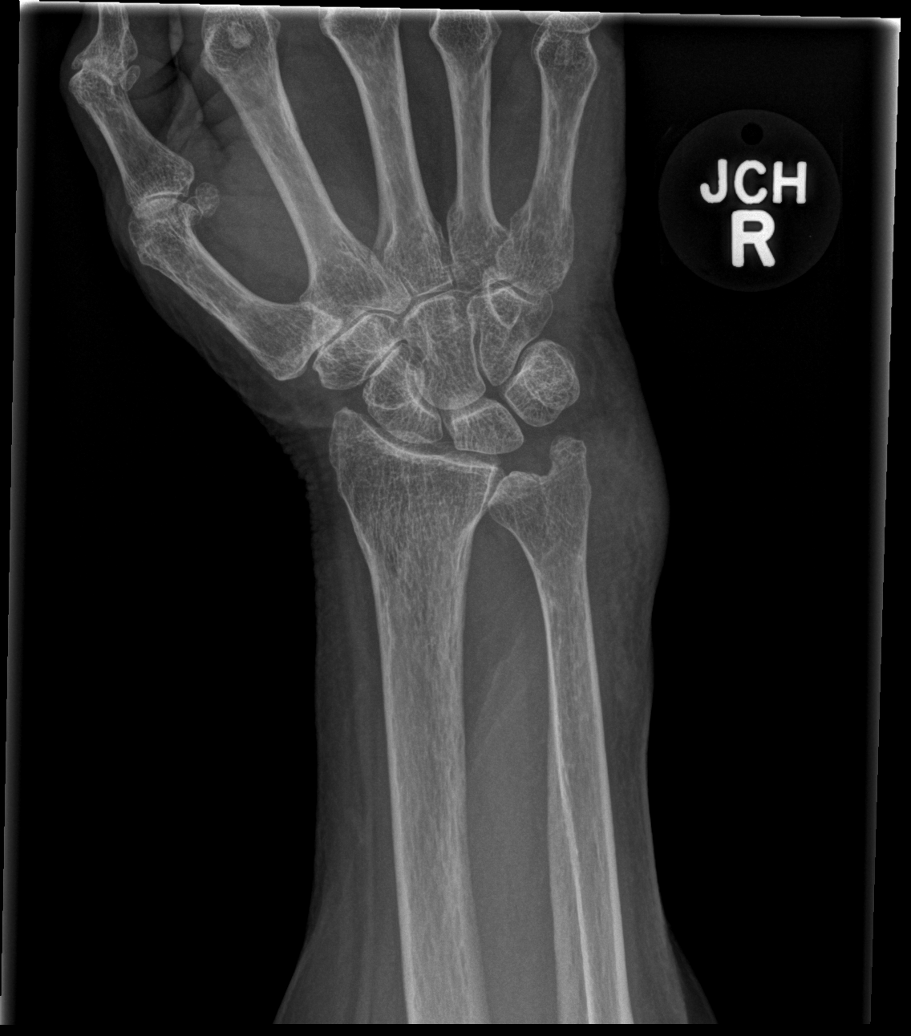

[x wrist obl right]
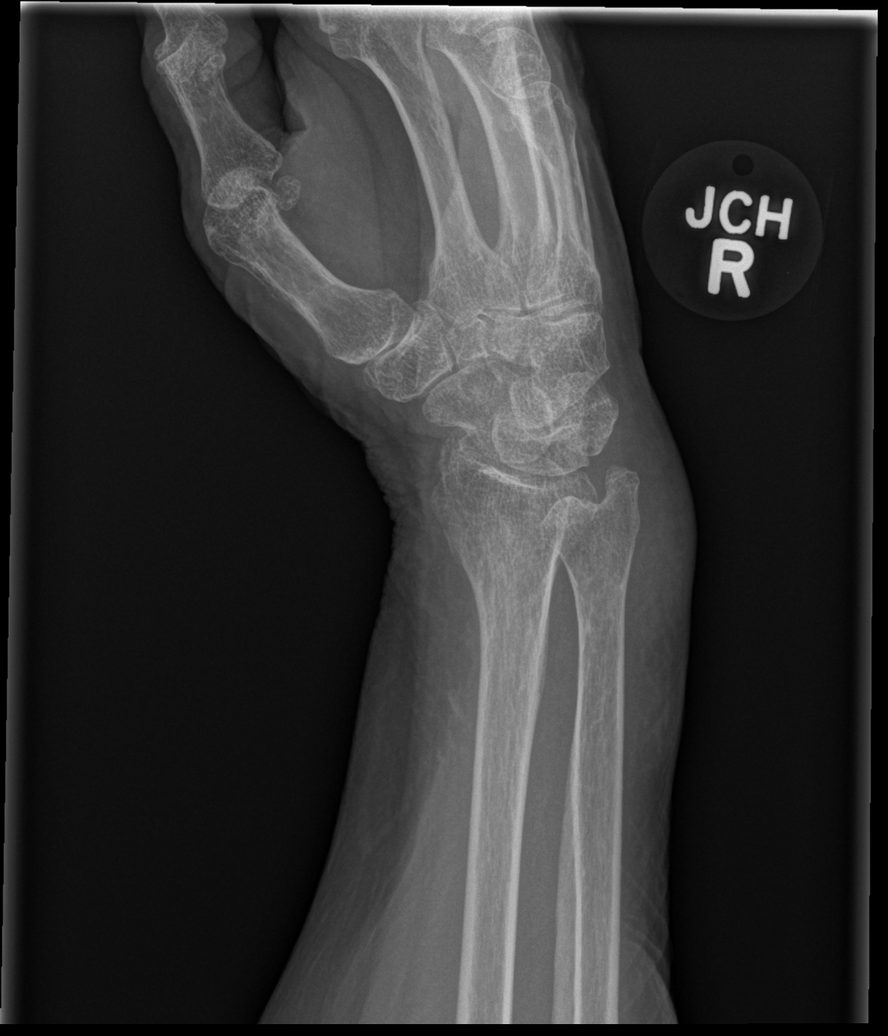

[x wrist lat right]
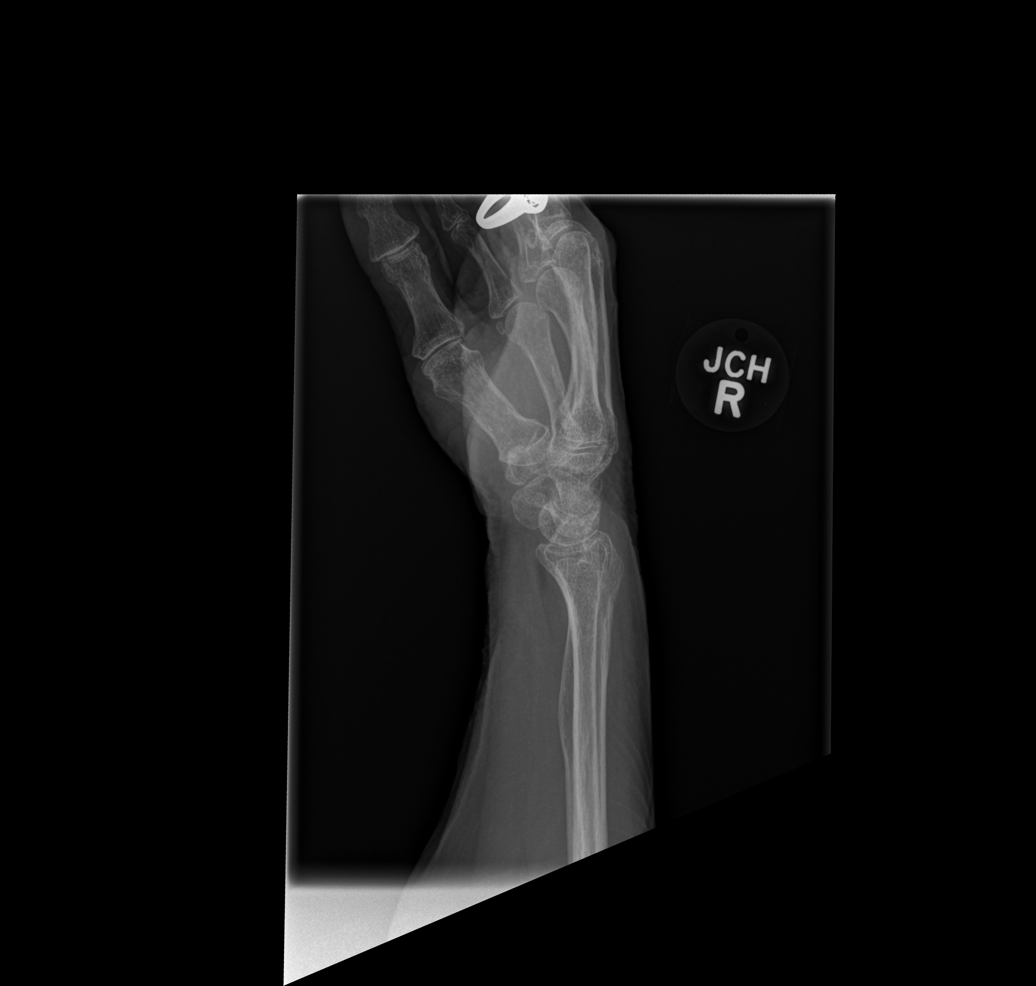

[x wrist navicular view right]
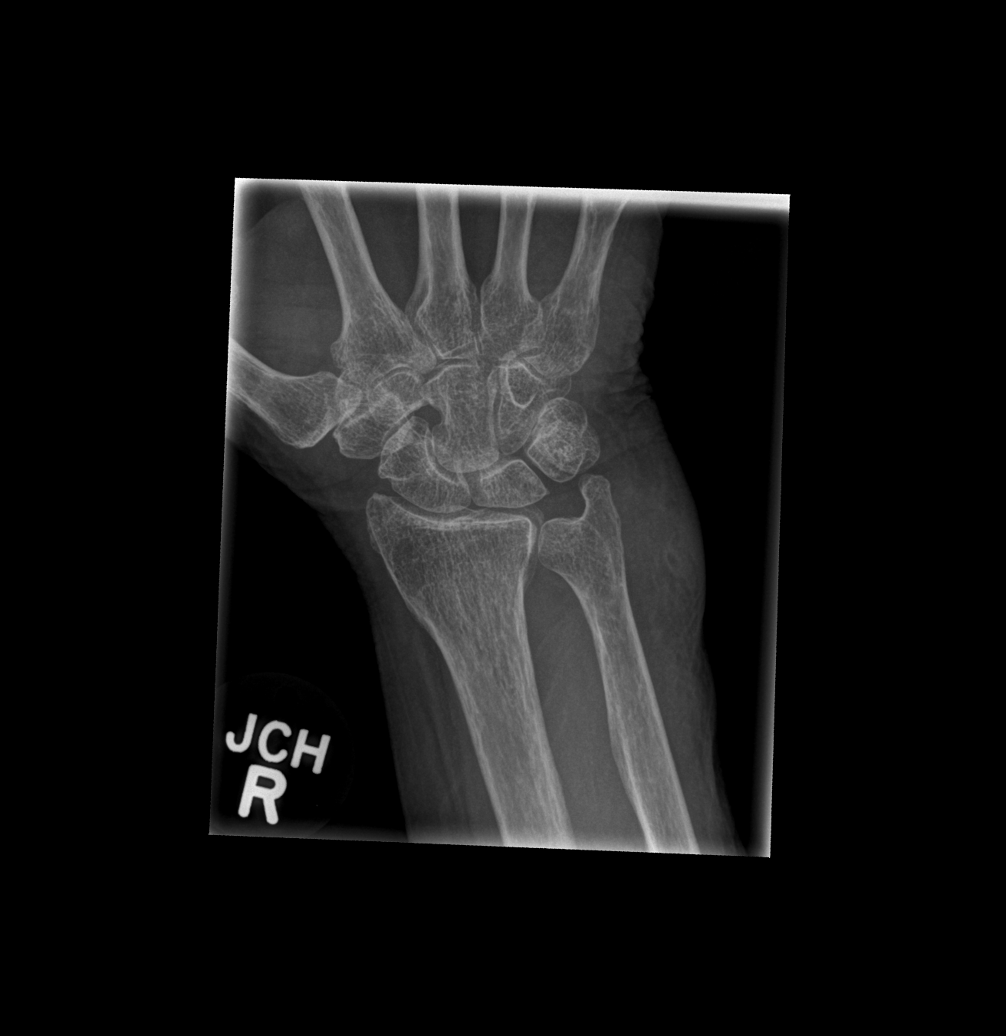

[4 of 4 positions shown; findings below may reference images not displayed]

FINDINGS: Normal alignment and no fracture.  No significant joint
space narrowing or erosion. Soft tissue swelling.  There is
displacement of  the pronator fat pad compatible with effusion.
IMPRESSION: Wrist effusion without fracture.

## 2015-05-19 ENCOUNTER — Encounter: Payer: Self-pay | Admitting: Internal Medicine

## 2016-07-31 ENCOUNTER — Encounter (HOSPITAL_COMMUNITY): Payer: Self-pay | Admitting: *Deleted

## 2016-07-31 ENCOUNTER — Emergency Department (HOSPITAL_COMMUNITY): Payer: Medicare Other

## 2016-07-31 ENCOUNTER — Emergency Department (HOSPITAL_COMMUNITY)
Admission: EM | Admit: 2016-07-31 | Discharge: 2016-07-31 | Disposition: A | Discharge status: Home / Self Care | Payer: Medicare Other | Attending: Emergency Medicine | Admitting: Emergency Medicine

## 2016-07-31 DIAGNOSIS — Z79899 Other long term (current) drug therapy: Secondary | ICD-10-CM | POA: Diagnosis not present

## 2016-07-31 DIAGNOSIS — I10 Essential (primary) hypertension: Secondary | ICD-10-CM | POA: Insufficient documentation

## 2016-07-31 DIAGNOSIS — I2510 Atherosclerotic heart disease of native coronary artery without angina pectoris: Secondary | ICD-10-CM | POA: Diagnosis not present

## 2016-07-31 DIAGNOSIS — Y999 Unspecified external cause status: Secondary | ICD-10-CM | POA: Diagnosis not present

## 2016-07-31 DIAGNOSIS — R51 Headache: Secondary | ICD-10-CM | POA: Insufficient documentation

## 2016-07-31 DIAGNOSIS — W1800XA Striking against unspecified object with subsequent fall, initial encounter: Secondary | ICD-10-CM | POA: Insufficient documentation

## 2016-07-31 DIAGNOSIS — E119 Type 2 diabetes mellitus without complications: Secondary | ICD-10-CM | POA: Insufficient documentation

## 2016-07-31 DIAGNOSIS — M545 Low back pain: Secondary | ICD-10-CM | POA: Insufficient documentation

## 2016-07-31 DIAGNOSIS — F0390 Unspecified dementia without behavioral disturbance: Secondary | ICD-10-CM | POA: Insufficient documentation

## 2016-07-31 DIAGNOSIS — Y939 Activity, unspecified: Secondary | ICD-10-CM | POA: Insufficient documentation

## 2016-07-31 DIAGNOSIS — W19XXXA Unspecified fall, initial encounter: Secondary | ICD-10-CM

## 2016-07-31 DIAGNOSIS — Z7984 Long term (current) use of oral hypoglycemic drugs: Secondary | ICD-10-CM | POA: Insufficient documentation

## 2016-07-31 DIAGNOSIS — Y92009 Unspecified place in unspecified non-institutional (private) residence as the place of occurrence of the external cause: Secondary | ICD-10-CM | POA: Insufficient documentation

## 2016-07-31 NOTE — ED Notes (Signed)
MD at bedside. EDP JAMES PRESENT 

## 2016-07-31 NOTE — ED Triage Notes (Signed)
Pt denies any pain at this time, states she does not recall what happened this am.  Pt cannot remember whether she ate breakfast or not.  Pt has hx of dementia.  Pt is A&O per norm.

## 2016-07-31 NOTE — ED Notes (Signed)
Patient transported to CT 

## 2016-07-31 NOTE — ED Triage Notes (Signed)
PTAR reports, [facility].  Staff heard pt fall, was sitting on the floor.  Pt has dementia.  Pt reports buttocks pain.  Also reports pain in back of her head, no hematoma noted per PTAR.

## 2016-07-31 NOTE — Discharge Instructions (Signed)
No specific instructions. ° °Return to ER as needed °

## 2016-07-31 NOTE — ED Provider Notes (Addendum)
[facility]-EMERGENCY DEPT Provider Note   CSN: [id number] Arrival date & time: 07/31/16  0944     History   Chief Complaint Chief Complaint  Patient presents with  . Fall  . Rectal Pain    HPI [name] is a 82 y.o. female. She presents after a fall at her health care facility. She has a history of dementia. She presents here accompanied by her niece who is her power of attorney. He needs received a call that the patient had fallen and hit her head. Patient has no complaints. She is on Plavix for TIAs. No hip pain. She has ambulated since the fall. Has not been ill recently.  HPI  Past Medical History:  Diagnosis Date  . Anxiety   . Aspirin intolerance   . Bronchitis, mucopurulent recurrent (HCC)   . CAD (coronary artery disease)    2008, question slight anteroapical ischemia  . Carotid artery disease (HCC)    Doppler, December, 2007, bilateral 0-39%  . Dementia    Senile dementia with delusional features  . Diabetes mellitus   . DJD (degenerative joint disease)   . Ejection fraction    EF 70%, nuclear, 2007  . Esophageal stricture   . GERD (gastroesophageal reflux disease)   . Hematoma    Large hematoma from fall, November, 2009, Coumadin stopped  . Hemorrhoids   . Hiatal hernia   . Hx of CABG    2003, ... LIMA not used... small caliber  . Hyperlipidemia   . Hypertension   . IBS (irritable bowel syndrome)   . Nephrolithiasis   . Obesity   . Osteoporosis   . Palpitations   . Pulmonary embolism (HCC)   . Right knee injury   . Senile dementia with delusional features   . SOB (shortness of breath)   . Subclavian arterial stenosis (HCC)    PCI  . Warfarin anticoagulation    stopped 09/2008 with large hematoma from fall    Patient Active Problem List   Diagnosis Date Noted  . Unspecified vitamin D deficiency 02/18/2013  . Hand fracture 08/21/2012  . Hand pain 08/20/2012  . UTI (lower urinary tract infection) 10/27/2011  . Fall 10/27/2011  .  Hyperlipidemia 07/30/2011  . Hypertension   . Subclavian arterial stenosis (HCC)   . Diabetes mellitus (HCC)   . GERD (gastroesophageal reflux disease)   . Hx of CABG   . Carotid artery disease (HCC)   . Aspirin intolerance   . CAD (coronary artery disease)   . ESOPHAGEAL STRICTURE 03/01/2010  . DYSPHAGIA UNSPECIFIED 03/01/2010  . SENILE DEMENTIA WITH DELUSIONAL FEATURES 09/16/2008  . PERIPHERAL VASCULAR DISEASE 12/18/2007  . BRONCHITIS, RECURRENT 12/18/2007  . NEPHROLITHIASIS 12/18/2007  . OSTEOPOROSIS 12/18/2007  . Nonspecific (abnormal) findings on radiological and other examination of body structure 12/18/2007  . ABNORMAL CHEST XRAY 12/18/2007  . ANXIETY 11/10/2007  . IBS 11/10/2007  . DEGENERATIVE JOINT DISEASE 11/10/2007    Past Surgical History:  Procedure Laterality Date  . ABDOMINAL HYSTERECTOMY    . APPENDECTOMY    . cabg  1 vessel w/SVG to LAD  2003  . CATARACT EXTRACTION    . FRACTURE SURGERY    . HEMORRHOID SURGERY    . mult orthopedic procedures     Dr. Arnette Norris arm, CTS,cyst removal  . open heart surgery      OB History    No data available       Home Medications    Prior to Admission medications  Medication Sig Start Date End Date Taking? Authorizing Provider  acetaminophen (TYLENOL) 325 MG tablet Take 650 mg by mouth every 6 (six) hours as needed for mild pain.   Yes Historical Provider, MD  atorvastatin (LIPITOR) 20 MG tablet Take 20 mg by mouth daily.   Yes Historical Provider, MD  bumetanide (BUMEX) 0.5 MG tablet Take 1 tablet (0.5 mg total) by mouth daily. 02/17/13  Yes Michele Mcalpine, MD  Calcium 600-200 MG-UNIT tablet Take 1 tablet by mouth 2 (two) times daily.   Yes Historical Provider, MD  chlorhexidine (PERIDEX) 0.12 % solution Use as directed 15 mLs in the mouth or throat 2 (two) times daily.   Yes Historical Provider, MD  clopidogrel (PLAVIX) 75 MG tablet TAKE 1 TABLET BY MOUTH EVERY DAY 10/16/11  Yes Luis Abed, MD  famotidine  (PEPCID) 20 MG tablet Take 20 mg by mouth daily.   Yes Historical Provider, MD  glimepiride (AMARYL) 1 MG tablet Take 1/2 tablet by mouth once daily 06/18/11  Yes Michele Mcalpine, MD  ibandronate (BONIVA) 150 MG tablet Take 1 tablet (150 mg total) by mouth every 30 (thirty) days. Take in the morning with a full glass of water, on an empty stomach, and do not take anything else by mouth or lie down for the next 30 min. 06/18/11  Yes Michele Mcalpine, MD  losartan (COZAAR) 50 MG tablet Take 50 mg by mouth every evening.    Yes Historical Provider, MD  metFORMIN (GLUCOPHAGE) 1000 MG tablet Take 1,000 mg by mouth 2 (two) times daily with a meal.   Yes Historical Provider, MD  metoprolol succinate (TOPROL-XL) 25 MG 24 hr tablet Take 25 mg by mouth daily.   Yes Historical Provider, MD  QUEtiapine (SEROQUEL) 25 MG tablet Take 0.5 tablets (12.5 mg total) by mouth at bedtime. 12/25/12  Yes Michele Mcalpine, MD  vitamin B-12 (CYANOCOBALAMIN) 1000 MCG tablet Take 1,000 mcg by mouth daily.   Yes Historical Provider, MD    Family History Family History  Problem Relation Age of Onset  . Stroke Father   . Cancer Mother     bladder cancer  . Hypertension Mother   . Stroke Mother   . Leukemia Sister   . Colon cancer Neg Hx     Social History Social History  Substance Use Topics  . Smoking status: Never Smoker  . Smokeless tobacco: Never Used  . Alcohol use No     Allergies   Aspirin; Iodine; Irbesartan-hydrochlorothiazide; and Penicillins   Review of Systems Review of Systems  Unable to perform ROS: Dementia     Physical Exam Updated Vital Signs BP 154/69   Pulse 73   Temp 98.7 F (37.1 C)   Resp 16   SpO2 96%   Physical Exam  Constitutional: She appears well-developed and well-nourished. No distress.  HENT:  Head: Normocephalic. Head is without raccoon's eyes.  No areas of tenderness. No soft tissue swelling. No lacerations. No blood over the TMs, mastoids, or from ears nose or mouth.  Nontender over the spine.  Eyes: Conjunctivae are normal. Pupils are equal, round, and reactive to light. No scleral icterus.  Neck: Normal range of motion. Neck supple. No thyromegaly present.  Cardiovascular: Normal rate and regular rhythm.  Exam reveals no gallop and no friction rub.   No murmur heard. Pulmonary/Chest: Effort normal and breath sounds normal. No respiratory distress. She has no wheezes. She has no rales.  Abdominal: Soft. Bowel sounds are normal.  She exhibits no distension. There is no tenderness. There is no rebound.  Musculoskeletal: Normal range of motion.  Range of motion of the upper extremities. Full range of motion of hips. Nontender over the pelvis.  Neurological: She is alert.  Skin: Skin is warm and dry. No rash noted.  Psychiatric: She has a normal mood and affect. Her behavior is normal.     ED Treatments / Results  Labs (all labs ordered are listed, but only abnormal results are displayed) Labs Reviewed - No data to display  EKG  EKG Interpretation None       Radiology Ct Head Wo Contrast  Result Date: 07/31/2016 CLINICAL DATA:  82 year old female status post fall on Plavix. Posterior head pain. Initial encounter. EXAM: CT HEAD WITHOUT CONTRAST TECHNIQUE: Contiguous axial images were obtained from the base of the skull through the vertex without intravenous contrast. COMPARISON:  Head and cervical spine CT 11/17/2011 and earlier. FINDINGS: Brain: Mild generalized decreased cerebral volume since 2013. No midline shift, ventriculomegaly, mass effect, evidence of mass lesion, intracranial hemorrhage or evidence of cortically based acute infarction. Gray-white matter differentiation is within normal limits throughout the brain. Vascular: Calcified atherosclerosis at the skull base. No suspicious intracranial vascular hyperdensity. Skull: No acute osseous abnormality identified. Sinuses/Orbits: Clear. Interval resolved left maxillary fluid level. Other: No  scalp hematoma. No acute orbit or scalp soft tissue finding identified. IMPRESSION: Negative for age non contrast CT appearance of the brain. No acute traumatic injury identified. Electronically Signed   By: Odessa FlemingH  Hall M.D.   On: 07/31/2016 11:43    Procedures Procedures (including critical care time)  Medications Ordered in ED Medications - No data to display   Initial Impression / Assessment and Plan / ED Course  I have reviewed the triage vital signs and the nursing notes.  Pertinent labs & imaging results that were available during my care of the patient were reviewed by me and considered in my medical decision making (see chart for details).  Clinical Course    CT scan of the head obtained as patient on Plavix. Normal. Plan discharge home. Fall precautions.  Final Clinical Impressions(s) / ED Diagnoses   Final diagnoses:  Fall at home, initial encounter    New Prescriptions New Prescriptions   No medications on file     Rolland PorterMark Arlita Buffkin, MD 07/31/16 1202    Rolland PorterMark Ayumi Wangerin, MD 07/31/16 [phone]

## 2017-05-13 ENCOUNTER — Emergency Department (HOSPITAL_COMMUNITY): Payer: Medicare Other

## 2017-05-13 ENCOUNTER — Emergency Department (HOSPITAL_COMMUNITY)
Admission: EM | Admit: 2017-05-13 | Discharge: 2017-05-13 | Disposition: A | Discharge status: Home / Self Care | Payer: Medicare Other | Attending: Emergency Medicine | Admitting: Emergency Medicine

## 2017-05-13 ENCOUNTER — Encounter (HOSPITAL_COMMUNITY): Payer: Self-pay | Admitting: Emergency Medicine

## 2017-05-13 DIAGNOSIS — Z7984 Long term (current) use of oral hypoglycemic drugs: Secondary | ICD-10-CM | POA: Diagnosis not present

## 2017-05-13 DIAGNOSIS — I119 Hypertensive heart disease without heart failure: Secondary | ICD-10-CM | POA: Diagnosis not present

## 2017-05-13 DIAGNOSIS — I2510 Atherosclerotic heart disease of native coronary artery without angina pectoris: Secondary | ICD-10-CM | POA: Diagnosis not present

## 2017-05-13 DIAGNOSIS — Z7902 Long term (current) use of antithrombotics/antiplatelets: Secondary | ICD-10-CM | POA: Insufficient documentation

## 2017-05-13 DIAGNOSIS — R079 Chest pain, unspecified: Secondary | ICD-10-CM | POA: Diagnosis present

## 2017-05-13 DIAGNOSIS — Z880 Allergy status to penicillin: Secondary | ICD-10-CM | POA: Diagnosis not present

## 2017-05-13 DIAGNOSIS — Z951 Presence of aortocoronary bypass graft: Secondary | ICD-10-CM | POA: Diagnosis not present

## 2017-05-13 DIAGNOSIS — Z79899 Other long term (current) drug therapy: Secondary | ICD-10-CM | POA: Insufficient documentation

## 2017-05-13 DIAGNOSIS — R072 Precordial pain: Secondary | ICD-10-CM | POA: Diagnosis not present

## 2017-05-13 DIAGNOSIS — E119 Type 2 diabetes mellitus without complications: Secondary | ICD-10-CM | POA: Insufficient documentation

## 2017-05-13 DIAGNOSIS — F0390 Unspecified dementia without behavioral disturbance: Secondary | ICD-10-CM | POA: Diagnosis not present

## 2017-05-13 LAB — CBC WITH DIFFERENTIAL/PLATELET
Basophils Absolute: 0 10*3/uL (ref 0.0–0.1)
Basophils Relative: 0 %
Eosinophils Absolute: 0.5 10*3/uL (ref 0.0–0.7)
Eosinophils Relative: 5 %
HCT: 39.5 % (ref 36.0–46.0)
HEMOGLOBIN: 12.5 g/dL (ref 12.0–15.0)
LYMPHS ABS: 2.9 10*3/uL (ref 0.7–4.0)
LYMPHS PCT: 29 %
MCH: 30.6 pg (ref 26.0–34.0)
MCHC: 31.6 g/dL (ref 30.0–36.0)
MCV: 96.6 fL (ref 78.0–100.0)
Monocytes Absolute: 0.8 10*3/uL (ref 0.1–1.0)
Monocytes Relative: 8 %
NEUTROS PCT: 58 %
Neutro Abs: 5.6 10*3/uL (ref 1.7–7.7)
Platelets: 161 10*3/uL (ref 150–400)
RBC: 4.09 MIL/uL (ref 3.87–5.11)
RDW: 12.9 % (ref 11.5–15.5)
WBC: 9.8 10*3/uL (ref 4.0–10.5)

## 2017-05-13 LAB — COMPREHENSIVE METABOLIC PANEL
ALT: 14 U/L (ref 14–54)
ANION GAP: 9 (ref 5–15)
AST: 19 U/L (ref 15–41)
Albumin: 3.3 g/dL — ABNORMAL LOW (ref 3.5–5.0)
Alkaline Phosphatase: 36 U/L — ABNORMAL LOW (ref 38–126)
BUN: 16 mg/dL (ref 6–20)
CHLORIDE: 104 mmol/L (ref 101–111)
CO2: 24 mmol/L (ref 22–32)
Calcium: 8.9 mg/dL (ref 8.9–10.3)
Creatinine, Ser: 0.82 mg/dL (ref 0.44–1.00)
Glucose, Bld: 89 mg/dL (ref 65–99)
POTASSIUM: 4.2 mmol/L (ref 3.5–5.1)
Sodium: 137 mmol/L (ref 135–145)
Total Bilirubin: 0.5 mg/dL (ref 0.3–1.2)
Total Protein: 6.2 g/dL — ABNORMAL LOW (ref 6.5–8.1)

## 2017-05-13 LAB — I-STAT TROPONIN, ED: TROPONIN I, POC: 0.01 ng/mL (ref 0.00–0.08)

## 2017-05-13 NOTE — ED Notes (Signed)
Pt verbalized understanding discharge instructions and denies any further needs or questions at this time. VS stable, ambulatory and steady gait.   

## 2017-05-13 NOTE — Discharge Instructions (Signed)
Workup for the chest pain without any acute findings. Patient denies any chest pain here. Troponins were negative 2. Labs without significant abnormality. Follow-up with her doctor as needed. Return as needed.

## 2017-05-13 NOTE — ED Provider Notes (Signed)
[facility]-EMERGENCY DEPT Provider Note   CSN: [id number] Arrival date & time: 05/13/17  1832     History   Chief Complaint Chief Complaint  Patient presents with  . Chest Pain    HPI [name] is a 83 y.o. female.  Patient from nursing facility. The EMS stated patient complaint chest pain since 12 noon. Family states that they were notified more around 5 PM. Patient denies any complaints here at all. Patient appears to be nontoxic no acute distress. Patient has a history of dementia. Patient had a CABG single-vessel done in 2003 no stents.      Past Medical History:  Diagnosis Date  . Anxiety   . Aspirin intolerance   . Bronchitis, mucopurulent recurrent (HCC)   . CAD (coronary artery disease)    2008, question slight anteroapical ischemia  . Carotid artery disease (HCC)    Doppler, December, 2007, bilateral 0-39%  . Dementia    Senile dementia with delusional features  . Diabetes mellitus   . DJD (degenerative joint disease)   . Ejection fraction    EF 70%, nuclear, 2007  . Esophageal stricture   . GERD (gastroesophageal reflux disease)   . Hematoma    Large hematoma from fall, November, 2009, Coumadin stopped  . Hemorrhoids   . Hiatal hernia   . Hx of CABG    2003, ... LIMA not used... small caliber  . Hyperlipidemia   . Hypertension   . IBS (irritable bowel syndrome)   . Nephrolithiasis   . Obesity   . Osteoporosis   . Palpitations   . Pulmonary embolism (HCC)   . Right knee injury   . Senile dementia with delusional features (HCC)   . SOB (shortness of breath)   . Subclavian arterial stenosis (HCC)    PCI  . Warfarin anticoagulation    stopped 09/2008 with large hematoma from fall    Patient Active Problem List   Diagnosis Date Noted  . Unspecified vitamin D deficiency 02/18/2013  . Hand fracture 08/21/2012  . Hand pain 08/20/2012  . UTI (lower urinary tract infection) 10/27/2011  . Fall 10/27/2011  . Hyperlipidemia 07/30/2011  .  Hypertension   . Subclavian arterial stenosis (HCC)   . Diabetes mellitus (HCC)   . GERD (gastroesophageal reflux disease)   . Hx of CABG   . Carotid artery disease (HCC)   . Aspirin intolerance   . CAD (coronary artery disease)   . ESOPHAGEAL STRICTURE 03/01/2010  . DYSPHAGIA UNSPECIFIED 03/01/2010  . SENILE DEMENTIA WITH DELUSIONAL FEATURES 09/16/2008  . PERIPHERAL VASCULAR DISEASE 12/18/2007  . BRONCHITIS, RECURRENT 12/18/2007  . NEPHROLITHIASIS 12/18/2007  . OSTEOPOROSIS 12/18/2007  . Nonspecific (abnormal) findings on radiological and other examination of body structure 12/18/2007  . ABNORMAL CHEST XRAY 12/18/2007  . ANXIETY 11/10/2007  . IBS 11/10/2007  . DEGENERATIVE JOINT DISEASE 11/10/2007    Past Surgical History:  Procedure Laterality Date  . ABDOMINAL HYSTERECTOMY    . APPENDECTOMY    . cabg  1 vessel w/SVG to LAD  2003  . CATARACT EXTRACTION    . FRACTURE SURGERY    . HEMORRHOID SURGERY    . mult orthopedic procedures     Dr. Arnette NorrisWeingold--l arm, CTS,cyst removal  . open heart surgery      OB History    No data available       Home Medications    Prior to Admission medications   Medication Sig Start Date End Date Taking? Authorizing Provider  acetaminophen (  TYLENOL) 325 MG tablet Take 650 mg by mouth every 6 (six) hours as needed for mild pain.    [provider]  atorvastatin (LIPITOR) 20 MG tablet Take 20 mg by mouth daily.    [provider]  bumetanide (BUMEX) 0.5 MG tablet Take 1 tablet (0.5 mg total) by mouth daily. 02/17/13   Michele Mcalpine, MD  Calcium 600-200 MG-UNIT tablet Take 1 tablet by mouth 2 (two) times daily.    [provider]  chlorhexidine (PERIDEX) 0.12 % solution Use as directed 15 mLs in the mouth or throat 2 (two) times daily.    [provider]  clopidogrel (PLAVIX) 75 MG tablet TAKE 1 TABLET BY MOUTH EVERY DAY 10/16/11   Luis Abed, MD  famotidine (PEPCID) 20 MG tablet Take 20 mg by mouth  daily.    [provider]  glimepiride (AMARYL) 1 MG tablet Take 1/2 tablet by mouth once daily 06/18/11   Michele Mcalpine, MD  ibandronate (BONIVA) 150 MG tablet Take 1 tablet (150 mg total) by mouth every 30 (thirty) days. Take in the morning with a full glass of water, on an empty stomach, and do not take anything else by mouth or lie down for the next 30 min. 06/18/11   Michele Mcalpine, MD  losartan (COZAAR) 50 MG tablet Take 50 mg by mouth every evening.     [provider]  metFORMIN (GLUCOPHAGE) 1000 MG tablet Take 1,000 mg by mouth 2 (two) times daily with a meal.    [provider]  metoprolol succinate (TOPROL-XL) 25 MG 24 hr tablet Take 25 mg by mouth daily.    [provider]  QUEtiapine (SEROQUEL) 25 MG tablet Take 0.5 tablets (12.5 mg total) by mouth at bedtime. 12/25/12   Michele Mcalpine, MD  vitamin B-12 (CYANOCOBALAMIN) 1000 MCG tablet Take 1,000 mcg by mouth daily.    [provider]    Family History Family History  Problem Relation Age of Onset  . Stroke Father   . Cancer Mother        bladder cancer  . Hypertension Mother   . Stroke Mother   . Leukemia Sister   . Colon cancer Neg Hx     Social History Social History  Substance Use Topics  . Smoking status: Never Smoker  . Smokeless tobacco: Never Used  . Alcohol use No     Allergies   Aspirin; Iodine; Irbesartan-hydrochlorothiazide; and Penicillins   Review of Systems Review of Systems  Unable to perform ROS: Dementia     Physical Exam Updated Vital Signs BP (!) 155/72   Pulse (!) 57   Temp 97.6 F (36.4 C) (Oral)   Resp 17   Wt 74.8 kg (165 lb)   SpO2 96%   BMI 30.18 kg/m   Physical Exam  Constitutional: She appears well-developed and well-nourished. No distress.  HENT:  Head: Normocephalic and atraumatic.  Mouth/Throat: Oropharynx is clear and moist.  Eyes: Conjunctivae and EOM are normal. Pupils are equal, round, and reactive to light.  Neck:  Normal range of motion. Neck supple.  Cardiovascular: Normal rate and regular rhythm.   Pulmonary/Chest: Effort normal and breath sounds normal. No respiratory distress.  Abdominal: Soft. Bowel sounds are normal.  Musculoskeletal: Normal range of motion. She exhibits no edema.  Neurological: She is alert. She displays normal reflexes. No cranial nerve deficit or sensory deficit. She exhibits normal muscle tone.  Skin: Skin is warm.  Nursing note and  vitals reviewed.    ED Treatments / Results  Labs (all labs ordered are listed, but only abnormal results are displayed) Labs Reviewed  COMPREHENSIVE METABOLIC PANEL - Abnormal; Notable for the following:       Result Value   Total Protein 6.2 (*)    Albumin 3.3 (*)    Alkaline Phosphatase 36 (*)    All other components within normal limits  CBC WITH DIFFERENTIAL/PLATELET  Rosezena Sensor, ED    EKG  EKG Interpretation  Date/Time:  Monday May 13 2017 18:42:11 EDT Ventricular Rate:  60 PR Interval:    QRS Duration: 95 QT Interval:  412 QTC Calculation: 412 R Axis:   151 Text Interpretation:  Sinus or ectopic atrial rhythm Prolonged PR interval Right axis deviation Low voltage, precordial leads Probable anteroseptal infarct, old Confirmed by Vanetta Mulders [phone]) on 05/13/2017 6:54:07 PM       Radiology Dg Chest 2 View  Result Date: 05/13/2017 CLINICAL DATA:  Shortness of breath and left chest pain for 1 week EXAM: CHEST  2 VIEW COMPARISON:  November 01, 2011 FINDINGS: The heart size and mediastinal contours are stable. The heart size is enlarged. Patient status post prior median sternotomy and coronary artery stent. Both lungs are clear. The visualized skeletal structures are stable. IMPRESSION: No active cardiopulmonary disease. Electronically Signed   By: Sherian Rein M.D.   On: 05/13/2017 20:27    Procedures Procedures (including critical care time)  Medications Ordered in ED Medications - No data to  display   Initial Impression / Assessment and Plan / ED Course  I have reviewed the triage vital signs and the nursing notes.  Pertinent labs & imaging results that were available during my care of the patient were reviewed by me and considered in my medical decision making (see chart for details).     Patient here with the workup for presumed chest pain patient has denied any at all. But she does have dementia. Troponins 2 is been negative. EKG without acute changes chest x-ray negative basic labs without significant abnormality. Patient's appeared to be in no distress here nontoxic patient has had no pain while she's been with [place]. Family has been sitting with her. Patient stable for discharge back to nursing facility.  Final Clinical Impressions(s) / ED Diagnoses   Final diagnoses:  Precordial pain    New Prescriptions New Prescriptions   No medications on file     Vanetta Mulders, MD 05/13/17 2155

## 2017-05-13 NOTE — ED Triage Notes (Signed)
Per EMS:   Pt had a brief period of sharp CP at 1200.  Pt now only c/o of right shoulder soreness. No dizziness, SOB, or N/V. A&Ox4 EKG NSR 126/84  102 HR 16 RR 97% RA CBG 88%  Pt given 324 of ASP

## 2017-07-12 ENCOUNTER — Emergency Department (HOSPITAL_COMMUNITY)
Admission: EM | Admit: 2017-07-12 | Discharge: 2017-07-13 | Disposition: A | Discharge status: Home / Self Care | Payer: Medicare Other | Attending: Emergency Medicine | Admitting: Emergency Medicine

## 2017-07-12 ENCOUNTER — Encounter (HOSPITAL_COMMUNITY): Payer: Self-pay | Admitting: Nurse Practitioner

## 2017-07-12 ENCOUNTER — Emergency Department (HOSPITAL_COMMUNITY): Payer: Medicare Other

## 2017-07-12 DIAGNOSIS — M25511 Pain in right shoulder: Secondary | ICD-10-CM | POA: Insufficient documentation

## 2017-07-12 DIAGNOSIS — Z951 Presence of aortocoronary bypass graft: Secondary | ICD-10-CM | POA: Diagnosis not present

## 2017-07-12 DIAGNOSIS — R079 Chest pain, unspecified: Secondary | ICD-10-CM | POA: Diagnosis not present

## 2017-07-12 DIAGNOSIS — E119 Type 2 diabetes mellitus without complications: Secondary | ICD-10-CM | POA: Diagnosis not present

## 2017-07-12 DIAGNOSIS — Z7902 Long term (current) use of antithrombotics/antiplatelets: Secondary | ICD-10-CM | POA: Insufficient documentation

## 2017-07-12 DIAGNOSIS — I2510 Atherosclerotic heart disease of native coronary artery without angina pectoris: Secondary | ICD-10-CM | POA: Diagnosis not present

## 2017-07-12 DIAGNOSIS — Z7984 Long term (current) use of oral hypoglycemic drugs: Secondary | ICD-10-CM | POA: Insufficient documentation

## 2017-07-12 DIAGNOSIS — Z79899 Other long term (current) drug therapy: Secondary | ICD-10-CM | POA: Insufficient documentation

## 2017-07-12 DIAGNOSIS — F0390 Unspecified dementia without behavioral disturbance: Secondary | ICD-10-CM | POA: Diagnosis not present

## 2017-07-12 DIAGNOSIS — I10 Essential (primary) hypertension: Secondary | ICD-10-CM | POA: Insufficient documentation

## 2017-07-12 DIAGNOSIS — W19XXXA Unspecified fall, initial encounter: Secondary | ICD-10-CM

## 2017-07-12 NOTE — Discharge Instructions (Signed)
You may take Tylenol every 6 hours as needed for pain. Do not exceed 4000 mg of Tylenol daily. Apply ice or heat to the affected areas for comfort. Follow-up with primary care physician for reevaluation in the next few days. Return to the ED immediately if any concerning signs or symptoms develop.

## 2017-07-12 NOTE — ED Notes (Signed)
Bed: [id number] Expected date:  Expected time:  Means of arrival:  Comments: 83 yo F  Assisted living  Fall  Right shoulder pain

## 2017-07-12 NOTE — ED Triage Notes (Signed)
Pt is presented from [facility] for evaluation post unwitnessed fall, c/o of right shoulder pain, no reported head impact, anticoagulation, LOC or change in baseline cognition.

## 2017-07-12 NOTE — ED Provider Notes (Signed)
[facility]-EMERGENCY DEPT Provider Note   CSN: [id number] Arrival date & time: 07/12/17  2107     History   Chief Complaint Chief Complaint  Patient presents with  . Fall  . Shoulder Pain   Level V caveat due to dementia  HPI [name] is a 83 y.o. female with history of dementia, anxiety, CAD, DJD, DM, GERD, osteoporosis who presents today brought in from her nursing home for evaluation of an unwitnessed fall. Triage note states she is complaining of right shoulder pain. She is on Plavix for TIAs. No complaints on my examination   The history is provided by the patient. The history is limited by the condition of the patient.    Past Medical History:  Diagnosis Date  . Anxiety   . Aspirin intolerance   . Bronchitis, mucopurulent recurrent (HCC)   . CAD (coronary artery disease)    2008, question slight anteroapical ischemia  . Carotid artery disease (HCC)    Doppler, December, 2007, bilateral 0-39%  . Dementia    Senile dementia with delusional features  . Diabetes mellitus   . DJD (degenerative joint disease)   . Ejection fraction    EF 70%, nuclear, 2007  . Esophageal stricture   . GERD (gastroesophageal reflux disease)   . Hematoma    Large hematoma from fall, November, 2009, Coumadin stopped  . Hemorrhoids   . Hiatal hernia   . Hx of CABG    2003, ... LIMA not used... small caliber  . Hyperlipidemia   . Hypertension   . IBS (irritable bowel syndrome)   . Nephrolithiasis   . Obesity   . Osteoporosis   . Palpitations   . Pulmonary embolism (HCC)   . Right knee injury   . Senile dementia with delusional features (HCC)   . SOB (shortness of breath)   . Subclavian arterial stenosis (HCC)    PCI  . Warfarin anticoagulation    stopped 09/2008 with large hematoma from fall    Patient Active Problem List   Diagnosis Date Noted  . Unspecified vitamin D deficiency 02/18/2013  . Hand fracture 08/21/2012  . Hand pain 08/20/2012  . UTI (lower urinary tract  infection) 10/27/2011  . Fall 10/27/2011  . Hyperlipidemia 07/30/2011  . Hypertension   . Subclavian arterial stenosis (HCC)   . Diabetes mellitus (HCC)   . GERD (gastroesophageal reflux disease)   . Hx of CABG   . Carotid artery disease (HCC)   . Aspirin intolerance   . CAD (coronary artery disease)   . ESOPHAGEAL STRICTURE 03/01/2010  . DYSPHAGIA UNSPECIFIED 03/01/2010  . SENILE DEMENTIA WITH DELUSIONAL FEATURES 09/16/2008  . PERIPHERAL VASCULAR DISEASE 12/18/2007  . BRONCHITIS, RECURRENT 12/18/2007  . NEPHROLITHIASIS 12/18/2007  . OSTEOPOROSIS 12/18/2007  . Nonspecific (abnormal) findings on radiological and other examination of body structure 12/18/2007  . ABNORMAL CHEST XRAY 12/18/2007  . ANXIETY 11/10/2007  . IBS 11/10/2007  . DEGENERATIVE JOINT DISEASE 11/10/2007    Past Surgical History:  Procedure Laterality Date  . ABDOMINAL HYSTERECTOMY    . APPENDECTOMY    . cabg  1 vessel w/SVG to LAD  2003  . CATARACT EXTRACTION    . FRACTURE SURGERY    . HEMORRHOID SURGERY    . mult orthopedic procedures     Dr. Arnette NorrisWeingold--l arm, CTS,cyst removal  . open heart surgery      OB History    No data available       Home Medications    Prior  to Admission medications   Medication Sig Start Date End Date Taking? Authorizing Provider  acetaminophen (TYLENOL) 325 MG tablet Take 650 mg by mouth every 6 (six) hours as needed for mild pain.   Yes [provider]  atorvastatin (LIPITOR) 20 MG tablet Take 20 mg by mouth daily.   Yes [provider]  bumetanide (BUMEX) 0.5 MG tablet Take 1 tablet (0.5 mg total) by mouth daily. 02/17/13  Yes Michele Mcalpine, MD  Calcium 600-200 MG-UNIT tablet Take 1 tablet by mouth 2 (two) times daily.   Yes [provider]  chlorhexidine (PERIDEX) 0.12 % solution Use as directed 15 mLs in the mouth or throat 2 (two) times daily.   Yes [provider]  clopidogrel (PLAVIX) 75 MG tablet TAKE 1 TABLET BY MOUTH EVERY  DAY 10/16/11  Yes Luis Abed, MD  famotidine (PEPCID) 20 MG tablet Take 20 mg by mouth daily.   Yes [provider]  glimepiride (AMARYL) 1 MG tablet Take 1/2 tablet by mouth once daily 06/18/11  Yes Michele Mcalpine, MD  ibandronate (BONIVA) 150 MG tablet Take 1 tablet (150 mg total) by mouth every 30 (thirty) days. Take in the morning with a full glass of water, on an empty stomach, and do not take anything else by mouth or lie down for the next 30 min. 06/18/11  Yes Michele Mcalpine, MD  losartan (COZAAR) 50 MG tablet Take 50 mg by mouth every evening.    Yes [provider]  metFORMIN (GLUCOPHAGE) 1000 MG tablet Take 1,000 mg by mouth 2 (two) times daily with a meal.   Yes [provider]  metoprolol succinate (TOPROL-XL) 25 MG 24 hr tablet Take 25 mg by mouth daily.   Yes [provider]  QUEtiapine (SEROQUEL) 25 MG tablet Take 0.5 tablets (12.5 mg total) by mouth at bedtime. 12/25/12  Yes Michele Mcalpine, MD  vitamin B-12 (CYANOCOBALAMIN) 1000 MCG tablet Take 1,000 mcg by mouth daily.   Yes [provider]    Family History Family History  Problem Relation Age of Onset  . Stroke Father   . Cancer Mother        bladder cancer  . Hypertension Mother   . Stroke Mother   . Leukemia Sister   . Colon cancer Neg Hx     Social History Social History  Substance Use Topics  . Smoking status: Never Smoker  . Smokeless tobacco: Never Used  . Alcohol use No     Allergies   Aspirin; Iodine; Irbesartan-hydrochlorothiazide; and Penicillins   Review of Systems Review of Systems  Unable to perform ROS: Dementia     Physical Exam Updated Vital Signs BP (!) 152/74 (BP Location: Right Arm)   Pulse 75   Temp 98 F (36.7 C) (Oral)   Resp 18   SpO2 100%   Physical Exam  Constitutional: She appears well-developed and well-nourished. No distress.  Resting comfortably in bed  HENT:  Head: Normocephalic and atraumatic.  Right Ear: External  ear normal.  Left Ear: External ear normal.  Mouth/Throat: Oropharynx is clear and moist.  No Battle's signs, no raccoon's eyes, no rhinorrhea. No hemotympanum. No tenderness to palpation of the face or skull. No deformity, crepitus, or swelling noted.   Eyes: Pupils are equal, round, and reactive to light. Conjunctivae and EOM are normal. Right eye exhibits no discharge. Left eye exhibits no discharge.  Neck: Normal range of motion. Neck supple. No JVD present. No tracheal  deviation present.  No midline spine TTP, no paraspinal muscle tenderness, no deformity, crepitus, or step-off noted   Cardiovascular: Normal rate, regular rhythm, normal heart sounds and intact distal pulses.   2+ radial and DP/PT pulses bl, negative Homan's bl   Pulmonary/Chest: Effort normal and breath sounds normal. No respiratory distress. She has no wheezes. She has no rales. She exhibits tenderness.  Equal rise and fall of chest, no increased work of breathing, no paradoxical wall motion. Parasternal chest wall tender to palpation bilaterally  Abdominal: Soft. Bowel sounds are normal. She exhibits no distension. There is no tenderness.  Musculoskeletal: Normal range of motion. She exhibits tenderness. She exhibits no edema.  Mild tenderness to palpation of the right elbow generally without swelling, erythema, deformity, or crepitus. Normal range of motion of the joints with 5/5 strength of BUE and BLE major muscle groups. No midline spine TTP, no paraspinal muscle tenderness, no deformity, crepitus, or step-off noted.  Neurological: She is alert.  Mental Status:  Alert, and oriented to person and place only. Speech fluent without evidence of aphasia. Able to follow 2 step commands without difficulty.  Cranial Nerves:  II:  Peripheral visual fields grossly normal, pupils equal, round, reactive to light III,IV, VI: ptosis not present, extra-ocular motions intact bilaterally  V,VII: smile symmetric, facial light touch  sensation equal VIII: hearing grossly normal to voice  X: uvula elevates symmetrically  XI: bilateral shoulder shrug symmetric and strong XII: midline tongue extension without fassiculations Sensory: light touch normal in all extremities. Cerebellar: normal finger-to-nose with bilateral upper extremities Gait: normal gait and balance. Able to walk on toes and heels with ease.  CV: 2+ radial and DP/PT pulses   Skin: No erythema.  Psychiatric: She has a normal mood and affect. Her behavior is normal.  Nursing note and vitals reviewed.    ED Treatments / Results  Labs (all labs ordered are listed, but only abnormal results are displayed) Labs Reviewed - No data to display  EKG  EKG Interpretation None       Radiology Dg Chest 2 View  Result Date: 07/12/2017 CLINICAL DATA:  Unwitnessed fall.  Pain on palpation. EXAM: CHEST  2 VIEW COMPARISON:  Radiograph 05/13/2017 FINDINGS: Post median sternotomy. Unchanged heart size and mediastinal contours. Coronary stent. Aortic arch atherosclerosis. Vascular stent at the takeoff of the great vessels. No pulmonary edema, pneumothorax, focal airspace disease or pleural fluid. No acute osseous abnormalities are seen. Chronic minimal compression deformity at the thoracolumbar junction. IMPRESSION: No acute abnormality or evidence of acute traumatic injury. Electronically Signed   By: Rubye Oaks M.D.   On: 07/12/2017 23:17   Dg Shoulder Right  Result Date: 07/12/2017 CLINICAL DATA:  Unwitnessed fall at care facility. EXAM: RIGHT SHOULDER - 2+ VIEW COMPARISON:  None. FINDINGS: The humeral head is well-formed and located. The subacromial, glenohumeral and acromioclavicular joint spaces are intact. No destructive bony lesions. Soft tissue planes are non-suspicious. Status post median sternotomy for CABG with vascular stent, incompletely assessed. IMPRESSION: Negative. Electronically Signed   By: Awilda Metro M.D.   On: 07/12/2017 23:12   Dg  Elbow Complete Right  Result Date: 07/12/2017 CLINICAL DATA:  Unwitnessed fall at care facility. EXAM: RIGHT ELBOW - COMPLETE 3+ VIEW COMPARISON:  None. FINDINGS: No acute fracture deformity or dislocation. Chronic deformity of the radius and ulna, status post ulna ORIF with intact hardware. No residual fracture line. Osteopenia without destructive bony lesions. Soft tissue planes are nonsuspicious. IMPRESSION: No acute fracture deformity  or dislocation. Ulna ORIF without hardware failure. Electronically Signed   By: Awilda Metro M.D.   On: 07/12/2017 23:14   Ct Head Wo Contrast  Result Date: 07/12/2017 CLINICAL DATA:  Head trauma, minor, pt on anticoagulation. Unwitnessed fall. EXAM: CT HEAD WITHOUT CONTRAST TECHNIQUE: Contiguous axial images were obtained from the base of the skull through the vertex without intravenous contrast. COMPARISON:  Head CT 07/31/2016 FINDINGS: Brain: No acute hemorrhage or extra-axial fluid collection. Stable atrophy and chronic small vessel ischemia from prior exam. No evidence of acute infarct or acute ischemia. No hydrocephalus, the basilar cisterns are patent. No mass effect or midline shift. Vascular: Atherosclerosis of skullbase vasculature without hyperdense vessel or abnormal calcification. Skull: No skull fracture.  No focal lesion. Sinuses/Orbits: Paranasal sinuses and mastoid air cells are clear. The visualized orbits are unremarkable. Bilateral cataract resection. Other: None. IMPRESSION: 1. No acute intracranial abnormality.  No skull fracture. 2. Stable atrophy and small vessel ischemia. Electronically Signed   By: Rubye Oaks M.D.   On: 07/12/2017 22:43    Procedures Procedures (including critical care time)  Medications Ordered in ED Medications - No data to display   Initial Impression / Assessment and Plan / ED Course  I have reviewed the triage vital signs and the nursing notes.  Pertinent labs & imaging results that were available during my  care of the patient were reviewed by me and considered in my medical decision making (see chart for details).     Patient presents secondary to unwitnessed fall. She is at her mental status baseline. Afebrile, vital signs are stable. No complaints of pain on my examination. She is on a blood thinner for TIAs. No focal neurological deficits and examination isn't concerning for acute fracture, ICH, or SAH. CT head shows no acute intracranial abnormalities and no skull fracture but does show stable atrophy and small vessel ischemia. Remainder of imaging is negative. She is stable for discharge back to the nursing home. Discussed indications for return to the ED. Patient verbalized understanding of and agreement with plan and is stable for discharge at this time.  Final Clinical Impressions(s) / ED Diagnoses   Final diagnoses:  Fall, initial encounter    New Prescriptions New Prescriptions   No medications on file     Bennye Alm 07/12/17 2331    Nira Conn, MD 07/16/17 [phone]

## 2017-09-05 ENCOUNTER — Emergency Department (HOSPITAL_COMMUNITY): Payer: Medicare Other

## 2017-09-05 ENCOUNTER — Emergency Department (HOSPITAL_COMMUNITY)
Admission: EM | Admit: 2017-09-05 | Discharge: 2017-09-06 | Disposition: A | Discharge status: Skilled Nursing Facility | Payer: Medicare Other | Attending: Emergency Medicine | Admitting: Emergency Medicine

## 2017-09-05 ENCOUNTER — Encounter (HOSPITAL_COMMUNITY): Payer: Self-pay

## 2017-09-05 DIAGNOSIS — W19XXXA Unspecified fall, initial encounter: Secondary | ICD-10-CM | POA: Diagnosis not present

## 2017-09-05 DIAGNOSIS — E119 Type 2 diabetes mellitus without complications: Secondary | ICD-10-CM | POA: Insufficient documentation

## 2017-09-05 DIAGNOSIS — Z7901 Long term (current) use of anticoagulants: Secondary | ICD-10-CM | POA: Insufficient documentation

## 2017-09-05 DIAGNOSIS — I10 Essential (primary) hypertension: Secondary | ICD-10-CM | POA: Diagnosis not present

## 2017-09-05 DIAGNOSIS — Y92199 Unspecified place in other specified residential institution as the place of occurrence of the external cause: Secondary | ICD-10-CM | POA: Insufficient documentation

## 2017-09-05 DIAGNOSIS — Y999 Unspecified external cause status: Secondary | ICD-10-CM | POA: Diagnosis not present

## 2017-09-05 DIAGNOSIS — F0390 Unspecified dementia without behavioral disturbance: Secondary | ICD-10-CM | POA: Insufficient documentation

## 2017-09-05 DIAGNOSIS — S0003XA Contusion of scalp, initial encounter: Secondary | ICD-10-CM | POA: Insufficient documentation

## 2017-09-05 DIAGNOSIS — Z951 Presence of aortocoronary bypass graft: Secondary | ICD-10-CM | POA: Insufficient documentation

## 2017-09-05 DIAGNOSIS — Y939 Activity, unspecified: Secondary | ICD-10-CM | POA: Diagnosis not present

## 2017-09-05 DIAGNOSIS — Z7984 Long term (current) use of oral hypoglycemic drugs: Secondary | ICD-10-CM | POA: Diagnosis not present

## 2017-09-05 DIAGNOSIS — S0990XA Unspecified injury of head, initial encounter: Secondary | ICD-10-CM | POA: Diagnosis present

## 2017-09-05 DIAGNOSIS — I2510 Atherosclerotic heart disease of native coronary artery without angina pectoris: Secondary | ICD-10-CM | POA: Insufficient documentation

## 2017-09-05 NOTE — ED Triage Notes (Signed)
Patient with history of dementia, brought in by EMS [facility] with c/o fall, and left-sided forehead hematoma. Patient fell approx 30min prior to EMS arrival. [facility] patient is not on blood thinners, but patient paperwork states patient is on Plavix. Per report, unsure if patient had LOC. Pupils equal, C-spine cleared by EMS, patient at baseline neuro status per EMS.

## 2017-09-05 NOTE — ED Provider Notes (Signed)
[facility]-EMERGENCY DEPT Provider Note   CSN: [id number] Arrival date & time: 09/05/17  1928     History   Chief Complaint Chief Complaint  Patient presents with  . Fall    HPI PANZY BUBECK is a very pleasant 83 y.o. female.  With a history of dementia who presents via EMS for fall.  Patient apparently had a mechanical fall today at her facility.  She has a hematoma in the left parietal scalp.  Patient denies hitting her head.  Patient is on blood thinners.  No other deformities or complaints.  HPI  Past Medical History:  Diagnosis Date  . Anxiety   . Aspirin intolerance   . Bronchitis, mucopurulent recurrent (HCC)   . CAD (coronary artery disease)    2008, question slight anteroapical ischemia  . Carotid artery disease (HCC)    Doppler, December, 2007, bilateral 0-39%  . Dementia    Senile dementia with delusional features  . Diabetes mellitus   . DJD (degenerative joint disease)   . Ejection fraction    EF 70%, nuclear, 2007  . Esophageal stricture   . GERD (gastroesophageal reflux disease)   . Hematoma    Large hematoma from fall, November, 2009, Coumadin stopped  . Hemorrhoids   . Hiatal hernia   . Hx of CABG    2003, ... LIMA not used... small caliber  . Hyperlipidemia   . Hypertension   . IBS (irritable bowel syndrome)   . Nephrolithiasis   . Obesity   . Osteoporosis   . Palpitations   . Pulmonary embolism (HCC)   . Right knee injury   . Senile dementia with delusional features (HCC)   . SOB (shortness of breath)   . Subclavian arterial stenosis (HCC)    PCI  . Warfarin anticoagulation    stopped 09/2008 with large hematoma from fall    Patient Active Problem List   Diagnosis Date Noted  . Unspecified vitamin D deficiency 02/18/2013  . Hand fracture 08/21/2012  . Hand pain 08/20/2012  . UTI (lower urinary tract infection) 10/27/2011  . Fall 10/27/2011  . Hyperlipidemia 07/30/2011  . Hypertension   . Subclavian  arterial stenosis (HCC)   . Diabetes mellitus (HCC)   . GERD (gastroesophageal reflux disease)   . Hx of CABG   . Carotid artery disease (HCC)   . Aspirin intolerance   . CAD (coronary artery disease)   . ESOPHAGEAL STRICTURE 03/01/2010  . DYSPHAGIA UNSPECIFIED 03/01/2010  . SENILE DEMENTIA WITH DELUSIONAL FEATURES 09/16/2008  . PERIPHERAL VASCULAR DISEASE 12/18/2007  . BRONCHITIS, RECURRENT 12/18/2007  . NEPHROLITHIASIS 12/18/2007  . OSTEOPOROSIS 12/18/2007  . Nonspecific (abnormal) findings on radiological and other examination of body structure 12/18/2007  . ABNORMAL CHEST XRAY 12/18/2007  . ANXIETY 11/10/2007  . IBS 11/10/2007  . DEGENERATIVE JOINT DISEASE 11/10/2007    Past Surgical History:  Procedure Laterality Date  . ABDOMINAL HYSTERECTOMY    . APPENDECTOMY    . cabg  1 vessel w/SVG to LAD  2003  . CATARACT EXTRACTION    . FRACTURE SURGERY    . HEMORRHOID SURGERY    . mult orthopedic procedures     Dr. Arnette Norris arm, CTS,cyst removal  . open heart surgery      OB History    No data available       Home Medications    Prior to Admission medications   Medication Sig Start Date End Date Taking? Authorizing Provider  atorvastatin (LIPITOR) 20 MG  tablet Take 20 mg by mouth daily.   Yes [provider]  bumetanide (BUMEX) 0.5 MG tablet Take 1 tablet (0.5 mg total) by mouth daily. 02/17/13  Yes Michele McalpineNadel, Scott M, MD  Calcium 600-200 MG-UNIT tablet Take 1 tablet by mouth 2 (two) times daily.   Yes [provider]  chlorhexidine (PERIDEX) 0.12 % solution Use as directed 15 mLs in the mouth or throat daily.    Yes [provider]  clopidogrel (PLAVIX) 75 MG tablet TAKE 1 TABLET BY MOUTH EVERY DAY 10/16/11  Yes Luis AbedKatz, Jeffrey D, MD  famotidine (PEPCID) 20 MG tablet Take 20 mg by mouth daily.   Yes [provider]  glimepiride (AMARYL) 1 MG tablet Take 1/2 tablet by mouth once daily Patient taking differently: Take 1 mg by mouth daily  with breakfast.  06/18/11  Yes Michele McalpineNadel, Scott M, MD  ibandronate (BONIVA) 150 MG tablet Take 1 tablet (150 mg total) by mouth every 30 (thirty) days. Take in the morning with a full glass of water, on an empty stomach, and do not take anything else by mouth or lie down for the next 30 min. 06/18/11  Yes Michele McalpineNadel, Scott M, MD  losartan (COZAAR) 50 MG tablet Take 50 mg by mouth every evening.    Yes [provider]  metFORMIN (GLUCOPHAGE) 1000 MG tablet Take 1,000 mg by mouth 2 (two) times daily with a meal.   Yes [provider]  metoprolol succinate (TOPROL-XL) 25 MG 24 hr tablet Take 25 mg by mouth daily.   Yes [provider]  QUEtiapine (SEROQUEL) 25 MG tablet Take 0.5 tablets (12.5 mg total) by mouth at bedtime. 12/25/12  Yes Michele McalpineNadel, Scott M, MD  vitamin B-12 (CYANOCOBALAMIN) 1000 MCG tablet Take 1,000 mcg by mouth daily.   Yes [provider]  acetaminophen (TYLENOL) 325 MG tablet Take 650 mg by mouth every 6 (six) hours as needed for mild pain.    [provider]    Family History Family History  Problem Relation Age of Onset  . Stroke Father   . Cancer Mother        bladder cancer  . Hypertension Mother   . Stroke Mother   . Leukemia Sister   . Colon cancer Neg Hx     Social History Social History  Substance Use Topics  . Smoking status: Never Smoker  . Smokeless tobacco: Never Used  . Alcohol use No     Allergies   Aspirin; Iodine; Irbesartan-hydrochlorothiazide; and Penicillins   Review of Systems Review of Systems  Unable to perform ROS: Dementia     Physical Exam Updated Vital Signs BP 139/64 (BP Location: Right Arm)   Pulse 67   Temp 98 F (36.7 C) (Oral)   Resp 19   Wt 74.8 kg (165 lb)   SpO2 95%   BMI 30.18 kg/m   Physical Exam  Constitutional: She is oriented to person, place, and time. She appears well-developed and well-nourished. No distress.  HENT:  Head: Normocephalic.  Small parietal scalp hematoma    Eyes: Pupils are equal, round, and reactive to light. Conjunctivae and EOM are normal. No scleral icterus.  Neck: Normal range of motion.  Cardiovascular: Normal rate, regular rhythm and normal heart sounds.  Exam reveals no gallop and no friction rub.   No murmur heard. Pulmonary/Chest: Effort normal and breath sounds normal. No respiratory distress.  Abdominal: Soft. Bowel sounds are normal. She exhibits no distension and no mass. There is no  tenderness. There is no guarding.  Neurological: She is alert and oriented to person, place, and time.  Skin: Skin is warm and dry. She is not diaphoretic.  Psychiatric: Her behavior is normal.  Nursing note and vitals reviewed.    ED Treatments / Results  Labs (all labs ordered are listed, but only abnormal results are displayed) Labs Reviewed - No data to display  EKG  EKG Interpretation  Date/Time:  Thursday September 05 2017 19:42:41 EDT Ventricular Rate:  66 PR Interval:    QRS Duration: 91 QT Interval:  417 QTC Calculation: 437 R Axis:   121 Text Interpretation:  Sinus rhythm Borderline prolonged PR interval Inferior infarct, old Probable anteroseptal infarct, old No significant change since last tracing Confirmed by Linwood Dibbles [phone]) on 09/05/2017 8:26:31 PM       Radiology No results found.  Procedures Procedures (including critical care time)  Medications Ordered in ED Medications - No data to display   Initial Impression / Assessment and Plan / ED Course  I have reviewed the triage vital signs and the nursing notes.  Pertinent labs & imaging results that were available during my care of the patient were reviewed by me and considered in my medical decision making (see chart for details).    Elderly patient with dementia. Fall at nursing home. At baseline mental status. No signs of trauma outside of those mentioned in the PE. Negative labs and imaging. Appears safe to return to SNF   Final Clinical Impressions(s)  / ED Diagnoses   Final diagnoses:  Fall, initial encounter    New Prescriptions New Prescriptions   No medications on file     Arthor Captain, PA-C 09/09/17 1308    Linwood Dibbles, MD 09/10/17 [phone]

## 2017-09-05 NOTE — ED Notes (Signed)
Patient is A&O x4. Is able to express that she is at the hospital because she fell. Denies any pain. Able to move extremities without pain or discomfort. She able to verify name and dob and that it is 8pm at night. Patient states she was walking and began stumbling and fell. States she tried to get back up and staff helped. She denies LOC.

## 2017-09-06 NOTE — ED Notes (Signed)
Patient picked up by [facility]. 

## 2018-03-12 ENCOUNTER — Emergency Department (HOSPITAL_COMMUNITY): Payer: Medicare Other

## 2018-03-12 ENCOUNTER — Emergency Department (HOSPITAL_COMMUNITY)
Admission: EM | Admit: 2018-03-12 | Discharge: 2018-03-12 | Disposition: A | Discharge status: Home / Self Care | Payer: Medicare Other | Attending: Emergency Medicine | Admitting: Emergency Medicine

## 2018-03-12 ENCOUNTER — Encounter (HOSPITAL_COMMUNITY): Payer: Self-pay

## 2018-03-12 DIAGNOSIS — Z7902 Long term (current) use of antithrombotics/antiplatelets: Secondary | ICD-10-CM | POA: Insufficient documentation

## 2018-03-12 DIAGNOSIS — R0602 Shortness of breath: Secondary | ICD-10-CM

## 2018-03-12 DIAGNOSIS — I10 Essential (primary) hypertension: Secondary | ICD-10-CM | POA: Diagnosis not present

## 2018-03-12 DIAGNOSIS — I2510 Atherosclerotic heart disease of native coronary artery without angina pectoris: Secondary | ICD-10-CM | POA: Diagnosis not present

## 2018-03-12 DIAGNOSIS — Z79899 Other long term (current) drug therapy: Secondary | ICD-10-CM | POA: Diagnosis not present

## 2018-03-12 DIAGNOSIS — Z7984 Long term (current) use of oral hypoglycemic drugs: Secondary | ICD-10-CM | POA: Diagnosis not present

## 2018-03-12 DIAGNOSIS — E119 Type 2 diabetes mellitus without complications: Secondary | ICD-10-CM | POA: Insufficient documentation

## 2018-03-12 DIAGNOSIS — Z951 Presence of aortocoronary bypass graft: Secondary | ICD-10-CM | POA: Insufficient documentation

## 2018-03-12 DIAGNOSIS — F0390 Unspecified dementia without behavioral disturbance: Secondary | ICD-10-CM | POA: Diagnosis not present

## 2018-03-12 LAB — CBC WITH DIFFERENTIAL/PLATELET
BASOS ABS: 0 10*3/uL (ref 0.0–0.1)
BASOS PCT: 0 %
EOS ABS: 0.2 10*3/uL (ref 0.0–0.7)
Eosinophils Relative: 3 %
HCT: 36.7 % (ref 36.0–46.0)
HEMOGLOBIN: 11.8 g/dL — AB (ref 12.0–15.0)
Lymphocytes Relative: 28 %
Lymphs Abs: 1.9 10*3/uL (ref 0.7–4.0)
MCH: 31.1 pg (ref 26.0–34.0)
MCHC: 32.2 g/dL (ref 30.0–36.0)
MCV: 96.8 fL (ref 78.0–100.0)
Monocytes Absolute: 0.5 10*3/uL (ref 0.1–1.0)
Monocytes Relative: 7 %
NEUTROS PCT: 62 %
Neutro Abs: 4.2 10*3/uL (ref 1.7–7.7)
Platelets: 156 10*3/uL (ref 150–400)
RBC: 3.79 MIL/uL — AB (ref 3.87–5.11)
RDW: 13.4 % (ref 11.5–15.5)
WBC: 6.9 10*3/uL (ref 4.0–10.5)

## 2018-03-12 LAB — PROTIME-INR
INR: 0.98
PROTHROMBIN TIME: 12.9 s (ref 11.4–15.2)

## 2018-03-12 LAB — COMPREHENSIVE METABOLIC PANEL
ALK PHOS: 35 U/L — AB (ref 38–126)
ALT: 7 U/L — AB (ref 14–54)
AST: 25 U/L (ref 15–41)
Albumin: 3.1 g/dL — ABNORMAL LOW (ref 3.5–5.0)
Anion gap: 6 (ref 5–15)
BUN: 9 mg/dL (ref 6–20)
CALCIUM: 8.9 mg/dL (ref 8.9–10.3)
CHLORIDE: 106 mmol/L (ref 101–111)
CO2: 29 mmol/L (ref 22–32)
CREATININE: 0.86 mg/dL (ref 0.44–1.00)
GFR calc Af Amer: >60 mL/min (ref >60)
GFR, EST NON AFRICAN AMERICAN: 59 mL/min — AB (ref >60)
Glucose, Bld: 99 mg/dL (ref 65–99)
Potassium: 4.7 mmol/L (ref 3.5–5.1)
Sodium: 141 mmol/L (ref 135–145)
Total Bilirubin: 0.8 mg/dL (ref 0.3–1.2)
Total Protein: 5.5 g/dL — ABNORMAL LOW (ref 6.5–8.1)

## 2018-03-12 LAB — BRAIN NATRIURETIC PEPTIDE: B NATRIURETIC PEPTIDE 5: 90.2 pg/mL (ref 0.0–100.0)

## 2018-03-12 LAB — MAGNESIUM: Magnesium: 1.9 mg/dL (ref 1.7–2.4)

## 2018-03-12 LAB — TROPONIN I

## 2018-03-12 NOTE — ED Notes (Signed)
Attempted report x 3 and they hung up and would not answer on the third try.

## 2018-03-12 NOTE — ED Notes (Signed)
WAITING FOR [facility] 

## 2018-03-12 NOTE — ED Provider Notes (Signed)
[facility] EMERGENCY DEPARTMENT Provider Note   CSN: [id number] Arrival date & time: 03/12/18  1313     History   Chief Complaint Chief Complaint  Patient presents with  . Chest Pain    HPI [name] is a 84 y.o. female.  HPI Elderly female with multiple medical issues including dementia, vasculopathy presents with dyspnea. Patient is a very poor historian, likely due to her dementia, level 5 caveat. She notes that she has had difficulty breathing for little while, but when asked if little while his minutes, days, weeks, she specifies for a little while. She denies any ongoing pain, lightheadedness, nausea, fever. No nursing report of substantial changes from baseline condition including behavior, or fever. No report of recent medication.  Past Medical History:  Diagnosis Date  . Anxiety   . Aspirin intolerance   . Bronchitis, mucopurulent recurrent (HCC)   . CAD (coronary artery disease)    2008, question slight anteroapical ischemia  . Carotid artery disease (HCC)    Doppler, December, 2007, bilateral 0-39%  . Dementia    Senile dementia with delusional features  . Diabetes mellitus   . DJD (degenerative joint disease)   . Ejection fraction    EF 70%, nuclear, 2007  . Esophageal stricture   . GERD (gastroesophageal reflux disease)   . Hematoma    Large hematoma from fall, November, 2009, Coumadin stopped  . Hemorrhoids   . Hiatal hernia   . Hx of CABG    2003, ... LIMA not used... small caliber  . Hyperlipidemia   . Hypertension   . IBS (irritable bowel syndrome)   . Nephrolithiasis   . Obesity   . Osteoporosis   . Palpitations   . Pulmonary embolism (HCC)   . Right knee injury   . Senile dementia with delusional features (HCC)   . SOB (shortness of breath)   . Subclavian arterial stenosis (HCC)    PCI  . Warfarin anticoagulation    stopped 09/2008 with large hematoma from fall    Patient Active Problem List   Diagnosis  Date Noted  . Unspecified vitamin D deficiency 02/18/2013  . Hand fracture 08/21/2012  . Hand pain 08/20/2012  . UTI (lower urinary tract infection) 10/27/2011  . Fall 10/27/2011  . Hyperlipidemia 07/30/2011  . Hypertension   . Subclavian arterial stenosis (HCC)   . Diabetes mellitus (HCC)   . GERD (gastroesophageal reflux disease)   . Hx of CABG   . Carotid artery disease (HCC)   . Aspirin intolerance   . CAD (coronary artery disease)   . ESOPHAGEAL STRICTURE 03/01/2010  . DYSPHAGIA UNSPECIFIED 03/01/2010  . SENILE DEMENTIA WITH DELUSIONAL FEATURES 09/16/2008  . PERIPHERAL VASCULAR DISEASE 12/18/2007  . BRONCHITIS, RECURRENT 12/18/2007  . NEPHROLITHIASIS 12/18/2007  . OSTEOPOROSIS 12/18/2007  . Nonspecific (abnormal) findings on radiological and other examination of body structure 12/18/2007  . ABNORMAL CHEST XRAY 12/18/2007  . ANXIETY 11/10/2007  . IBS 11/10/2007  . DEGENERATIVE JOINT DISEASE 11/10/2007    Past Surgical History:  Procedure Laterality Date  . ABDOMINAL HYSTERECTOMY    . APPENDECTOMY    . cabg  1 vessel w/SVG to LAD  2003  . CATARACT EXTRACTION    . FRACTURE SURGERY    . HEMORRHOID SURGERY    . mult orthopedic procedures     Dr. Arnette Norris arm, CTS,cyst removal  . open heart surgery       OB History   None  Home Medications    Prior to Admission medications   Medication Sig Start Date End Date Taking? Authorizing Provider  acetaminophen (TYLENOL) 325 MG tablet Take 650 mg by mouth every 6 (six) hours as needed for mild pain.    [provider]  atorvastatin (LIPITOR) 20 MG tablet Take 20 mg by mouth daily.    [provider]  bumetanide (BUMEX) 0.5 MG tablet Take 1 tablet (0.5 mg total) by mouth daily. 02/17/13   Michele Mcalpine, MD  Calcium 600-200 MG-UNIT tablet Take 1 tablet by mouth 2 (two) times daily.    [provider]  chlorhexidine (PERIDEX) 0.12 % solution Use as directed 15 mLs in the mouth or throat  daily.     [provider]  clopidogrel (PLAVIX) 75 MG tablet TAKE 1 TABLET BY MOUTH EVERY DAY 10/16/11   Luis Abed, MD  famotidine (PEPCID) 20 MG tablet Take 20 mg by mouth daily.    [provider]  glimepiride (AMARYL) 1 MG tablet Take 1/2 tablet by mouth once daily Patient taking differently: Take 1 mg by mouth daily with breakfast.  06/18/11   Michele Mcalpine, MD  ibandronate (BONIVA) 150 MG tablet Take 1 tablet (150 mg total) by mouth every 30 (thirty) days. Take in the morning with a full glass of water, on an empty stomach, and do not take anything else by mouth or lie down for the next 30 min. 06/18/11   Michele Mcalpine, MD  losartan (COZAAR) 50 MG tablet Take 50 mg by mouth every evening.     [provider]  metFORMIN (GLUCOPHAGE) 1000 MG tablet Take 1,000 mg by mouth 2 (two) times daily with a meal.    [provider]  metoprolol succinate (TOPROL-XL) 25 MG 24 hr tablet Take 25 mg by mouth daily.    [provider]  QUEtiapine (SEROQUEL) 25 MG tablet Take 0.5 tablets (12.5 mg total) by mouth at bedtime. 12/25/12   Michele Mcalpine, MD  vitamin B-12 (CYANOCOBALAMIN) 1000 MCG tablet Take 1,000 mcg by mouth daily.    [provider]    Family History Family History  Problem Relation Age of Onset  . Stroke Father   . Cancer Mother        bladder cancer  . Hypertension Mother   . Stroke Mother   . Leukemia Sister   . Colon cancer Neg Hx     Social History Social History   Tobacco Use  . Smoking status: Never Smoker  . Smokeless tobacco: Never Used  Substance Use Topics  . Alcohol use: No  . Drug use: No     Allergies   Aspirin; Iodine; Irbesartan-hydrochlorothiazide; and Penicillins   Review of Systems Review of Systems  Unable to perform ROS: Dementia     Physical Exam Updated Vital Signs BP (!) 160/57 (BP Location: Right Arm)   Pulse 71   Temp 97.7 F (36.5 C) (Oral)   Resp 20   Ht  (1.727 m)    Wt 68 kg (150 lb)   SpO2 100%   BMI 22.81 kg/m   Physical Exam  Constitutional: She has a sickly appearance. No distress.  HENT:  Head: Normocephalic and atraumatic.  Eyes: Conjunctivae and EOM are normal.  Cardiovascular: Normal rate and regular rhythm.  Pulmonary/Chest: Effort normal and breath sounds normal. No stridor. No respiratory distress.  Abdominal: She exhibits no distension.  Musculoskeletal: She exhibits no edema.  Neurological: She is alert. She  displays atrophy. She displays no tremor. No cranial nerve deficit. She exhibits normal muscle tone. She displays no seizure activity.  Skin: Skin is warm and dry.  Psychiatric: Cognition and memory are impaired.  Nursing note and vitals reviewed.    ED Treatments / Results  Labs (all labs ordered are listed, but only abnormal results are displayed) Labs Reviewed  COMPREHENSIVE METABOLIC PANEL  MAGNESIUM  TROPONIN I  BRAIN NATRIURETIC PEPTIDE  CBC WITH DIFFERENTIAL/PLATELET  PROTIME-INR  CBG MONITORING, ED    EMS rhythm strip with rate 69, sinus rhythm, low voltage, anterior injury pattern, abnormal   EKG EKG Interpretation  Date/Time:  Wednesday Mar 12 2018 13:35:52 EDT Ventricular Rate:  63 PR Interval:    QRS Duration: 92 QT Interval:  416 QTC Calculation: 426 R Axis:   139 Text Interpretation:  Sinus or ectopic atrial rhythm Prolonged PR interval Inferior infarct, old Anterior infarct, old No significant change since last tracing Abnormal ekg Confirmed by Gerhard Munch [phone]) on 03/12/2018 1:52:02 PM   Radiology No results found.  Procedures Procedures (including critical care time)  Medications Ordered in ED Medications - No data to display   Initial Impression / Assessment and Plan / ED Course  I have reviewed the triage vital signs and the nursing notes.  Pertinent labs & imaging results that were available during my care of the patient were reviewed by me and considered in my medical  decision making (see chart for details).  On repeat exam the patient is in no distress.  This elderly female with dementia presents from nursing facility with concern of dyspnea. Patient is awake, alert, no hypoxia, no increased respiratory effort, no abnormal breath sounds. Patient's evaluation here reassuring, no evidence for pneumonia, ACS, or other acute new pathology. Given the reassuring findings, absence of distress, stable vital signs, patient discharged in stable condition.  Patient's niece present on repeat exam, we discussed all findings, reassuring results, patient discharged in stable condition.  Final Clinical Impressions(s) / ED Diagnoses  Dyspnea   Gerhard Munch, MD 03/12/18 [phone]

## 2018-03-12 NOTE — ED Notes (Signed)
[facility] called for transport.  

## 2018-03-12 NOTE — ED Triage Notes (Signed)
Pt arrived from [facility] c/o central non-radiating chest pain starting 12:10 during lunch 6/10.  EMS gave 324 ASA, no nitro was given.  Pt arrives without any current c/o chest pain.

## 2018-03-12 NOTE — ED Notes (Signed)
PT ambulated to restroom without distress.  

## 2018-03-12 NOTE — Discharge Instructions (Addendum)
As discussed, your evaluation today has been largely reassuring.  But, it is important that you monitor your condition carefully, and do not hesitate to return to the ED if you develop new, or concerning changes in your condition. ? ?Otherwise, please follow-up with your physician for appropriate ongoing care. ? ?

## 2018-08-25 ENCOUNTER — Emergency Department (HOSPITAL_COMMUNITY)
Admission: EM | Admit: 2018-08-25 | Discharge: 2018-08-25 | Disposition: A | Discharge status: Home / Self Care | Payer: Medicare Other | Attending: Emergency Medicine | Admitting: Emergency Medicine

## 2018-08-25 ENCOUNTER — Emergency Department (HOSPITAL_COMMUNITY): Payer: Medicare Other

## 2018-08-25 DIAGNOSIS — F0390 Unspecified dementia without behavioral disturbance: Secondary | ICD-10-CM | POA: Diagnosis not present

## 2018-08-25 DIAGNOSIS — M542 Cervicalgia: Secondary | ICD-10-CM | POA: Diagnosis present

## 2018-08-25 DIAGNOSIS — I2510 Atherosclerotic heart disease of native coronary artery without angina pectoris: Secondary | ICD-10-CM | POA: Insufficient documentation

## 2018-08-25 DIAGNOSIS — I10 Essential (primary) hypertension: Secondary | ICD-10-CM | POA: Insufficient documentation

## 2018-08-25 DIAGNOSIS — Z951 Presence of aortocoronary bypass graft: Secondary | ICD-10-CM | POA: Diagnosis not present

## 2018-08-25 DIAGNOSIS — Z79899 Other long term (current) drug therapy: Secondary | ICD-10-CM | POA: Insufficient documentation

## 2018-08-25 DIAGNOSIS — R51 Headache: Secondary | ICD-10-CM | POA: Insufficient documentation

## 2018-08-25 DIAGNOSIS — Z7984 Long term (current) use of oral hypoglycemic drugs: Secondary | ICD-10-CM | POA: Insufficient documentation

## 2018-08-25 DIAGNOSIS — W19XXXA Unspecified fall, initial encounter: Secondary | ICD-10-CM

## 2018-08-25 DIAGNOSIS — W0110XA Fall on same level from slipping, tripping and stumbling with subsequent striking against unspecified object, initial encounter: Secondary | ICD-10-CM | POA: Diagnosis not present

## 2018-08-25 DIAGNOSIS — E119 Type 2 diabetes mellitus without complications: Secondary | ICD-10-CM | POA: Insufficient documentation

## 2018-08-25 DIAGNOSIS — Z7902 Long term (current) use of antithrombotics/antiplatelets: Secondary | ICD-10-CM | POA: Diagnosis not present

## 2018-08-25 NOTE — ED Notes (Signed)
[facility]  Called at this time 

## 2018-08-25 NOTE — Discharge Instructions (Addendum)
You were evaluated today after a fall.  Your CT of the head and neck are negative.  Please follow- up with your primary care provider in 2-3 days for reevaluation.  Return to the ED with any new or worsening symptoms.

## 2018-08-25 NOTE — ED Triage Notes (Signed)
Transported by [facility] from [place]--witnessed fall, injuring left temple, neck and lower back. Patient placed in soft collar. Takes Plavix. AAO x 3, soft collar intact.

## 2018-08-25 NOTE — ED Notes (Signed)
Bed: [facility] Expected date:  Expected time:  Means of arrival:  Comments: EMS 84yo f fall neck pain

## 2018-08-25 NOTE — ED Provider Notes (Signed)
Medical screening examination/treatment/procedure(s) were conducted as a shared visit with non-physician practitioner(s) and myself.  I personally evaluated the patient during the encounter.  None Had a witnessed fall.  She hit the front of her head.  She is on blood thinners.  No headache.  She denies any shoulder, hip or extremity pain.  Patient is clinically well in appearance.  She is alert and nontoxic.  No respiratory distress.  Normal range of motion of extremities.  I agree with plan of management.   Arby Barrette, MD 08/25/18 2059

## 2018-08-25 NOTE — ED Notes (Signed)
[facility] at this time.

## 2018-08-25 NOTE — ED Provider Notes (Signed)
[facility]-EMERGENCY DEPT Provider Note   CSN: [id number] Arrival date & time: 08/25/18  1859   History   Chief Complaint Chief Complaint  Patient presents with  . Fall    HPI [name] is a 84 y.o. female with a past medical history significant for dementia presents via EMS for fall.  Fall was witnessed at her living facility.  Facility states it was a mechanical fall after she tripped and hit the frontal region of her head.  Patient is on blood thinners.  Patient denies headache, vision changes, chest pain, shortness of breath, abdominal pain, midline back pain, hip or lower extremity pain.  Patient states she does have mild tenderness rated a 2/10 to her forehead.  Denies numbness or tingling in extremities.  Denies aggravating or alleviating factors.  HPI  Past Medical History:  Diagnosis Date  . Anxiety   . Aspirin intolerance   . Bronchitis, mucopurulent recurrent (HCC)   . CAD (coronary artery disease)    2008, question slight anteroapical ischemia  . Carotid artery disease (HCC)    Doppler, December, 2007, bilateral 0-39%  . Dementia    Senile dementia with delusional features  . Diabetes mellitus   . DJD (degenerative joint disease)   . Ejection fraction    EF 70%, nuclear, 2007  . Esophageal stricture   . GERD (gastroesophageal reflux disease)   . Hematoma    Large hematoma from fall, November, 2009, Coumadin stopped  . Hemorrhoids   . Hiatal hernia   . Hx of CABG    2003, ... LIMA not used... small caliber  . Hyperlipidemia   . Hypertension   . IBS (irritable bowel syndrome)   . Nephrolithiasis   . Obesity   . Osteoporosis   . Palpitations   . Pulmonary embolism (HCC)   . Right knee injury   . Senile dementia with delusional features (HCC)   . SOB (shortness of breath)   . Subclavian arterial stenosis (HCC)    PCI  . Warfarin anticoagulation    stopped 09/2008 with large hematoma from fall    Patient Active Problem List    Diagnosis Date Noted  . Unspecified vitamin D deficiency 02/18/2013  . Hand fracture 08/21/2012  . Hand pain 08/20/2012  . UTI (lower urinary tract infection) 10/27/2011  . Fall 10/27/2011  . Hyperlipidemia 07/30/2011  . Hypertension   . Subclavian arterial stenosis (HCC)   . Diabetes mellitus (HCC)   . GERD (gastroesophageal reflux disease)   . Hx of CABG   . Carotid artery disease (HCC)   . Aspirin intolerance   . CAD (coronary artery disease)   . ESOPHAGEAL STRICTURE 03/01/2010  . DYSPHAGIA UNSPECIFIED 03/01/2010  . SENILE DEMENTIA WITH DELUSIONAL FEATURES 09/16/2008  . PERIPHERAL VASCULAR DISEASE 12/18/2007  . BRONCHITIS, RECURRENT 12/18/2007  . NEPHROLITHIASIS 12/18/2007  . OSTEOPOROSIS 12/18/2007  . Nonspecific (abnormal) findings on radiological and other examination of body structure 12/18/2007  . ABNORMAL CHEST XRAY 12/18/2007  . ANXIETY 11/10/2007  . IBS 11/10/2007  . DEGENERATIVE JOINT DISEASE 11/10/2007    Past Surgical History:  Procedure Laterality Date  . ABDOMINAL HYSTERECTOMY    . APPENDECTOMY    . cabg  1 vessel w/SVG to LAD  2003  . CATARACT EXTRACTION    . FRACTURE SURGERY    . HEMORRHOID SURGERY    . mult orthopedic procedures     Dr. Arnette Norris arm, CTS,cyst removal  . open heart surgery  OB History   None      Home Medications    Prior to Admission medications   Medication Sig Start Date End Date Taking? Authorizing Provider  acetaminophen (TYLENOL) 325 MG tablet Take 650 mg by mouth every 6 (six) hours as needed for mild pain.   Yes [provider]  atorvastatin (LIPITOR) 20 MG tablet Take 20 mg by mouth daily.   Yes [provider]  bumetanide (BUMEX) 0.5 MG tablet Take 1 tablet (0.5 mg total) by mouth daily. 02/17/13  Yes Michele Mcalpine, MD  Calcium 600-200 MG-UNIT tablet Take 1 tablet by mouth 2 (two) times daily.   Yes [provider]  chlorhexidine (PERIDEX) 0.12 % solution Use as directed 15 mLs  in the mouth or throat daily.    Yes [provider]  clopidogrel (PLAVIX) 75 MG tablet TAKE 1 TABLET BY MOUTH EVERY DAY 10/16/11  Yes Luis Abed, MD  famotidine (PEPCID) 20 MG tablet Take 20 mg by mouth daily.   Yes [provider]  glimepiride (AMARYL) 1 MG tablet Take 1/2 tablet by mouth once daily Patient taking differently: Take 1 mg by mouth daily with breakfast.  06/18/11  Yes Michele Mcalpine, MD  ibandronate (BONIVA) 150 MG tablet Take 1 tablet (150 mg total) by mouth every 30 (thirty) days. Take in the morning with a full glass of water, on an empty stomach, and do not take anything else by mouth or lie down for the next 30 min. 06/18/11  Yes Michele Mcalpine, MD  losartan (COZAAR) 50 MG tablet Take 50 mg by mouth every evening.    Yes [provider]  metFORMIN (GLUCOPHAGE) 1000 MG tablet Take 1,000 mg by mouth 2 (two) times daily with a meal.   Yes [provider]  metoprolol succinate (TOPROL-XL) 25 MG 24 hr tablet Take 25 mg by mouth daily.   Yes [provider]  QUEtiapine (SEROQUEL) 25 MG tablet Take 0.5 tablets (12.5 mg total) by mouth at bedtime. Patient taking differently: Take 25 mg by mouth at bedtime.  12/25/12  Yes Michele Mcalpine, MD  vitamin B-12 (CYANOCOBALAMIN) 1000 MCG tablet Take 1,000 mcg by mouth daily.   Yes [provider]    Family History Family History  Problem Relation Age of Onset  . Stroke Father   . Cancer Mother        bladder cancer  . Hypertension Mother   . Stroke Mother   . Leukemia Sister   . Colon cancer Neg Hx     Social History Social History   Tobacco Use  . Smoking status: Never Smoker  . Smokeless tobacco: Never Used  Substance Use Topics  . Alcohol use: No  . Drug use: No     Allergies   Aspirin; Iodine; Irbesartan-hydrochlorothiazide; and Penicillins   Review of Systems Review of Systems  Constitutional: Negative.   HENT: Negative.   Respiratory: Negative.     Cardiovascular: Negative.   Gastrointestinal: Negative.   Genitourinary: Negative.   Musculoskeletal: Negative.   Skin: Negative.   Neurological: Negative.        Mild frontal scalp tenderness.  All other systems reviewed and are negative.    Physical Exam Updated Vital Signs BP (!) 110/56 (BP Location: Left Arm)   Pulse 63   Temp 98.1 F (36.7 C) (Oral)   Resp 12   SpO2 96%   Physical Exam   Physical Exam  Constitutional: Pt appears well-developed and well-nourished.  No distress.  HENT:  Head: Normocephalic. Mild tenderness to palpation over forehead.  There is no obvious hematoma, laceration, erythema or ecchymosis. Mouth/Throat: Oropharynx is clear and moist. No oropharyngeal exudate.  Eyes: Conjunctivae are normal.  Neck: Normal range of motion. Neck supple.  Mild tenderness palpation midline neck. Full ROM without pain  Cardiovascular: Normal rate, regular rhythm and intact distal pulses.   Pulmonary/Chest: Effort normal and breath sounds normal. No respiratory distress. Pt has no wheezes.  Abdominal: Soft. Pt exhibits no distension. There is no tenderness, rebound or guarding. No abd bruit or pulsatile mass Musculoskeletal:  Full range of motion of the T-spine and L-spine with flexion, hyperextension, and lateral flexion. No midline tenderness or stepoffs. No tenderness to palpation of the spinous processes of the T-spine or L-spine. No tenderness to palpation of the paraspinous muscles of the L-spine. Negative straight leg raise. Lymphadenopathy:    Pt has no cervical adenopathy.  Neurological: Pt is alert. Pt has normal reflexes.  Reflex Scores:      Bicep reflexes are 2+ on the right side and 2+ on the left side.      Brachioradialis reflexes are 2+ on the right side and 2+ on the left side.      Patellar reflexes are 2+ on the right side and 2+ on the left side.      Achilles reflexes are 2+ on the right side and 2+ on the left side. Speech is clear and goal  oriented, follows commands Normal 5/5 strength in upper and lower extremities bilaterally including dorsiflexion and plantar flexion, strong and equal grip strength Sensation normal to light and sharp touch Moves extremities without ataxia, coordination intact Normal balance Skin: Skin is warm and dry. No rash noted or lesions noted. Pt is not diaphoretic. No erythema, ecchymosis,edema or warmth.  Psychiatric: Pt has a normal mood and affect. Behavior is normal.  Nursing note and vitals reviewed.  ED Treatments / Results  Labs (all labs ordered are listed, but only abnormal results are displayed) Labs Reviewed - No data to display  EKG None  Radiology Ct Head Wo Contrast  Result Date: 08/25/2018 CLINICAL DATA:  Fall, neck pain EXAM: CT HEAD WITHOUT CONTRAST CT CERVICAL SPINE WITHOUT CONTRAST TECHNIQUE: Multidetector CT imaging of the head and cervical spine was performed following the standard protocol without intravenous contrast. Multiplanar CT image reconstructions of the cervical spine were also generated. COMPARISON:  09/05/2017 FINDINGS: CT HEAD FINDINGS Brain: There is atrophy and chronic small vessel disease changes. No acute intracranial abnormality. Specifically, no hemorrhage, hydrocephalus, mass lesion, acute infarction, or significant intracranial injury. Vascular: No hyperdense vessel or unexpected calcification. Skull: No acute calvarial abnormality. Sinuses/Orbits: Visualized paranasal sinuses and mastoids clear. Orbital soft tissues unremarkable. Other: None CT CERVICAL SPINE FINDINGS Alignment: Slight anterolisthesis of C4 on C5 and C5 on C6 related to facet disease. Skull base and vertebrae: No acute fracture. No primary bone lesion or focal pathologic process. Soft tissues and spinal canal: No prevertebral fluid or swelling. No visible canal hematoma. Disc levels: Disc space narrowing at C5-6 and C6-7. Diffuse degenerative facet disease bilaterally. Upper chest: Scarring in  the left apex. Other: Carotid artery calcifications. IMPRESSION: No acute intracranial abnormality. Atrophy, chronic microvascular disease. Degenerative changes in the cervical spine. No acute bony abnormality. Electronically Signed   By: Charlett Nose M.D.   On: 08/25/2018 22:28   Ct Cervical Spine Wo Contrast  Result Date: 08/25/2018 CLINICAL DATA:  Fall, neck pain EXAM: CT HEAD WITHOUT  CONTRAST CT CERVICAL SPINE WITHOUT CONTRAST TECHNIQUE: Multidetector CT imaging of the head and cervical spine was performed following the standard protocol without intravenous contrast. Multiplanar CT image reconstructions of the cervical spine were also generated. COMPARISON:  09/05/2017 FINDINGS: CT HEAD FINDINGS Brain: There is atrophy and chronic small vessel disease changes. No acute intracranial abnormality. Specifically, no hemorrhage, hydrocephalus, mass lesion, acute infarction, or significant intracranial injury. Vascular: No hyperdense vessel or unexpected calcification. Skull: No acute calvarial abnormality. Sinuses/Orbits: Visualized paranasal sinuses and mastoids clear. Orbital soft tissues unremarkable. Other: None CT CERVICAL SPINE FINDINGS Alignment: Slight anterolisthesis of C4 on C5 and C5 on C6 related to facet disease. Skull base and vertebrae: No acute fracture. No primary bone lesion or focal pathologic process. Soft tissues and spinal canal: No prevertebral fluid or swelling. No visible canal hematoma. Disc levels: Disc space narrowing at C5-6 and C6-7. Diffuse degenerative facet disease bilaterally. Upper chest: Scarring in the left apex. Other: Carotid artery calcifications. IMPRESSION: No acute intracranial abnormality. Atrophy, chronic microvascular disease. Degenerative changes in the cervical spine. No acute bony abnormality. Electronically Signed   By: Charlett Nose M.D.   On: 08/25/2018 22:28    Procedures Procedures (including critical care time)  Medications Ordered in ED Medications -  No data to display   Initial Impression / Assessment and Plan / ED Course  I have reviewed the triage vital signs and the nursing notes.  Pertinent labs & imaging results that were available during my care of the patient were reviewed by me and considered in my medical decision making (see chart for details).  84 year old female with past medical history significant for dementia and who appears otherwise well presents for evaluation after witnessed mechanical fall at living facility.  Mild tenderness palpation to forehead. No hematoma. She is on anticoagulation.  Will obtain CT head and neck to rule out bleed and fracture.  Her exam is without deficits.  Able to move all her extremities spontaneously and without pain. At baseline mental status. No signs of obvious trauma. CT Head and neck negative. Patient stable for dc back to SNF.   Final Clinical Impressions(s) / ED Diagnoses   Final diagnoses:  Fall, initial encounter    ED Discharge Orders    None       Henderly, Britni A, PA-C 08/25/18 2244    Arby Barrette, MD 08/26/18 1722

## 2018-10-11 ENCOUNTER — Encounter (HOSPITAL_COMMUNITY): Payer: Self-pay | Admitting: Emergency Medicine

## 2018-10-11 ENCOUNTER — Other Ambulatory Visit: Payer: Self-pay

## 2018-10-11 ENCOUNTER — Inpatient Hospital Stay (HOSPITAL_COMMUNITY)
Admission: EM | Admit: 2018-10-11 | Discharge: 2018-10-17 | DRG: 300 | Disposition: A | Discharge status: Nursing Home (Includes ICF) | Payer: Medicare Other | Attending: Internal Medicine | Admitting: Internal Medicine

## 2018-10-11 ENCOUNTER — Emergency Department (HOSPITAL_COMMUNITY): Payer: Medicare Other

## 2018-10-11 DIAGNOSIS — R0602 Shortness of breath: Secondary | ICD-10-CM

## 2018-10-11 DIAGNOSIS — R6510 Systemic inflammatory response syndrome (SIRS) of non-infectious origin without acute organ dysfunction: Secondary | ICD-10-CM

## 2018-10-11 DIAGNOSIS — E119 Type 2 diabetes mellitus without complications: Secondary | ICD-10-CM | POA: Diagnosis present

## 2018-10-11 DIAGNOSIS — I110 Hypertensive heart disease with heart failure: Secondary | ICD-10-CM | POA: Diagnosis not present

## 2018-10-11 DIAGNOSIS — Z86711 Personal history of pulmonary embolism: Secondary | ICD-10-CM

## 2018-10-11 DIAGNOSIS — F0390 Unspecified dementia without behavioral disturbance: Secondary | ICD-10-CM | POA: Diagnosis present

## 2018-10-11 DIAGNOSIS — Z886 Allergy status to analgesic agent status: Secondary | ICD-10-CM

## 2018-10-11 DIAGNOSIS — J90 Pleural effusion, not elsewhere classified: Secondary | ICD-10-CM

## 2018-10-11 DIAGNOSIS — I708 Atherosclerosis of other arteries: Secondary | ICD-10-CM | POA: Diagnosis present

## 2018-10-11 DIAGNOSIS — K589 Irritable bowel syndrome without diarrhea: Secondary | ICD-10-CM | POA: Diagnosis present

## 2018-10-11 DIAGNOSIS — R21 Rash and other nonspecific skin eruption: Secondary | ICD-10-CM | POA: Diagnosis not present

## 2018-10-11 DIAGNOSIS — Z79899 Other long term (current) drug therapy: Secondary | ICD-10-CM

## 2018-10-11 DIAGNOSIS — Z7902 Long term (current) use of antithrombotics/antiplatelets: Secondary | ICD-10-CM

## 2018-10-11 DIAGNOSIS — I5032 Chronic diastolic (congestive) heart failure: Secondary | ICD-10-CM | POA: Diagnosis present

## 2018-10-11 DIAGNOSIS — I2510 Atherosclerotic heart disease of native coronary artery without angina pectoris: Secondary | ICD-10-CM | POA: Diagnosis present

## 2018-10-11 DIAGNOSIS — Z6822 Body mass index (BMI) 22.0-22.9, adult: Secondary | ICD-10-CM

## 2018-10-11 DIAGNOSIS — M7989 Other specified soft tissue disorders: Secondary | ICD-10-CM | POA: Diagnosis present

## 2018-10-11 DIAGNOSIS — I82401 Acute embolism and thrombosis of unspecified deep veins of right lower extremity: Principal | ICD-10-CM | POA: Diagnosis present

## 2018-10-11 DIAGNOSIS — Z8249 Family history of ischemic heart disease and other diseases of the circulatory system: Secondary | ICD-10-CM

## 2018-10-11 DIAGNOSIS — Z66 Do not resuscitate: Secondary | ICD-10-CM | POA: Diagnosis present

## 2018-10-11 DIAGNOSIS — E785 Hyperlipidemia, unspecified: Secondary | ICD-10-CM | POA: Diagnosis present

## 2018-10-11 DIAGNOSIS — R1310 Dysphagia, unspecified: Secondary | ICD-10-CM | POA: Diagnosis not present

## 2018-10-11 DIAGNOSIS — F0391 Unspecified dementia with behavioral disturbance: Secondary | ICD-10-CM | POA: Diagnosis present

## 2018-10-11 DIAGNOSIS — E876 Hypokalemia: Secondary | ICD-10-CM | POA: Diagnosis present

## 2018-10-11 DIAGNOSIS — Z951 Presence of aortocoronary bypass graft: Secondary | ICD-10-CM

## 2018-10-11 DIAGNOSIS — F419 Anxiety disorder, unspecified: Secondary | ICD-10-CM | POA: Diagnosis present

## 2018-10-11 DIAGNOSIS — K219 Gastro-esophageal reflux disease without esophagitis: Secondary | ICD-10-CM | POA: Diagnosis present

## 2018-10-11 DIAGNOSIS — D696 Thrombocytopenia, unspecified: Secondary | ICD-10-CM | POA: Diagnosis present

## 2018-10-11 DIAGNOSIS — E669 Obesity, unspecified: Secondary | ICD-10-CM | POA: Diagnosis present

## 2018-10-11 DIAGNOSIS — Z515 Encounter for palliative care: Secondary | ICD-10-CM | POA: Diagnosis present

## 2018-10-11 DIAGNOSIS — Z880 Allergy status to penicillin: Secondary | ICD-10-CM

## 2018-10-11 DIAGNOSIS — Z888 Allergy status to other drugs, medicaments and biological substances status: Secondary | ICD-10-CM

## 2018-10-11 DIAGNOSIS — M810 Age-related osteoporosis without current pathological fracture: Secondary | ICD-10-CM | POA: Diagnosis present

## 2018-10-11 LAB — CBC WITH DIFFERENTIAL/PLATELET
ABS IMMATURE GRANULOCYTES: 0.31 10*3/uL — AB (ref 0.00–0.07)
BASOS PCT: 0 %
Basophils Absolute: 0 10*3/uL (ref 0.0–0.1)
Eosinophils Absolute: 0 10*3/uL (ref 0.0–0.5)
Eosinophils Relative: 0 %
HEMATOCRIT: 37.8 % (ref 36.0–46.0)
Hemoglobin: 12.1 g/dL (ref 12.0–15.0)
IMMATURE GRANULOCYTES: 2 %
LYMPHS ABS: 0.6 10*3/uL — AB (ref 0.7–4.0)
Lymphocytes Relative: 3 %
MCH: 30.9 pg (ref 26.0–34.0)
MCHC: 32 g/dL (ref 30.0–36.0)
MCV: 96.4 fL (ref 80.0–100.0)
MONO ABS: 0.6 10*3/uL (ref 0.1–1.0)
MONOS PCT: 4 %
NEUTROS ABS: 14.9 10*3/uL — AB (ref 1.7–7.7)
NEUTROS PCT: 91 %
Platelets: 148 10*3/uL — ABNORMAL LOW (ref 150–400)
RBC: 3.92 MIL/uL (ref 3.87–5.11)
RDW: 13 % (ref 11.5–15.5)
WBC: 16.4 10*3/uL — ABNORMAL HIGH (ref 4.0–10.5)
nRBC: 0 % (ref 0.0–0.2)

## 2018-10-11 LAB — BASIC METABOLIC PANEL
Anion gap: 10 (ref 5–15)
BUN: 23 mg/dL (ref 8–23)
CALCIUM: 8.7 mg/dL — AB (ref 8.9–10.3)
CO2: 24 mmol/L (ref 22–32)
Chloride: 108 mmol/L (ref 98–111)
Creatinine, Ser: 1.03 mg/dL — ABNORMAL HIGH (ref 0.44–1.00)
GFR calc Af Amer: 57 mL/min — ABNORMAL LOW (ref >60)
GFR, EST NON AFRICAN AMERICAN: 49 mL/min — AB (ref >60)
Glucose, Bld: 218 mg/dL — ABNORMAL HIGH (ref 70–99)
Potassium: 3.7 mmol/L (ref 3.5–5.1)
Sodium: 142 mmol/L (ref 135–145)

## 2018-10-11 LAB — URINALYSIS, ROUTINE W REFLEX MICROSCOPIC
Bacteria, UA: NONE SEEN
Bilirubin Urine: NEGATIVE
Glucose, UA: NEGATIVE mg/dL
Hgb urine dipstick: NEGATIVE
Ketones, ur: 20 mg/dL — AB
LEUKOCYTES UA: NEGATIVE
Nitrite: NEGATIVE
Protein, ur: 30 mg/dL — AB
Specific Gravity, Urine: 1.027 (ref 1.005–1.030)
pH: 6 (ref 5.0–8.0)

## 2018-10-11 LAB — I-STAT CG4 LACTIC ACID, ED: Lactic Acid, Venous: 1.37 mmol/L (ref 0.5–1.9)

## 2018-10-11 MED ORDER — SODIUM CHLORIDE 0.9 % IV SOLN
Freq: Once | INTRAVENOUS | Status: AC
  Administered 2018-10-11: via INTRAVENOUS

## 2018-10-11 MED ORDER — ACETAMINOPHEN 325 MG PO TABS
650.0000 mg | ORAL_TABLET | Freq: Once | ORAL | Status: AC
  Administered 2018-10-11: 650 mg via ORAL
  Filled 2018-10-11: qty 2

## 2018-10-11 MED ORDER — VANCOMYCIN HCL IN DEXTROSE 1-5 GM/200ML-% IV SOLN
1000.0000 mg | Freq: Once | INTRAVENOUS | Status: AC
  Administered 2018-10-11: 1000 mg via INTRAVENOUS
  Filled 2018-10-11: qty 200

## 2018-10-11 MED ORDER — CEFTRIAXONE SODIUM 2 G IJ SOLR: 2.0000 g | INTRAMUSCULAR | Status: DC

## 2018-10-11 MED ORDER — METRONIDAZOLE IN NACL 5-0.79 MG/ML-% IV SOLN
500.0000 mg | Freq: Three times a day (TID) | INTRAVENOUS | Status: DC
  Administered 2018-10-11 – 2018-10-14 (×8): 500 mg via INTRAVENOUS
  Filled 2018-10-11 (×8): qty 100

## 2018-10-11 MED ORDER — SODIUM CHLORIDE 0.9 % IV SOLN
2.0000 g | Freq: Once | INTRAVENOUS | Status: AC
  Administered 2018-10-11: 2 g via INTRAVENOUS
  Filled 2018-10-11: qty 2

## 2018-10-11 NOTE — ED Triage Notes (Signed)
Pt BIB EMS from [facility] with complaints of right leg redness unknown when it began. Redness extends from ankle to mid thigh. Hot to the touch.

## 2018-10-11 NOTE — Progress Notes (Signed)
A consult was received from an ED physician for Vancomycin and Cefepime per pharmacy dosing.  The patient's profile has been reviewed for ht/wt/allergies/indication/available labs.   A one time order has been placed for Vancomycin 1gm and Cefepime 2gm IV.  Further antibiotics/pharmacy consults should be ordered by admitting physician if indicated.                       Thank you, Maryellen PilePoindexter, Ivee Poellnitz Trefz, PharmD 10/11/2018  9:58 PM

## 2018-10-11 NOTE — ED Provider Notes (Signed)
[facility]-EMERGENCY DEPT Provider Note   CSN: [id number] Arrival date & time: 10/11/18  1937     History   Chief Complaint Chief Complaint  Patient presents with  . Leg Swelling  . Cellulitis    HPI AYLANI SPURLOCK is a 84 y.o. female with history of advanced dementia, diabetes on oral agents, CAD, hypertension, hyperlipidemia is brought to the ER from [facility] for evaluation of redness to the right lower extremity.  Level 5 caveat applies given history of dementia.  Patient is oriented to self but cannot tell me why she is here.  She denies any pain.  I spoke to staff at [facility] provides further history.  Reports patient had a fall earlier this week.  This evening around 7 PM tech noticed right knee swelling and rash to the right leg.  Facility staff states there have been no medication changes other than possible Tamiflu.  Patient is full code per documentation at [facility].  Only family member documented on patient's chart is her niece, Lewanda Rife [phone]  HPI  Past Medical History:  Diagnosis Date  . Anxiety   . Aspirin intolerance   . Bronchitis, mucopurulent recurrent (HCC)   . CAD (coronary artery disease)    2008, question slight anteroapical ischemia  . Carotid artery disease (HCC)    Doppler, December, 2007, bilateral 0-39%  . Dementia (HCC)    Senile dementia with delusional features  . Diabetes mellitus   . DJD (degenerative joint disease)   . Ejection fraction    EF 70%, nuclear, 2007  . Esophageal stricture   . GERD (gastroesophageal reflux disease)   . Hematoma    Large hematoma from fall, November, 2009, Coumadin stopped  . Hemorrhoids   . Hiatal hernia   . Hx of CABG    2003, ... LIMA not used... small caliber  . Hyperlipidemia   . Hypertension   . IBS (irritable bowel syndrome)   . Nephrolithiasis   . Obesity   . Osteoporosis   . Palpitations   . Pulmonary embolism (HCC)   . Right knee injury   . Senile  dementia with delusional features (HCC)   . SOB (shortness of breath)   . Subclavian arterial stenosis (HCC)    PCI  . Warfarin anticoagulation    stopped 09/2008 with large hematoma from fall    Patient Active Problem List   Diagnosis Date Noted  . Unspecified vitamin D deficiency 02/18/2013  . Hand fracture 08/21/2012  . Hand pain 08/20/2012  . UTI (lower urinary tract infection) 10/27/2011  . Fall 10/27/2011  . Hyperlipidemia 07/30/2011  . Hypertension   . Subclavian arterial stenosis (HCC)   . Diabetes mellitus (HCC)   . GERD (gastroesophageal reflux disease)   . Hx of CABG   . Carotid artery disease (HCC)   . Aspirin intolerance   . CAD (coronary artery disease)   . ESOPHAGEAL STRICTURE 03/01/2010  . DYSPHAGIA UNSPECIFIED 03/01/2010  . SENILE DEMENTIA WITH DELUSIONAL FEATURES 09/16/2008  . PERIPHERAL VASCULAR DISEASE 12/18/2007  . BRONCHITIS, RECURRENT 12/18/2007  . NEPHROLITHIASIS 12/18/2007  . OSTEOPOROSIS 12/18/2007  . Nonspecific (abnormal) findings on radiological and other examination of body structure 12/18/2007  . ABNORMAL CHEST XRAY 12/18/2007  . ANXIETY 11/10/2007  . IBS 11/10/2007  . DEGENERATIVE JOINT DISEASE 11/10/2007    Past Surgical History:  Procedure Laterality Date  . ABDOMINAL HYSTERECTOMY    . APPENDECTOMY    . cabg  1 vessel w/SVG to LAD  2003  . CATARACT EXTRACTION    . FRACTURE SURGERY    . HEMORRHOID SURGERY    . mult orthopedic procedures     Dr. Arnette Norris arm, CTS,cyst removal  . open heart surgery       OB History   None      Home Medications    Prior to Admission medications   Medication Sig Start Date End Date Taking? Authorizing Provider  aluminum-magnesium hydroxide-simethicone (MAALOX) 200-200-20 MG/5ML SUSP Take 30 mLs by mouth every 6 (six) hours as needed (heartburn or indigestion).   Yes [provider]  atorvastatin (LIPITOR) 20 MG tablet Take 20 mg by mouth every evening.    Yes [provider]  bumetanide (BUMEX) 0.5 MG tablet Take 0.5 mg by mouth daily.   Yes [provider]  Calcium 600-200 MG-UNIT tablet Take 1 tablet by mouth 2 (two) times daily.   Yes [provider]  chlorhexidine (PERIDEX) 0.12 % solution Use as directed 15 mLs in the mouth or throat daily.    Yes [provider]  clopidogrel (PLAVIX) 75 MG tablet TAKE 1 TABLET BY MOUTH EVERY DAY 10/16/11  Yes Luis Abed, MD  Dextromethorphan-guaiFENesin 5-100 MG/5ML LIQD Take 5 mLs by mouth every 6 (six) hours as needed (cough).   Yes [provider]  famotidine (PEPCID) 20 MG tablet Take 20 mg by mouth daily.   Yes [provider]  glimepiride (AMARYL) 1 MG tablet Take 1/2 tablet by mouth once daily Patient taking differently: Take 1 mg by mouth daily with breakfast.  06/18/11  Yes Michele Mcalpine, MD  guaifenesin (ROBITUSSIN) 100 MG/5ML syrup Take 200 mg by mouth 4 (four) times daily as needed for cough.   Yes [provider]  loperamide (IMODIUM) 2 MG capsule Take 2 mg by mouth daily as needed for diarrhea or loose stools.   Yes [provider]  losartan (COZAAR) 50 MG tablet Take 50 mg by mouth every evening.    Yes [provider]  magnesium hydroxide (MILK OF MAGNESIA) 400 MG/5ML suspension Take 30 mLs by mouth daily as needed for mild constipation.   Yes [provider]  metFORMIN (GLUCOPHAGE) 1000 MG tablet Take 1,000 mg by mouth 2 (two) times daily with a meal.   Yes [provider]  metoprolol succinate (TOPROL-XL) 25 MG 24 hr tablet Take 25 mg by mouth daily.   Yes [provider]  QUEtiapine (SEROQUEL) 25 MG tablet Take 0.5 tablets (12.5 mg total) by mouth at bedtime. 12/25/12  Yes Michele Mcalpine, MD  vitamin B-12 (CYANOCOBALAMIN) 1000 MCG tablet Take 1,000 mcg by mouth daily.   Yes [provider]  acetaminophen (TYLENOL) 325 MG tablet Take 650 mg by mouth every 6 (six) hours as needed for mild pain.     [provider]  bumetanide (BUMEX) 0.5 MG tablet Take 1 tablet (0.5 mg total) by mouth daily. Patient not taking: Reported on 10/11/2018 02/17/13   Michele Mcalpine, MD  ibandronate (BONIVA) 150 MG tablet Take 1 tablet (150 mg total) by mouth every 30 (thirty) days. Take in the morning with a full glass of water, on an empty stomach, and do not take anything else by mouth or lie down for the next 30 min. 06/18/11   Michele Mcalpine, MD    Family History Family History  Problem Relation Age of Onset  . Stroke Father   . Cancer Mother        bladder  cancer  . Hypertension Mother   . Stroke Mother   . Leukemia Sister   . Colon cancer Neg Hx     Social History Social History   Tobacco Use  . Smoking status: Never Smoker  . Smokeless tobacco: Never Used  Substance Use Topics  . Alcohol use: No  . Drug use: No     Allergies   Aspirin; Iodine; Irbesartan-hydrochlorothiazide; and Penicillins   Review of Systems Review of Systems  Unable to perform ROS: Dementia  Musculoskeletal: Positive for joint swelling.  Skin: Positive for rash.  All other systems reviewed and are negative.    Physical Exam Updated Vital Signs BP (!) 106/52   Pulse 84   Temp (!) 100.5 F (38.1 C) (Oral)   Resp (!) 30   SpO2 93%   Physical Exam  Constitutional: She appears well-developed and well-nourished.  Non toxic  HENT:  Head: Normocephalic and atraumatic.  Nose: Nose normal.  No facial, nasal, scalp bone tenderness.  Eyes: Pupils are equal, round, and reactive to light. Conjunctivae and EOM are normal.  Neck: Normal range of motion.  Cardiovascular: Normal rate and regular rhythm.  Pulmonary/Chest: Effort normal and breath sounds normal.  Abdominal: Soft. Bowel sounds are normal. There is no tenderness.  No G/R/R. No suprapubic or CVA tenderness. Negative Murphy's and McBurney's. Active BS to lower quadrants.   Musculoskeletal: Normal range of motion.  CT L-spine: No midline  tenderness.  No paraspinal muscle tenderness. Pelvis: No obvious instability with AP/L compression.  No tenderness to lateral hips bilaterally.  Full range of motion of hips without reported pain. No focal bony tenderness to the lower extremities.  Neurological: She is alert.  Skin: Skin is warm and dry. Capillary refill takes less than 2 seconds.  Diffuse, nontender, deep erythematous patches to the right lower extremity.  Mild diffuse erythema to the entire right leg.  Blanchable.  Psychiatric: She has a normal mood and affect. Her behavior is normal.  Nursing note and vitals reviewed.      ED Treatments / Results  Labs (all labs ordered are listed, but only abnormal results are displayed) Labs Reviewed  CBC WITH DIFFERENTIAL/PLATELET - Abnormal; Notable for the following components:      Result Value   WBC 16.4 (*)    Platelets 148 (*)    Neutro Abs 14.9 (*)    Lymphs Abs 0.6 (*)    Abs Immature Granulocytes 0.31 (*)    All other components within normal limits  BASIC METABOLIC PANEL - Abnormal; Notable for the following components:   Glucose, Bld 218 (*)    Creatinine, Ser 1.03 (*)    Calcium 8.7 (*)    GFR calc non Af Amer 49 (*)    GFR calc Af Amer 57 (*)    All other components within normal limits  URINALYSIS, ROUTINE W REFLEX MICROSCOPIC - Abnormal; Notable for the following components:   Ketones, ur 20 (*)    Protein, ur 30 (*)    All other components within normal limits  CULTURE, BLOOD (ROUTINE X 2)  CULTURE, BLOOD (ROUTINE X 2)  URINE CULTURE  I-STAT CG4 LACTIC ACID, ED  I-STAT CG4 LACTIC ACID, ED    EKG None  Radiology Dg Chest 1 View  Result Date: 10/11/2018 CLINICAL DATA:  Pt BIB EMS from [facility] with complaints of right leg redness unknown when it began. Redness extends from ankle to mid thigh. Hot to the touch. fever EXAM: CHEST  1 VIEW  COMPARISON:  None. FINDINGS: Sternotomy wires overlie stable cardiac silhouette. Small LEFT effusion is  increased. LEFT basilar atelectasis increased. RIGHT lung clear. IMPRESSION: LEFT lower lobe atelectasis and effusion. Cannot exclude LEFT lower lobe pneumonia. Electronically Signed   By: Genevive Bi M.D.   On: 10/11/2018 23:13   Dg Tibia/fibula Right  Result Date: 10/11/2018 CLINICAL DATA:  Pt BIB EMS from [facility] with complaints of right leg redness unknown when it began. Redness extends from ankle to mid thigh. Hot to the touch. redness to entire right leg; unknown if trauma EXAM: RIGHT TIBIA AND FIBULA - 2 VIEW COMPARISON:  None. FINDINGS: No fracture of the tibia or fibula. Knee joint and ankle joint appear normal on two views. IMPRESSION: No fracture or dislocation.  No osseous infection. Electronically Signed   By: Genevive Bi M.D.   On: 10/11/2018 23:17   Dg Knee Complete 4 Views Right  Result Date: 10/11/2018 CLINICAL DATA:  Pt BIB EMS from [facility] with complaints of right leg redness unknown when it began. Redness extends from ankle to mid thigh. Hot to the touch. redness to entire right leg; unknown if trauma EXAM: RIGHT KNEE - COMPLETE 4+ VIEW COMPARISON:  None. FINDINGS: Is No fracture of the proximal tibia or distal femur. Patella is normal. No joint effusion. IMPRESSION: No osseous infection evident.  No effusion Electronically Signed   By: Genevive Bi M.D.   On: 10/11/2018 23:17    Procedures Procedures (including critical care time)  Medications Ordered in ED Medications  metroNIDAZOLE (FLAGYL) IVPB 500 mg (0 mg Intravenous Stopped 10/11/18 2345)  ceFEPIme (MAXIPIME) 2 g in sodium chloride 0.9 % 100 mL IVPB (0 g Intravenous Stopped 10/11/18 2345)  vancomycin (VANCOCIN) IVPB 1000 mg/200 mL premix (1,000 mg Intravenous New Bag/Given 10/11/18 2307)  acetaminophen (TYLENOL) tablet 650 mg (650 mg Oral Given 10/11/18 2308)  0.9 %  sodium chloride infusion ( Intravenous New Bag/Given 10/11/18 2353)     Initial Impression / Assessment and Plan / ED Course  I  have reviewed the triage vital signs and the nursing notes.  Pertinent labs & imaging results that were available during my care of the patient were reviewed by me and considered in my medical decision making (see chart for details).  Clinical Course as of Oct 12 17  Sat Oct 11, 2018  2142 Temp(!): 100.5 F (38.1 C) [CG]  2142 WBC(!): 16.4 [CG]  2333  IMPRESSION: LEFT lower lobe atelectasis and effusion. Cannot exclude LEFT lower lobe pneumonia.    DG Chest 1 View [CG]    Clinical Course User Index [CG] Liberty Handy, PA-C    84 year old here with fever.  New onset of right leg swelling and rash.  No significant warmth to the right leg.  The rash is nontender.  It is blanchable.  Per [facility] staff, patient had a fall earlier this week.  Differential diagnosis includes cellulitis versus DVT versus vasculitis.  Given reported fall, trauma also in differential.  She is not tachycardic, tachypneic, hypoxic or hypotensive.  We will hold off on sepsis code at this time.  2340: WBC 16.4, normal lactic acid.  Febrile.  Creatinine 1.03.  No infection on urinalysis.  Chest x-ray shows left lower lobe effusion versus atelectasis, pneumonia not able to be excluded.  Fever could be from this.  She received broad-spectrum antibiotics in the ER.  I am not convinced rash is cellulitic given clinical exam.  Consider DVT study in the a.m.  Patient has been persistently tachypneic with SPO2 as low as 92%.  Final Clinical Impressions(s) / ED Diagnoses   Final diagnoses:  SIRS (systemic inflammatory response syndrome) (HCC)  Rash  Pleural effusion on left    ED Discharge Orders    None       Jerrell Mylar 10/12/18 0019    Azalia Bilis, MD 10/12/18 0025

## 2018-10-11 NOTE — ED Notes (Signed)
Bed: WA20 Expected date:  Expected time:  Means of arrival:  Comments: EMS/swelling/erythema r leg

## 2018-10-12 ENCOUNTER — Encounter (HOSPITAL_COMMUNITY): Payer: Self-pay

## 2018-10-12 ENCOUNTER — Inpatient Hospital Stay (HOSPITAL_COMMUNITY): Payer: Medicare Other

## 2018-10-12 DIAGNOSIS — Z7902 Long term (current) use of antithrombotics/antiplatelets: Secondary | ICD-10-CM | POA: Diagnosis not present

## 2018-10-12 DIAGNOSIS — K589 Irritable bowel syndrome without diarrhea: Secondary | ICD-10-CM | POA: Diagnosis present

## 2018-10-12 DIAGNOSIS — F0390 Unspecified dementia without behavioral disturbance: Secondary | ICD-10-CM | POA: Diagnosis present

## 2018-10-12 DIAGNOSIS — F0391 Unspecified dementia with behavioral disturbance: Secondary | ICD-10-CM | POA: Diagnosis present

## 2018-10-12 DIAGNOSIS — I110 Hypertensive heart disease with heart failure: Secondary | ICD-10-CM | POA: Diagnosis present

## 2018-10-12 DIAGNOSIS — Z6822 Body mass index (BMI) 22.0-22.9, adult: Secondary | ICD-10-CM | POA: Diagnosis not present

## 2018-10-12 DIAGNOSIS — Z66 Do not resuscitate: Secondary | ICD-10-CM | POA: Diagnosis present

## 2018-10-12 DIAGNOSIS — I5032 Chronic diastolic (congestive) heart failure: Secondary | ICD-10-CM | POA: Diagnosis present

## 2018-10-12 DIAGNOSIS — Z79899 Other long term (current) drug therapy: Secondary | ICD-10-CM | POA: Diagnosis not present

## 2018-10-12 DIAGNOSIS — M810 Age-related osteoporosis without current pathological fracture: Secondary | ICD-10-CM | POA: Diagnosis present

## 2018-10-12 DIAGNOSIS — I2510 Atherosclerotic heart disease of native coronary artery without angina pectoris: Secondary | ICD-10-CM | POA: Diagnosis present

## 2018-10-12 DIAGNOSIS — K219 Gastro-esophageal reflux disease without esophagitis: Secondary | ICD-10-CM | POA: Diagnosis present

## 2018-10-12 DIAGNOSIS — L03115 Cellulitis of right lower limb: Secondary | ICD-10-CM | POA: Diagnosis not present

## 2018-10-12 DIAGNOSIS — L538 Other specified erythematous conditions: Secondary | ICD-10-CM | POA: Diagnosis not present

## 2018-10-12 DIAGNOSIS — Z86711 Personal history of pulmonary embolism: Secondary | ICD-10-CM | POA: Diagnosis not present

## 2018-10-12 DIAGNOSIS — A419 Sepsis, unspecified organism: Secondary | ICD-10-CM | POA: Diagnosis not present

## 2018-10-12 DIAGNOSIS — I82401 Acute embolism and thrombosis of unspecified deep veins of right lower extremity: Secondary | ICD-10-CM | POA: Diagnosis present

## 2018-10-12 DIAGNOSIS — R21 Rash and other nonspecific skin eruption: Secondary | ICD-10-CM | POA: Diagnosis present

## 2018-10-12 DIAGNOSIS — D696 Thrombocytopenia, unspecified: Secondary | ICD-10-CM | POA: Diagnosis present

## 2018-10-12 DIAGNOSIS — E669 Obesity, unspecified: Secondary | ICD-10-CM | POA: Diagnosis present

## 2018-10-12 DIAGNOSIS — E119 Type 2 diabetes mellitus without complications: Secondary | ICD-10-CM | POA: Diagnosis present

## 2018-10-12 DIAGNOSIS — E785 Hyperlipidemia, unspecified: Secondary | ICD-10-CM | POA: Diagnosis present

## 2018-10-12 DIAGNOSIS — M7989 Other specified soft tissue disorders: Secondary | ICD-10-CM | POA: Diagnosis not present

## 2018-10-12 DIAGNOSIS — F419 Anxiety disorder, unspecified: Secondary | ICD-10-CM | POA: Diagnosis present

## 2018-10-12 DIAGNOSIS — E876 Hypokalemia: Secondary | ICD-10-CM | POA: Diagnosis present

## 2018-10-12 DIAGNOSIS — Z951 Presence of aortocoronary bypass graft: Secondary | ICD-10-CM | POA: Diagnosis not present

## 2018-10-12 DIAGNOSIS — R609 Edema, unspecified: Secondary | ICD-10-CM | POA: Diagnosis not present

## 2018-10-12 DIAGNOSIS — I708 Atherosclerosis of other arteries: Secondary | ICD-10-CM | POA: Diagnosis present

## 2018-10-12 DIAGNOSIS — Z515 Encounter for palliative care: Secondary | ICD-10-CM | POA: Diagnosis present

## 2018-10-12 LAB — CBC WITH DIFFERENTIAL/PLATELET
Abs Immature Granulocytes: 0.18 10*3/uL — ABNORMAL HIGH (ref 0.00–0.07)
BASOS PCT: 0 %
Basophils Absolute: 0.1 10*3/uL (ref 0.0–0.1)
Eosinophils Absolute: 0 10*3/uL (ref 0.0–0.5)
Eosinophils Relative: 0 %
HCT: 35.2 % — ABNORMAL LOW (ref 36.0–46.0)
Hemoglobin: 11.2 g/dL — ABNORMAL LOW (ref 12.0–15.0)
Immature Granulocytes: 1 %
LYMPHS ABS: 1 10*3/uL (ref 0.7–4.0)
Lymphocytes Relative: 6 %
MCH: 31.6 pg (ref 26.0–34.0)
MCHC: 31.8 g/dL (ref 30.0–36.0)
MCV: 99.4 fL (ref 80.0–100.0)
Monocytes Absolute: 0.7 10*3/uL (ref 0.1–1.0)
Monocytes Relative: 4 %
Neutro Abs: 13.7 10*3/uL — ABNORMAL HIGH (ref 1.7–7.7)
Neutrophils Relative %: 89 %
Platelets: 129 10*3/uL — ABNORMAL LOW (ref 150–400)
RBC: 3.54 MIL/uL — AB (ref 3.87–5.11)
RDW: 13.1 % (ref 11.5–15.5)
WBC: 15.6 10*3/uL — ABNORMAL HIGH (ref 4.0–10.5)
nRBC: 0 % (ref 0.0–0.2)

## 2018-10-12 LAB — MRSA PCR SCREENING: MRSA by PCR: NEGATIVE

## 2018-10-12 LAB — BASIC METABOLIC PANEL
Anion gap: 9 (ref 5–15)
BUN: 28 mg/dL — ABNORMAL HIGH (ref 8–23)
CO2: 24 mmol/L (ref 22–32)
Calcium: 8.4 mg/dL — ABNORMAL LOW (ref 8.9–10.3)
Chloride: 110 mmol/L (ref 98–111)
Creatinine, Ser: 1 mg/dL (ref 0.44–1.00)
GFR calc Af Amer: 59 mL/min — ABNORMAL LOW (ref >60)
GFR calc non Af Amer: 51 mL/min — ABNORMAL LOW (ref >60)
GLUCOSE: 213 mg/dL — AB (ref 70–99)
Potassium: 3.1 mmol/L — ABNORMAL LOW (ref 3.5–5.1)
Sodium: 143 mmol/L (ref 135–145)

## 2018-10-12 LAB — ECHOCARDIOGRAM COMPLETE
Height: 63 in
Weight: 2052.92 oz

## 2018-10-12 LAB — PROTIME-INR
INR: 1.08
PROTHROMBIN TIME: 13.9 s (ref 11.4–15.2)

## 2018-10-12 LAB — GLUCOSE, CAPILLARY
Glucose-Capillary: 167 mg/dL — ABNORMAL HIGH (ref 70–99)
Glucose-Capillary: 140 mg/dL — ABNORMAL HIGH (ref 70–99)
Glucose-Capillary: 150 mg/dL — ABNORMAL HIGH (ref 70–99)

## 2018-10-12 LAB — APTT: aPTT: 31 seconds (ref 24–36)

## 2018-10-12 LAB — HEPARIN LEVEL (UNFRACTIONATED): Heparin Unfractionated: <0.1 IU/mL — ABNORMAL LOW (ref 0.30–0.70)

## 2018-10-12 LAB — BRAIN NATRIURETIC PEPTIDE: B Natriuretic Peptide: 287.8 pg/mL — ABNORMAL HIGH (ref 0.0–100.0)

## 2018-10-12 MED ORDER — BUMETANIDE 0.5 MG PO TABS
0.5000 mg | ORAL_TABLET | Freq: Every day | ORAL | Status: DC
  Filled 2018-10-12: qty 1

## 2018-10-12 MED ORDER — IOPAMIDOL (ISOVUE-370) INJECTION 76%
100.0000 mL | Freq: Once | INTRAVENOUS | Status: AC | PRN
  Administered 2018-10-12: 100 mL via INTRAVENOUS

## 2018-10-12 MED ORDER — VANCOMYCIN HCL 500 MG IV SOLR
500.0000 mg | INTRAVENOUS | Status: DC
  Administered 2018-10-12 – 2018-10-13 (×2): 500 mg via INTRAVENOUS
  Filled 2018-10-12 (×2): qty 500

## 2018-10-12 MED ORDER — SODIUM CHLORIDE 0.9 % IV SOLN
INTRAVENOUS | Status: DC
  Administered 2018-10-12 – 2018-10-13 (×2): via INTRAVENOUS

## 2018-10-12 MED ORDER — CLOPIDOGREL BISULFATE 75 MG PO TABS
75.0000 mg | ORAL_TABLET | Freq: Every day | ORAL | Status: DC
  Administered 2018-10-12 – 2018-10-14 (×3): 75 mg via ORAL
  Filled 2018-10-12 (×3): qty 1

## 2018-10-12 MED ORDER — ACETAMINOPHEN 325 MG PO TABS
650.0000 mg | ORAL_TABLET | Freq: Four times a day (QID) | ORAL | Status: DC | PRN
  Administered 2018-10-15 – 2018-10-16 (×2): 650 mg via ORAL
  Filled 2018-10-12 (×5): qty 2

## 2018-10-12 MED ORDER — FAMOTIDINE 20 MG PO TABS
20.0000 mg | ORAL_TABLET | Freq: Every day | ORAL | Status: DC
  Administered 2018-10-12 – 2018-10-17 (×6): 20 mg via ORAL
  Filled 2018-10-12 (×6): qty 1

## 2018-10-12 MED ORDER — HEPARIN (PORCINE) 25000 UT/250ML-% IV SOLN: 10.0000 [IU]/kg/h | INTRAVENOUS | Status: DC

## 2018-10-12 MED ORDER — IOPAMIDOL (ISOVUE-370) INJECTION 76%
INTRAVENOUS | Status: AC
  Filled 2018-10-12: qty 100

## 2018-10-12 MED ORDER — HEPARIN BOLUS VIA INFUSION
2000.0000 [IU] | Freq: Once | INTRAVENOUS | Status: AC
  Administered 2018-10-12: 2000 [IU] via INTRAVENOUS
  Filled 2018-10-12: qty 2000

## 2018-10-12 MED ORDER — HEPARIN (PORCINE) 25000 UT/250ML-% IV SOLN
950.0000 [IU]/h | INTRAVENOUS | Status: DC
  Administered 2018-10-12: 950 [IU]/h via INTRAVENOUS
  Filled 2018-10-12: qty 250

## 2018-10-12 MED ORDER — ENOXAPARIN SODIUM 30 MG/0.3ML ~~LOC~~ SOLN
30.0000 mg | SUBCUTANEOUS | Status: DC
  Administered 2018-10-12: 30 mg via SUBCUTANEOUS
  Filled 2018-10-12: qty 0.3

## 2018-10-12 MED ORDER — QUETIAPINE FUMARATE 25 MG PO TABS
12.5000 mg | ORAL_TABLET | Freq: Every day | ORAL | Status: DC
  Administered 2018-10-12 – 2018-10-16 (×5): 12.5 mg via ORAL
  Filled 2018-10-12 (×5): qty 1

## 2018-10-12 MED ORDER — ATORVASTATIN CALCIUM 20 MG PO TABS
20.0000 mg | ORAL_TABLET | Freq: Every evening | ORAL | Status: DC
  Administered 2018-10-12 – 2018-10-15 (×4): 20 mg via ORAL
  Filled 2018-10-12 (×4): qty 1

## 2018-10-12 MED ORDER — SODIUM CHLORIDE (PF) 0.9 % IJ SOLN
INTRAMUSCULAR | Status: AC
  Filled 2018-10-12: qty 50

## 2018-10-12 MED ORDER — SODIUM CHLORIDE 0.9 % IV SOLN
INTRAVENOUS | Status: DC | PRN
  Administered 2018-10-12: 250 mL via INTRAVENOUS
  Administered 2018-10-16: 1000 mL via INTRAVENOUS

## 2018-10-12 MED ORDER — ENOXAPARIN SODIUM 40 MG/0.4ML ~~LOC~~ SOLN
40.0000 mg | SUBCUTANEOUS | Status: DC
  Filled 2018-10-12: qty 0.4

## 2018-10-12 MED ORDER — SODIUM CHLORIDE 0.9 % IV SOLN
1.0000 g | Freq: Two times a day (BID) | INTRAVENOUS | Status: DC
  Administered 2018-10-12 – 2018-10-13 (×4): 1 g via INTRAVENOUS
  Filled 2018-10-12 (×5): qty 1

## 2018-10-12 MED ORDER — HALOPERIDOL LACTATE 5 MG/ML IJ SOLN
5.0000 mg | Freq: Once | INTRAMUSCULAR | Status: AC
  Administered 2018-10-12: 5 mg via INTRAVENOUS
  Filled 2018-10-12: qty 1

## 2018-10-12 MED ORDER — VANCOMYCIN HCL 10 G IV SOLR: 1250.0000 mg | INTRAVENOUS | Status: DC

## 2018-10-12 MED ORDER — INSULIN ASPART 100 UNIT/ML ~~LOC~~ SOLN
0.0000 [IU] | Freq: Three times a day (TID) | SUBCUTANEOUS | Status: DC
  Administered 2018-10-12: 1 [IU] via SUBCUTANEOUS
  Administered 2018-10-13: 5 [IU] via SUBCUTANEOUS
  Administered 2018-10-14 – 2018-10-15 (×2): 1 [IU] via SUBCUTANEOUS

## 2018-10-12 MED ORDER — CHLORHEXIDINE GLUCONATE 0.12 % MT SOLN
15.0000 mL | Freq: Every day | OROMUCOSAL | Status: DC
  Administered 2018-10-12 – 2018-10-16 (×5): 15 mL via OROMUCOSAL
  Filled 2018-10-12 (×7): qty 15

## 2018-10-12 MED ORDER — POTASSIUM CHLORIDE CRYS ER 20 MEQ PO TBCR
40.0000 meq | EXTENDED_RELEASE_TABLET | Freq: Once | ORAL | Status: AC
  Administered 2018-10-12: 40 meq via ORAL
  Filled 2018-10-12: qty 2

## 2018-10-12 NOTE — Progress Notes (Signed)
Pharmacy Antibiotic Note  [name] is a 84 y.o. female admitted on 10/11/2018 with cellulitis.  In the ED patient received Vancomycin 1gm and Cefepime 2gm IV x 1 dose each.  Upon admission, Pharmacy has been consulted for Vancomycin dosing.  MD has continued Cefepime as well.  Plan:  Vancomycin 1250mg  IV q48h (goal AUC 400-500)  Cefepime per MD  Follow renal function  F/U culture results and sensitivities  Height: 5\' 8"  (172.7 cm) IBW/kg (Calculated) : 63.9  Temp (24hrs), Avg:99.3 F (37.4 C), Min:98.1 F (36.7 C), Max:100.5 F (38.1 C)  Recent Labs  Lab 10/11/18 2035 10/11/18 2045  WBC 16.4*  --   CREATININE 1.03*  --   LATICACIDVEN  --  1.37    CrCl cannot be calculated (Unknown ideal weight.).    Allergies  Allergen Reactions  . Aspirin     REACTION: pt states GI INTOL to aspirin  . Iodine     REACTION: GI UPSET  . Irbesartan-Hydrochlorothiazide     REACTION: GI UPSET  . Penicillins     REACTION: GI UPSET Has patient had a PCN reaction causing immediate rash, facial/tongue/throat swelling, SOB or lightheadedness with hypotension: No Has patient had a PCN reaction causing severe rash involving mucus membranes or skin necrosis: No Has patient had a PCN reaction that required hospitalization: No Has patient had a PCN reaction occurring within the last 10 years: No If all of the above answers are "NO", then may proceed with Cephalosporin use.    Antimicrobials this admission: 12/7 vanc >>   12/7 cefepime >>    Dose adjustments this admission:    Microbiology results: 12/7 BCx: sent 12/7 UCx: sent   Thank you for allowing pharmacy to be a part of this patient's care.  Maryellen PilePoindexter, Lizania Bouchard Trefz, PharmD 10/12/2018 2:19 AM

## 2018-10-12 NOTE — Progress Notes (Signed)
Pt. Continuing to pull out IV's and continuous pulse ox. Pulled out several times. Will not reconnect her to pulse ox. Sating 94 on room air.

## 2018-10-12 NOTE — Progress Notes (Signed)
Pharmacy Antibiotic Note  [name] is a 84 y.o. female admitted on 10/11/2018 with cellulitis. In the ED patient received Vancomycin 1gm and Cefepime 2gm IV x 1 dose each. Upon admission, Pharmacy has been consulted for Vancomycin dosing. MD has continued Cefepime as well.  Plan:  Will adjust vancomycin to 500 mg IV q24 hr (AUC 419 with SCr 1)  Cefepime 1g q12 hr per MD, dosing appropriate  Follow renal function  F/U culture results and sensitivities  Height: 5\' 3"  (160 cm) Weight: 128 lb 4.9 oz (58.2 kg) IBW/kg (Calculated) : 52.4  Temp (24hrs), Avg:98.6 F (37 C), Min:97.9 F (36.6 C), Max:100.5 F (38.1 C)  Recent Labs  Lab 10/11/18 2035 10/11/18 2045 10/12/18 0224  WBC 16.4*  --  15.6*  CREATININE 1.03*  --  1.00  LATICACIDVEN  --  1.37  --     Estimated Creatinine Clearance: 32.8 mL/min (by C-G formula based on SCr of 1 mg/dL).    Allergies  Allergen Reactions  . Aspirin     REACTION: pt states GI INTOL to aspirin  . Iodine     REACTION: GI UPSET  . Irbesartan-Hydrochlorothiazide     REACTION: GI UPSET  . Penicillins     REACTION: GI UPSET Has patient had a PCN reaction causing immediate rash, facial/tongue/throat swelling, SOB or lightheadedness with hypotension: No Has patient had a PCN reaction causing severe rash involving mucus membranes or skin necrosis: No Has patient had a PCN reaction that required hospitalization: No Has patient had a PCN reaction occurring within the last 10 years: No If all of the above answers are "NO", then may proceed with Cephalosporin use.    Antimicrobials this admission: 12/7 vanc >>   12/7 cefepime >>    Dose adjustments this admission: 12/8: adjust vanc from 1250 q48 to 500 q24  Microbiology results: 12/7 BCx: sent 12/7 UCx: sent   Thank you for allowing pharmacy to be a part of this patient's care.  Bernadene Personrew [name], PharmD, BCPS [phone] 10/12/2018, 12:31 PM

## 2018-10-12 NOTE — Progress Notes (Signed)
PROGRESS NOTE  [name] ZOX:[id number] DOB: 11/07/1928 DOA: 10/11/2018 PCP: Kristian Covey, MD  HPI/Recap of past 24 hours: HPI from Dr Rocco Serene is a 84 y.o. female with CAD s/p CABG, subclavian arterial stenosis, hx of PE, prior warfarin use (discontinued due to fall and hematoma), HTN, dementia, who presented from [facility] nursing facility after acute R lower extremity rash and swelling. History mostly obtained from ED who communicated with [facility] staff. Patient reportedly had a mechanical fall earlier in the week without clear injury. Per staff unclear when rash began, although first noted on evening of admission. Patient herself denies any chest pain, fevers, cough, or other symptoms -- limited history due to dementia. On arrival to [facility] ED, temperature 100.5 with leukocytosis. Pt admitted for further management.   Today, pt denies any new complaints (although limited by dementia), looks comfortable, alert awake.  Earlier today, rapid response was called as pt was noted to be lethargic with BP of 97/41. Pt shortly recovered without any interventions.  Assessment/Plan: Active Problems:   Rash   Leg swelling  Sepsis 2/2 ?RLE cellulitis Febrile, with leukocytosis on admission Currently afebrile, with leukocytosis Lactic acid 1.37 BC x2 pending X-ray right lower extremity no fracture or dislocation, no osseous infx Venous Doppler pending IV fluids Continue IV vancomycin, cefepime, Flagyl, plan to de-escalate  Hypokalemia Replace PRN  Chronic diastolic HF BNP 287, above from baseline Chest x-ray showed left lower lobe atelectasis, cannot exclude pneumonia CTA chest negative for PE, no other acute abnormality Echo done 10/2018 showed EF of 60 to 65%, grade 1 diastolic dysfunction Will hold home Bumex, losartan, Toprol due to soft BP  History of CAD, s/p CABG Denies any chest pain Continue Lipitor, Plavix  Diabetes mellitus type 2 A1c  pending SSI, Accu-Checks, hypoglycemic protocol Hold home glimepiride, metformin  Hypertension BP soft Continue to hold Bumex, losartan, Toprol  History of dementia Continue Seroquel          Malnutrition Type:      Malnutrition Characteristics:      Nutrition Interventions:       Estimated body mass index is 22.73 kg/m as calculated from the following:   Height as of this encounter: 5\' 3"  (1.6 m).   Weight as of this encounter: 58.2 kg.     Code Status: Full  Family Communication: None at bedside  Disposition Plan: To be determined   Consultants:  None  Procedures:  None  Antimicrobials:  Vancomycin  Cefepime  DVT prophylaxis: Lovenox   Objective: Vitals:   10/12/18 0115 10/12/18 0207 10/12/18 0245 10/12/18 0403  BP: (!) 119/50 (!) 104/47 (!) 97/41 (!) 121/48  Pulse: 82 63 67 65  Resp: (!) 32 18  18  Temp:  98.1 F (36.7 C) 97.9 F (36.6 C) 97.9 F (36.6 C)  TempSrc:  Oral Oral Oral  SpO2: 93% 94% 99% 100%  Weight:  58.2 kg    Height: 5\' 8"  (1.727 m) 5\' 3"  (1.6 m)      Intake/Output Summary (Last 24 hours) at 10/12/2018 1313 Last data filed at 10/12/2018 1127 Gross per 24 hour  Intake 891.37 ml  Output -  Net 891.37 ml   Filed Weights   10/12/18 0207  Weight: 58.2 kg    Exam:   General: NAD, alert, awake  Cardiovascular: S1, S2 present  Respiratory: CTAB  Abdomen: Soft, nontender, nondistended, bowel sounds present  Musculoskeletal: Right lower extremity erythema up to mid thigh, warmth, edema,  LLE appears stable/normal  Skin: Right lower extremity erythema  Psychiatry: Unable to assess   Data Reviewed: CBC: Recent Labs  Lab 10/11/18 2035 10/12/18 0224  WBC 16.4* 15.6*  NEUTROABS 14.9* 13.7*  HGB 12.1 11.2*  HCT 37.8 35.2*  MCV 96.4 99.4  PLT 148* 129*   Basic Metabolic Panel: Recent Labs  Lab 10/11/18 2035 10/12/18 0224  NA 142 143  K 3.7 3.1*  CL 108 110  CO2 24 24  GLUCOSE 218* 213*   BUN 23 28*  CREATININE 1.03* 1.00  CALCIUM 8.7* 8.4*   GFR: Estimated Creatinine Clearance: 32.8 mL/min (by C-G formula based on SCr of 1 mg/dL). Liver Function Tests: No results for input(s): AST, ALT, ALKPHOS, BILITOT, PROT, ALBUMIN in the last 168 hours. No results for input(s): LIPASE, AMYLASE in the last 168 hours. No results for input(s): AMMONIA in the last 168 hours. Coagulation Profile: Recent Labs  Lab 10/12/18 0224  INR 1.08   Cardiac Enzymes: No results for input(s): CKTOTAL, CKMB, CKMBINDEX, TROPONINI in the last 168 hours. BNP (last 3 results) No results for input(s): PROBNP in the last 8760 hours. HbA1C: No results for input(s): HGBA1C in the last 72 hours. CBG: Recent Labs  Lab 10/12/18 0248  GLUCAP 167*   Lipid Profile: No results for input(s): CHOL, HDL, LDLCALC, TRIG, CHOLHDL, LDLDIRECT in the last 72 hours. Thyroid Function Tests: No results for input(s): TSH, T4TOTAL, FREET4, T3FREE, THYROIDAB in the last 72 hours. Anemia Panel: No results for input(s): VITAMINB12, FOLATE, FERRITIN, TIBC, IRON, RETICCTPCT in the last 72 hours. Urine analysis:    Component Value Date/Time   COLORURINE YELLOW 10/11/2018 2213   APPEARANCEUR CLEAR 10/11/2018 2213   LABSPEC 1.027 10/11/2018 2213   PHURINE 6.0 10/11/2018 2213   GLUCOSEU NEGATIVE 10/11/2018 2213   GLUCOSEU NEGATIVE 01/16/2010 1636   HGBUR NEGATIVE 10/11/2018 2213   BILIRUBINUR NEGATIVE 10/11/2018 2213   KETONESUR 20 (A) 10/11/2018 2213   PROTEINUR 30 (A) 10/11/2018 2213   UROBILINOGEN 0.2 10/27/2011 1102   NITRITE NEGATIVE 10/11/2018 2213   LEUKOCYTESUR NEGATIVE 10/11/2018 2213   Sepsis Labs: @LABRCNTIP (procalcitonin:4,lacticidven:4)  ) Recent Results (from the past 240 hour(s))  MRSA PCR Screening     Status: None   Collection Time: 10/12/18  3:27 AM  Result Value Ref Range Status   MRSA by PCR NEGATIVE NEGATIVE Final    Comment:        The GeneXpert MRSA Assay (FDA approved for NASAL  specimens only), is one component of a comprehensive MRSA colonization surveillance program. It is not intended to diagnose MRSA infection nor to guide or monitor treatment for MRSA infections. Performed at [facility], [address] [place], [place] [address]       Studies: Dg Chest 1 View  Result Date: 10/11/2018 CLINICAL DATA:  Pt BIB EMS from [facility] with complaints of right leg redness unknown when it began. Redness extends from ankle to mid thigh. Hot to the touch. fever EXAM: CHEST  1 VIEW COMPARISON:  None. FINDINGS: Sternotomy wires overlie stable cardiac silhouette. Small LEFT effusion is increased. LEFT basilar atelectasis increased. RIGHT lung clear. IMPRESSION: LEFT lower lobe atelectasis and effusion. Cannot exclude LEFT lower lobe pneumonia. Electronically Signed   By: Genevive Bi M.D.   On: 10/11/2018 23:13   Dg Tibia/fibula Right  Result Date: 10/11/2018 CLINICAL DATA:  Pt BIB EMS from [facility] with complaints of right leg redness unknown when it began. Redness extends from ankle to mid thigh.  Hot to the touch. redness to entire right leg; unknown if trauma EXAM: RIGHT TIBIA AND FIBULA - 2 VIEW COMPARISON:  None. FINDINGS: No fracture of the tibia or fibula. Knee joint and ankle joint appear normal on two views. IMPRESSION: No fracture or dislocation.  No osseous infection. Electronically Signed   By: Genevive BiStewart  Edmunds M.D.   On: 10/11/2018 23:17   Ct Angio Chest Pe W Or Wo Contrast  Result Date: 10/12/2018 CLINICAL DATA:  84 year old female with history of PE presenting with dyspnea. EXAM: CT ANGIOGRAPHY CHEST WITH CONTRAST TECHNIQUE: Multidetector CT imaging of the chest was performed using the standard protocol during bolus administration of intravenous contrast. Multiplanar CT image reconstructions and MIPs were obtained to evaluate the vascular anatomy. CONTRAST:  100mL ISOVUE-370 IOPAMIDOL (ISOVUE-370) INJECTION 76% COMPARISON:   Chest radiograph dated 10/11/2018 FINDINGS: Evaluation is limited due to streak artifact caused by patient's arms. Cardiovascular: There is mild cardiomegaly. Multi vessel coronary vascular calcification. Moderate atherosclerotic calcification of the thoracic aorta. Left subclavian artery stent is suboptimally evaluated. Evaluation of the pulmonary arteries is limited due to respiratory motion artifact and suboptimal opacification of the peripheral branches. No large or central pulmonary artery embolus identified. Mediastinum/Nodes: There is no hilar or mediastinal adenopathy. There is a small hiatal hernia. Esophagus is grossly unremarkable. No mediastinal fluid collection. Lungs/Pleura: There is an 8 mm ground-glass nodule in the right lower lobe (series 6, image 65). Minimal bibasilar linear atelectasis/scarring. There is no focal consolidation, pleural effusion, or pneumothorax. The central airways are patent. Upper Abdomen: Cholecystectomy. The visualized upper abdomen is otherwise unremarkable. Musculoskeletal: Osteopenia with degenerative changes of the spine. Minimal old appearing lower thoracic compression deformity. Median sternotomy wires. No acute osseous pathology. Review of the MIP images confirms the above findings. IMPRESSION: 1. No CT evidence of central pulmonary artery embolus. 2. A 8 mm ground-glass nodule in the right lower lobe. Initial follow-up with CT at 6-12 months is recommended to confirm persistence. If persistent, repeat CT is recommended every 2 years until 5 years of stability has been established. This recommendation follows the consensus statement: Guidelines for Management of Incidental Pulmonary Nodules Detected on CT Images: From the Fleischner Society 2017; Radiology 2017; 284:228-243. Electronically Signed   By: Elgie CollardArash  Radparvar M.D.   On: 10/12/2018 05:19   Dg Knee Complete 4 Views Right  Result Date: 10/11/2018 CLINICAL DATA:  Pt BIB EMS from [facility] with  complaints of right leg redness unknown when it began. Redness extends from ankle to mid thigh. Hot to the touch. redness to entire right leg; unknown if trauma EXAM: RIGHT KNEE - COMPLETE 4+ VIEW COMPARISON:  None. FINDINGS: Is No fracture of the proximal tibia or distal femur. Patella is normal. No joint effusion. IMPRESSION: No osseous infection evident.  No effusion Electronically Signed   By: Genevive BiStewart  Edmunds M.D.   On: 10/11/2018 23:17    Scheduled Meds: . chlorhexidine  15 mL Mouth/Throat Daily  . enoxaparin (LOVENOX) injection  30 mg Subcutaneous Q24H  . iopamidol      . QUEtiapine  12.5 mg Oral QHS  . sodium chloride (PF)        Continuous Infusions: . sodium chloride 250 mL (10/12/18 16100632)  . ceFEPime (MAXIPIME) IV 1 g (10/12/18 1036)  . metronidazole 500 mg (10/12/18 [id number])  . vancomycin       LOS: 0 days     Briant CedarNkeiruka J Muhanad Torosyan, MD [facility]  If 7PM-7AM, please contact night-coverage [email or web address] 10/12/2018, 1:13 PM

## 2018-10-12 NOTE — Progress Notes (Signed)
  Echocardiogram 2D Echocardiogram has been performed.  Celene SkeenVijay  Marion Seese 10/12/2018, 9:54 AM

## 2018-10-12 NOTE — Progress Notes (Signed)
Pt arrived to unit at 0145 from ED. At this time pt was alert, awake, and talking. Pt became more lethargic and was barely responding to voice. BP 97/41. 2L O2 was placed on pt. Rapid response was called. And MD notified. No further interventions needed at this time. Follow up BP 121/48. Pt sleeping, answering questions appropriately, pt placed on continuous pulse ox, and being closely monitored. Will continue to monitor pt.

## 2018-10-12 NOTE — ED Notes (Signed)
ED TO INPATIENT HANDOFF REPORT  Name/Age/Gender [name] 84 y.o. female  Code Status    Code Status Orders  (From admission, onward)         Start     Ordered   10/12/18 0032  Full code  Continuous     10/12/18 0036        Code Status History    Date Active Date Inactive Code Status Order ID Comments User Context   10/27/2011 1521 11/02/2011 1842 Full Code [id number]  Judi Saa, RN Inpatient      Home/SNF/Other Nursing Home  Chief Complaint leg swelling  Level of Care/Admitting Diagnosis ED Disposition    ED Disposition Condition Comment   Admit  Hospital Area: [facility] [[id number]]  Level of Care: Telemetry [5]  Admit to tele based on following criteria: Monitor for Ischemic changes  Admit to tele based on following criteria: Other see comments  Comments: cont pulse ox, fluid overload  Diagnosis: Rash [[id number]]  Admitting Physician: Ike Bene [[id number]]  Attending Physician: Ike Bene [[id number]]  Estimated length of stay: past midnight tomorrow  Certification:: I certify this patient will need inpatient services for at least 2 midnights  PT Class (Do Not Modify): Inpatient [101]  PT Acc Code (Do Not Modify): Private [1]       Medical History Past Medical History:  Diagnosis Date  . Anxiety   . Aspirin intolerance   . Bronchitis, mucopurulent recurrent (HCC)   . CAD (coronary artery disease)    2008, question slight anteroapical ischemia  . Carotid artery disease (HCC)    Doppler, December, 2007, bilateral 0-39%  . Dementia (HCC)    Senile dementia with delusional features  . Diabetes mellitus   . DJD (degenerative joint disease)   . Ejection fraction    EF 70%, nuclear, 2007  . Esophageal stricture   . GERD (gastroesophageal reflux disease)   . Hematoma    Large hematoma from fall, November, 2009, Coumadin stopped  . Hemorrhoids   . Hiatal hernia   . Hx of CABG    2003, ... LIMA not used... small  caliber  . Hyperlipidemia   . Hypertension   . IBS (irritable bowel syndrome)   . Nephrolithiasis   . Obesity   . Osteoporosis   . Palpitations   . Pulmonary embolism (HCC)   . Right knee injury   . Senile dementia with delusional features (HCC)   . SOB (shortness of breath)   . Subclavian arterial stenosis (HCC)    PCI  . Warfarin anticoagulation    stopped 09/2008 with large hematoma from fall    Allergies Allergies  Allergen Reactions  . Aspirin     REACTION: pt states GI INTOL to aspirin  . Iodine     REACTION: GI UPSET  . Irbesartan-Hydrochlorothiazide     REACTION: GI UPSET  . Penicillins     REACTION: GI UPSET Has patient had a PCN reaction causing immediate rash, facial/tongue/throat swelling, SOB or lightheadedness with hypotension: No Has patient had a PCN reaction causing severe rash involving mucus membranes or skin necrosis: No Has patient had a PCN reaction that required hospitalization: No Has patient had a PCN reaction occurring within the last 10 years: No If all of the above answers are "NO", then may proceed with Cephalosporin use.    IV Location/Drains/Wounds Patient Lines/Drains/Airways Status   Active Line/Drains/Airways    Name:   Placement date:   Placement time:  Site:   Days:   Peripheral IV 10/11/18 Right Hand   10/11/18    2034    Hand   1   Peripheral IV 10/11/18 Left;Posterior;Distal Forearm   10/11/18    2227    Forearm   1          Labs/Imaging Results for orders placed or performed during the hospital encounter of 10/11/18 (from the past 48 hour(s))  CBC with Differential     Status: Abnormal   Collection Time: 10/11/18  8:35 PM  Result Value Ref Range   WBC 16.4 (H) 4.0 - 10.5 K/uL    Comment: WHITE COUNT CONFIRMED ON SMEAR   RBC 3.92 3.87 - 5.11 MIL/uL   Hemoglobin 12.1 12.0 - 15.0 g/dL   HCT 40.9 81.1 - 91.4 %   MCV 96.4 80.0 - 100.0 fL   MCH 30.9 26.0 - 34.0 pg   MCHC 32.0 30.0 - 36.0 g/dL   RDW 78.2 95.6 - 21.3 %    Platelets 148 (L) 150 - 400 K/uL   nRBC 0.0 0.0 - 0.2 %   Neutrophils Relative % 91 %   Neutro Abs 14.9 (H) 1.7 - 7.7 K/uL   Lymphocytes Relative 3 %   Lymphs Abs 0.6 (L) 0.7 - 4.0 K/uL   Monocytes Relative 4 %   Monocytes Absolute 0.6 0.1 - 1.0 K/uL   Eosinophils Relative 0 %   Eosinophils Absolute 0.0 0.0 - 0.5 K/uL   Basophils Relative 0 %   Basophils Absolute 0.0 0.0 - 0.1 K/uL   Immature Granulocytes 2 %   Abs Immature Granulocytes 0.31 (H) 0.00 - 0.07 K/uL   Dohle Bodies PRESENT     Comment: Performed at [facility], [address], [place], [place] [address]  Basic metabolic panel     Status: Abnormal   Collection Time: 10/11/18  8:35 PM  Result Value Ref Range   Sodium 142 135 - 145 mmol/L   Potassium 3.7 3.5 - 5.1 mmol/L   Chloride 108 98 - 111 mmol/L   CO2 24 22 - 32 mmol/L   Glucose, Bld 218 (H) 70 - 99 mg/dL   BUN 23 8 - 23 mg/dL   Creatinine, Ser 8.46 (H) 0.44 - 1.00 mg/dL   Calcium 8.7 (L) 8.9 - 10.3 mg/dL   GFR calc non Af Amer 49 (L) >60 mL/min   GFR calc Af Amer 57 (L) >60 mL/min   Anion gap 10 5 - 15    Comment: Performed at [facility], [address], [place], [place] [address]  I-Stat CG4 Lactic Acid, ED     Status: None   Collection Time: 10/11/18  8:45 PM  Result Value Ref Range   Lactic Acid, Venous 1.37 0.5 - 1.9 mmol/L  Urinalysis, Routine w reflex microscopic     Status: Abnormal   Collection Time: 10/11/18 10:13 PM  Result Value Ref Range   Color, Urine YELLOW YELLOW   APPearance CLEAR CLEAR   Specific Gravity, Urine 1.027 1.005 - 1.030   pH 6.0 5.0 - 8.0   Glucose, UA NEGATIVE NEGATIVE mg/dL   Hgb urine dipstick NEGATIVE NEGATIVE   Bilirubin Urine NEGATIVE NEGATIVE   Ketones, ur 20 (A) NEGATIVE mg/dL   Protein, ur 30 (A) NEGATIVE mg/dL   Nitrite NEGATIVE NEGATIVE   Leukocytes, UA NEGATIVE NEGATIVE   RBC / HPF 0-5 0 - 5 RBC/hpf   WBC, UA 0-5 0 - 5 WBC/hpf   Bacteria, UA  NONE SEEN NONE SEEN    Squamous Epithelial / LPF 0-5 0 - 5   Mucus PRESENT     Comment: Performed at [facility], [address] [place], [place] [address]   Dg Chest 1 View  Result Date: 10/11/2018 CLINICAL DATA:  Pt BIB EMS from [facility] with complaints of right leg redness unknown when it began. Redness extends from ankle to mid thigh. Hot to the touch. fever EXAM: CHEST  1 VIEW COMPARISON:  None. FINDINGS: Sternotomy wires overlie stable cardiac silhouette. Small LEFT effusion is increased. LEFT basilar atelectasis increased. RIGHT lung clear. IMPRESSION: LEFT lower lobe atelectasis and effusion. Cannot exclude LEFT lower lobe pneumonia. Electronically Signed   By: Genevive Bi M.D.   On: 10/11/2018 23:13   Dg Tibia/fibula Right  Result Date: 10/11/2018 CLINICAL DATA:  Pt BIB EMS from [facility] with complaints of right leg redness unknown when it began. Redness extends from ankle to mid thigh. Hot to the touch. redness to entire right leg; unknown if trauma EXAM: RIGHT TIBIA AND FIBULA - 2 VIEW COMPARISON:  None. FINDINGS: No fracture of the tibia or fibula. Knee joint and ankle joint appear normal on two views. IMPRESSION: No fracture or dislocation.  No osseous infection. Electronically Signed   By: Genevive Bi M.D.   On: 10/11/2018 23:17   Dg Knee Complete 4 Views Right  Result Date: 10/11/2018 CLINICAL DATA:  Pt BIB EMS from [facility] with complaints of right leg redness unknown when it began. Redness extends from ankle to mid thigh. Hot to the touch. redness to entire right leg; unknown if trauma EXAM: RIGHT KNEE - COMPLETE 4+ VIEW COMPARISON:  None. FINDINGS: Is No fracture of the proximal tibia or distal femur. Patella is normal. No joint effusion. IMPRESSION: No osseous infection evident.  No effusion Electronically Signed   By: Genevive Bi M.D.   On: 10/11/2018 23:17   EKG Interpretation  Date/Time:  Saturday October 11 2018 22:17:10 EST Ventricular Rate:   80 PR Interval:    QRS Duration: 93 QT Interval:  373 QTC Calculation: 431 R Axis:   124 Text Interpretation:  Sinus rhythm Probable right ventricular hypertrophy Inferior infarct, old No significant change was found Confirmed by Azalia Bilis ([id number]) on 10/12/2018 12:24:29 AM   Pending Labs Unresulted Labs (From admission, onward)    Start     Ordered   10/19/18 0500  Creatinine, serum  (enoxaparin (LOVENOX)    CrCl >/= 30 ml/min)  Weekly,   R    Comments:  while on enoxaparin therapy    10/12/18 0036   10/13/18 0500  Magnesium  Daily,   R     10/12/18 0036   10/12/18 0500  Basic metabolic panel  Daily,   R     10/12/18 0036   10/12/18 0500  CBC with Differential/Platelet  Daily,   R     10/12/18 0036   10/12/18 0035  Brain natriuretic peptide  Add-on,   R     10/12/18 0036   10/12/18 0030  CBC  (enoxaparin (LOVENOX)    CrCl >/= 30 ml/min)  Once,   R    Comments:  Baseline for enoxaparin therapy IF NOT ALREADY DRAWN.  Notify MD if PLT < 100 K.    10/12/18 0036   10/12/18 0030  Creatinine, serum  (enoxaparin (LOVENOX)    CrCl >/= 30 ml/min)  Once,   R    Comments:  Baseline for enoxaparin therapy  IF NOT ALREADY DRAWN.    10/12/18 0036   10/11/18 2144  Urine culture  ONCE - STAT,   STAT     10/11/18 2144   10/11/18 2003  Blood culture (routine x 2)  BLOOD CULTURE X 2,   STAT     10/11/18 2002          Vitals/Pain Today's Vitals   10/11/18 2300 10/11/18 2330 10/11/18 2345 10/12/18 0030  BP: (!) 124/53  (!) 106/52 (!) 103/49  Pulse: 81 87 84 80  Resp:  (!) 30  (!) 32  Temp:      TempSrc:      SpO2: 92% 92% 93% 95%    Isolation Precautions No active isolations  Medications Medications  metroNIDAZOLE (FLAGYL) IVPB 500 mg (0 mg Intravenous Stopped 10/11/18 2345)  chlorhexidine (PERIDEX) 0.12 % solution 15 mL (has no administration in time range)  QUEtiapine (SEROQUEL) tablet 12.5 mg (has no administration in time range)  bumetanide (BUMEX) tablet 0.5 mg (has no  administration in time range)  ceFEPIme (MAXIPIME) 1 g in sodium chloride 0.9 % 100 mL IVPB (has no administration in time range)  enoxaparin (LOVENOX) injection 40 mg (has no administration in time range)  ceFEPIme (MAXIPIME) 2 g in sodium chloride 0.9 % 100 mL IVPB (0 g Intravenous Stopped 10/11/18 2345)  vancomycin (VANCOCIN) IVPB 1000 mg/200 mL premix (1,000 mg Intravenous New Bag/Given 10/11/18 2307)  acetaminophen (TYLENOL) tablet 650 mg (650 mg Oral Given 10/11/18 2308)  0.9 %  sodium chloride infusion ( Intravenous New Bag/Given 10/11/18 2353)    Mobility non-ambulatory

## 2018-10-12 NOTE — H&P (Signed)
History and Physical  [name] NFA:[id number] DOB: [birth year] DOA: 10/11/2018 1946  Referring physician: Janene Madeira [facility] ED) PCP: Kristian Covey, MD   HISTORY   Chief Complaint: R leg rash  HPI: [name] is a 84 y.o. female with CAD s/p CABG, subclavian arterial stenosis, hx of PE, prior warfarin use (discontinued due to fall and hematoma), HTN, dementia, who presented from [facility] nursing facility after acute R lower extremity rash and swelling. History mostly obtained from ED who communicated with [facility] staff. Patient reportedly had a mechanical fall earlier in the week without clear injury. Per staff unclear when rash began, although first noted on evening of admission. Patient herself denies any chest pain, fevers, cough, or other symptoms -- limited history due to dementia. On arrival to [facility] ED, temperature 100.5   Review of Systems:  + R lower extremity rash and swelling + fever on arrival Rest of review limited by patient's dementia.  - no cough; no chest pain, dyspnea on exertion - no nausea/vomiting; no tarry, melanotic or bloody stools - no dysuria, increased urinary frequency - no weight changes  ED course:  Vitals Blood pressure (!) 103/49, pulse 80, temperature (!) 100.5 F (38.1 C), temperature source Oral, resp. rate (!) 32, SpO2 95 %. Received vancomycin 1gm IV x 1; cefepime 2gm IV x 1; flagyl 500mg  IV x1; tylenol 650mg  x 1.   Past Medical History:  Diagnosis Date  . Anxiety   . Aspirin intolerance   . Bronchitis, mucopurulent recurrent (HCC)   . CAD (coronary artery disease)    2008, question slight anteroapical ischemia  . Carotid artery disease (HCC)    Doppler, December, 2007, bilateral 0-39%  . Dementia (HCC)    Senile dementia with delusional features  . Diabetes mellitus   . DJD (degenerative joint disease)   . Ejection fraction    EF 70%, nuclear, 2007  . Esophageal stricture   . GERD (gastroesophageal reflux  disease)   . Hematoma    Large hematoma from fall, November, 2009, Coumadin stopped  . Hemorrhoids   . Hiatal hernia   . Hx of CABG    2003, ... LIMA not used... small caliber  . Hyperlipidemia   . Hypertension   . IBS (irritable bowel syndrome)   . Nephrolithiasis   . Obesity   . Osteoporosis   . Palpitations   . Pulmonary embolism (HCC)   . Right knee injury   . Senile dementia with delusional features (HCC)   . SOB (shortness of breath)   . Subclavian arterial stenosis (HCC)    PCI  . Warfarin anticoagulation    stopped 09/2008 with large hematoma from fall   Past Surgical History:  Procedure Laterality Date  . ABDOMINAL HYSTERECTOMY    . APPENDECTOMY    . cabg  1 vessel w/SVG to LAD  2003  . CATARACT EXTRACTION    . FRACTURE SURGERY    . HEMORRHOID SURGERY    . mult orthopedic procedures     Dr. Arnette Norris arm, CTS,cyst removal  . open heart surgery      Social History:  reports that she has never smoked. She has never used smokeless tobacco. She reports that she does not drink alcohol or use drugs.  Allergies  Allergen Reactions  . Aspirin     REACTION: pt states GI INTOL to aspirin  . Iodine     REACTION: GI UPSET  . Irbesartan-Hydrochlorothiazide     REACTION:  GI UPSET  . Penicillins     REACTION: GI UPSET Has patient had a PCN reaction causing immediate rash, facial/tongue/throat swelling, SOB or lightheadedness with hypotension: No Has patient had a PCN reaction causing severe rash involving mucus membranes or skin necrosis: No Has patient had a PCN reaction that required hospitalization: No Has patient had a PCN reaction occurring within the last 10 years: No If all of the above answers are "NO", then may proceed with Cephalosporin use.    Family History  Problem Relation Age of Onset  . Stroke Father   . Cancer Mother        bladder cancer  . Hypertension Mother   . Stroke Mother   . Leukemia Sister   . Colon cancer Neg Hx       Prior to  Admission medications   Medication Sig Start Date End Date Taking? Authorizing Provider  aluminum-magnesium hydroxide-simethicone (MAALOX) 200-200-20 MG/5ML SUSP Take 30 mLs by mouth every 6 (six) hours as needed (heartburn or indigestion).   Yes [provider]  atorvastatin (LIPITOR) 20 MG tablet Take 20 mg by mouth every evening.    Yes [provider]  bumetanide (BUMEX) 0.5 MG tablet Take 0.5 mg by mouth daily.   Yes [provider]  Calcium 600-200 MG-UNIT tablet Take 1 tablet by mouth 2 (two) times daily.   Yes [provider]  chlorhexidine (PERIDEX) 0.12 % solution Use as directed 15 mLs in the mouth or throat daily.    Yes [provider]  clopidogrel (PLAVIX) 75 MG tablet TAKE 1 TABLET BY MOUTH EVERY DAY 10/16/11  Yes Luis Abed, MD  Dextromethorphan-guaiFENesin 5-100 MG/5ML LIQD Take 5 mLs by mouth every 6 (six) hours as needed (cough).   Yes [provider]  famotidine (PEPCID) 20 MG tablet Take 20 mg by mouth daily.   Yes [provider]  glimepiride (AMARYL) 1 MG tablet Take 1/2 tablet by mouth once daily Patient taking differently: Take 1 mg by mouth daily with breakfast.  06/18/11  Yes Michele Mcalpine, MD  guaifenesin (ROBITUSSIN) 100 MG/5ML syrup Take 200 mg by mouth 4 (four) times daily as needed for cough.   Yes [provider]  loperamide (IMODIUM) 2 MG capsule Take 2 mg by mouth daily as needed for diarrhea or loose stools.   Yes [provider]  losartan (COZAAR) 50 MG tablet Take 50 mg by mouth every evening.    Yes [provider]  magnesium hydroxide (MILK OF MAGNESIA) 400 MG/5ML suspension Take 30 mLs by mouth daily as needed for mild constipation.   Yes [provider]  metFORMIN (GLUCOPHAGE) 1000 MG tablet Take 1,000 mg by mouth 2 (two) times daily with a meal.   Yes [provider]  metoprolol succinate (TOPROL-XL) 25 MG 24 hr tablet Take 25 mg by mouth daily.    Yes [provider]  QUEtiapine (SEROQUEL) 25 MG tablet Take 0.5 tablets (12.5 mg total) by mouth at bedtime. 12/25/12  Yes Michele Mcalpine, MD  vitamin B-12 (CYANOCOBALAMIN) 1000 MCG tablet Take 1,000 mcg by mouth daily.   Yes [provider]  acetaminophen (TYLENOL) 325 MG tablet Take 650 mg by mouth every 6 (six) hours as needed for mild pain.    [provider]  bumetanide (BUMEX) 0.5 MG tablet Take 1 tablet (0.5 mg total) by mouth daily. Patient not taking: Reported on 10/11/2018 02/17/13   Michele Mcalpine, MD  ibandronate (BONIVA) 150 MG tablet  Take 1 tablet (150 mg total) by mouth every 30 (thirty) days. Take in the morning with a full glass of water, on an empty stomach, and do not take anything else by mouth or lie down for the next 30 min. 06/18/11   Michele McalpineNadel, Scott M, MD    PHYSICAL EXAM   Temp:  [100.5 F (38.1 C)] 100.5 F (38.1 C) (12/07 1950) Pulse Rate:  [80-87] 80 (12/08 0030) Resp:  [18-37] 32 (12/08 0030) BP: (103-135)/(49-103) 103/49 (12/08 0030) SpO2:  [92 %-95 %] 95 % (12/08 0030)  BP (!) 103/49   Pulse 80   Temp (!) 100.5 F (38.1 C) (Oral)   Resp (!) 32   SpO2 95%    GEN obese elderly caucasian female; resting comfortably in bed  HEENT NCAT EOM intact PERRL; clear oropharynx, no cervical LAD; dry mucus membranes  JVP estimated 5 cm H2O above RA; no HJR ; no carotid bruits b/l ;  CV regular normal rate; normal S1 and S2; 2/6 LUSB systolic murmur; no S3/S4; PMI non displaced; no parasternal heave  RESP diminished over L mid lung; coarse; no crackles; R lung clear; breathing labored + accessory mm use; ABD soft NT ND +normoactive BS  EXT warm throughout b/l; non pitting edema over R leg with rash (see below) PULSES  DP and radials 2+ intact b/l  SKIN/MSK  Red dry flat rash over entire shin with petechiae and more pronounced erythema over medial lower thigh and leg, warm to touch, mild edema; rash sparing the lateral knee (see picture from ED  provider Gibbons at 10/11/18 at 21:51) NEURO/PSYCH AAOx1 (name); no focal deficits   DATA   LABS ON ADMISSION:  Basic Metabolic Panel: Recent Labs  Lab 10/11/18 2035  NA 142  K 3.7  CL 108  CO2 24  GLUCOSE 218*  BUN 23  CREATININE 1.03*  CALCIUM 8.7*   CBC: Recent Labs  Lab 10/11/18 2035  WBC 16.4*  NEUTROABS 14.9*  HGB 12.1  HCT 37.8  MCV 96.4  PLT 148*   Liver Function Tests: No results for input(s): AST, ALT, ALKPHOS, BILITOT, PROT, ALBUMIN in the last 168 hours. No results for input(s): LIPASE, AMYLASE in the last 168 hours. No results for input(s): AMMONIA in the last 168 hours. Coagulation:  Lab Results  Component Value Date   INR 0.98 03/12/2018   INR 0.9 RATIO 01/12/2009   INR 1.2 10/05/2008   No results found for: PTT Lactic Acid, Venous:     Component Value Date/Time   LATICACIDVEN 1.37 10/11/2018 2045   Cardiac Enzymes: No results for input(s): CKTOTAL, CKMB, CKMBINDEX, TROPONINI in the last 168 hours. Urinalysis:    Component Value Date/Time   COLORURINE YELLOW 10/11/2018 2213   APPEARANCEUR CLEAR 10/11/2018 2213   LABSPEC 1.027 10/11/2018 2213   PHURINE 6.0 10/11/2018 2213   GLUCOSEU NEGATIVE 10/11/2018 2213   GLUCOSEU NEGATIVE 01/16/2010 1636   HGBUR NEGATIVE 10/11/2018 2213   BILIRUBINUR NEGATIVE 10/11/2018 2213   KETONESUR 20 (A) 10/11/2018 2213   PROTEINUR 30 (A) 10/11/2018 2213   UROBILINOGEN 0.2 10/27/2011 1102   NITRITE NEGATIVE 10/11/2018 2213   LEUKOCYTESUR NEGATIVE 10/11/2018 2213    BNP (last 3 results) No results for input(s): PROBNP in the last 8760 hours. CBG: No results for input(s): GLUCAP in the last 168 hours.  Radiological Exams on Admission: Dg Chest 1 View  Result Date: 10/11/2018 CLINICAL DATA:  Pt BIB EMS from [facility] with complaints of right leg redness unknown when  it began. Redness extends from ankle to mid thigh. Hot to the touch. fever EXAM: CHEST  1 VIEW COMPARISON:  None. FINDINGS: Sternotomy  wires overlie stable cardiac silhouette. Small LEFT effusion is increased. LEFT basilar atelectasis increased. RIGHT lung clear. IMPRESSION: LEFT lower lobe atelectasis and effusion. Cannot exclude LEFT lower lobe pneumonia. Electronically Signed   By: Genevive Bi M.D.   On: 10/11/2018 23:13   Dg Tibia/fibula Right  Result Date: 10/11/2018 CLINICAL DATA:  Pt BIB EMS from [facility] with complaints of right leg redness unknown when it began. Redness extends from ankle to mid thigh. Hot to the touch. redness to entire right leg; unknown if trauma EXAM: RIGHT TIBIA AND FIBULA - 2 VIEW COMPARISON:  None. FINDINGS: No fracture of the tibia or fibula. Knee joint and ankle joint appear normal on two views. IMPRESSION: No fracture or dislocation.  No osseous infection. Electronically Signed   By: Genevive Bi M.D.   On: 10/11/2018 23:17   Dg Knee Complete 4 Views Right  Result Date: 10/11/2018 CLINICAL DATA:  Pt BIB EMS from [facility] with complaints of right leg redness unknown when it began. Redness extends from ankle to mid thigh. Hot to the touch. redness to entire right leg; unknown if trauma EXAM: RIGHT KNEE - COMPLETE 4+ VIEW COMPARISON:  None. FINDINGS: Is No fracture of the proximal tibia or distal femur. Patella is normal. No joint effusion. IMPRESSION: No osseous infection evident.  No effusion Electronically Signed   By: Genevive Bi M.D.   On: 10/11/2018 23:17    EKG: Independently reviewed. NSR, R axis deviation   ASSESSMENT AND PLAN   Assessment: [name] is a 84 y.o. female with CAD s/p CABG, subclavian arterial stenosis, hx of PE, prior warfarin use (discontinued due to fall and hematoma), HTN, dementia, who presented with a new R lower extremity rash and fever of 100.5 from [facility] nursing facility. The appearance of the rash is not typical for cellulitis and actually resembles phlebitis. Tachypnea on exam, borderline sats decrease (although still in  92-95% on RA), and BP 100-130s/40-60s. Not tachycardic. However, given hx of PE and borderline clinical sx, will treat for possible PE and continue treating for cellulitis with empiric abx. Also complicated by possible appearance of effusion in left lower lung on CXR.    Active Problems:   Rash   Plan:   # R lower extremity rash: cellulitis vs phlebitis - continue vanc + cefepime - blood cultures pending - LE duplex ordered - PE workup and empiric treatment as below - tylenol 650mg  q6h prn fever  # Concern for PE given prior hx and leg rash - CTA chest ordered - heparin drip started - small bolus prn if systolics < 100 - telemetry  # Hx of DMT2 - holding home glimepiride given NPO  # L lower lung effusion vs atelectasis > per chart review, TTE in 2007 showed normal EF - resuming home bumex 0.5mg  daily - holding off on IV diuresis given concerns for PE and infection - BNP pending - consider TTE if either BNP elevated or if CTA shows PE  # Hx of CAD s/p CABG  > note aspirin intolerance - holding plavix at this time, can resume tomorrow (although unclear utility given age) - holding atorvastatin overnight, can resume tomorrow  # HTN  - holding home losartan given low BP  # Oral hygiene/dry mouth  - resume home chlorhexidine solution  # Hx of dementia   -  resume home seroquel 12.5mg  nightly    DVT Prophylaxis: heparin drip Code Status:  Full Code Family Communication: patient; could not reach family overnight Disposition Plan: admit to inpatient (telemetry) for cellulitis and PE workup [facility] resident)  Patient contact: Extended Emergency Contact Information Primary Emergency Contact: Proffitt,Clara Address: [address]          [place], [place] [address] [place] Home Phone: [phone] Relation: Niece  Time spent: > 35 minutes  Ike Bene, MD [facility] Pager [phone]  If 7PM-7AM, please contact  night-coverage [email or web address] Password [email or web address] 10/12/2018, 1:18 AM

## 2018-10-12 NOTE — Progress Notes (Signed)
Still unable to complete admission due to patient confusion and no family available.

## 2018-10-12 NOTE — Progress Notes (Signed)
Pt. Pulled out IV, as well as attempting to get out of bed multiple times. Fluids restarted in LAC and wrapped in kerlex. Mitts placed on patient and floor mats placed. Telesitter ordered.

## 2018-10-12 NOTE — Progress Notes (Addendum)
ANTICOAGULATION CONSULT NOTE - Initial Consult  Pharmacy Consult for Heparin Indication: R/O Pulmonary Embolism  Allergies  Allergen Reactions  . Aspirin     REACTION: pt states GI INTOL to aspirin  . Iodine     REACTION: GI UPSET  . Irbesartan-Hydrochlorothiazide     REACTION: GI UPSET  . Penicillins     REACTION: GI UPSET Has patient had a PCN reaction causing immediate rash, facial/tongue/throat swelling, SOB or lightheadedness with hypotension: No Has patient had a PCN reaction causing severe rash involving mucus membranes or skin necrosis: No Has patient had a PCN reaction that required hospitalization: No Has patient had a PCN reaction occurring within the last 10 years: No If all of the above answers are "NO", then may proceed with Cephalosporin use.    Patient Measurements: Height: 5\' 8"  (172.7 cm) IBW/kg (Calculated) : 63.9  Weight reported by RN: 58.2 kg Heparin Dosing Weight: actual body weight  Vital Signs: Temp: 100.5 F (38.1 C) (12/07 1950) Temp Source: Oral (12/07 1950) BP: 119/50 (12/08 0115) Pulse Rate: 82 (12/08 0115)  Labs: Recent Labs    10/11/18 2035  HGB 12.1  HCT 37.8  PLT 148*  CREATININE 1.03*    CrCl cannot be calculated (Unknown ideal weight.).   Medical History: Past Medical History:  Diagnosis Date  . Anxiety   . Aspirin intolerance   . Bronchitis, mucopurulent recurrent (HCC)   . CAD (coronary artery disease)    2008, question slight anteroapical ischemia  . Carotid artery disease (HCC)    Doppler, December, 2007, bilateral 0-39%  . Dementia (HCC)    Senile dementia with delusional features  . Diabetes mellitus   . DJD (degenerative joint disease)   . Ejection fraction    EF 70%, nuclear, 2007  . Esophageal stricture   . GERD (gastroesophageal reflux disease)   . Hematoma    Large hematoma from fall, November, 2009, Coumadin stopped  . Hemorrhoids   . Hiatal hernia   . Hx of CABG    2003, ... LIMA not used... small  caliber  . Hyperlipidemia   . Hypertension   . IBS (irritable bowel syndrome)   . Nephrolithiasis   . Obesity   . Osteoporosis   . Palpitations   . Pulmonary embolism (HCC)   . Right knee injury   . Senile dementia with delusional features (HCC)   . SOB (shortness of breath)   . Subclavian arterial stenosis (HCC)    PCI  . Warfarin anticoagulation    stopped 09/2008 with large hematoma from fall    Medications:  No oral anticoagulation PTA  Assessment:  [age] yr female brought to ED due to right leg rash  PMH signficant for CAD s/p CABG, h/o PE (on warfarin previously which was d/c'ed in 2009 due to hematoma s/p fall).  Concern for PE due to history and presenting with right lower extremity swelling.  CTAngio ordered.  Dopplers ordered.  Pharmacy consulted to initiate IV heparin for r/o PE  Goal of Therapy:  Heparin level 0.3-0.7 units/ml Monitor platelets by anticoagulation protocol: Yes   Plan:   Obtain baseline aPTT and PT/INR per protocol  Heparin 2000 unit IV bolus x 1 followed by heparin infusion @ 950 units/hr  Check heparin level 8 hr after heparin started  Follow heparin level and CBC daily while on heparin  Melisia Leming, Joselyn GlassmanLeann Trefz, PharmD 10/12/2018,2:07 AM

## 2018-10-13 ENCOUNTER — Encounter (HOSPITAL_COMMUNITY): Payer: Medicare Other

## 2018-10-13 DIAGNOSIS — A419 Sepsis, unspecified organism: Secondary | ICD-10-CM

## 2018-10-13 DIAGNOSIS — L03115 Cellulitis of right lower limb: Secondary | ICD-10-CM

## 2018-10-13 LAB — CBC WITH DIFFERENTIAL/PLATELET
Abs Immature Granulocytes: 0.05 10*3/uL (ref 0.00–0.07)
BASOS ABS: 0 10*3/uL (ref 0.0–0.1)
Basophils Relative: 0 %
Eosinophils Absolute: 0.3 10*3/uL (ref 0.0–0.5)
Eosinophils Relative: 2 %
HCT: 34.3 % — ABNORMAL LOW (ref 36.0–46.0)
Hemoglobin: 11.1 g/dL — ABNORMAL LOW (ref 12.0–15.0)
Immature Granulocytes: 0 %
Lymphocytes Relative: 8 %
Lymphs Abs: 1 10*3/uL (ref 0.7–4.0)
MCH: 30.9 pg (ref 26.0–34.0)
MCHC: 32.4 g/dL (ref 30.0–36.0)
MCV: 95.5 fL (ref 80.0–100.0)
MONOS PCT: 6 %
Monocytes Absolute: 0.7 10*3/uL (ref 0.1–1.0)
NEUTROS PCT: 84 %
NRBC: 0 % (ref 0.0–0.2)
Neutro Abs: 9.8 10*3/uL — ABNORMAL HIGH (ref 1.7–7.7)
Platelets: 104 10*3/uL — ABNORMAL LOW (ref 150–400)
RBC: 3.59 MIL/uL — ABNORMAL LOW (ref 3.87–5.11)
RDW: 13.3 % (ref 11.5–15.5)
WBC: 11.8 10*3/uL — ABNORMAL HIGH (ref 4.0–10.5)

## 2018-10-13 LAB — BASIC METABOLIC PANEL
Anion gap: 7 (ref 5–15)
BUN: 18 mg/dL (ref 8–23)
CO2: 20 mmol/L — AB (ref 22–32)
Calcium: 7.7 mg/dL — ABNORMAL LOW (ref 8.9–10.3)
Chloride: 117 mmol/L — ABNORMAL HIGH (ref 98–111)
Creatinine, Ser: 0.69 mg/dL (ref 0.44–1.00)
GFR calc Af Amer: >60 mL/min (ref >60)
GFR calc non Af Amer: >60 mL/min (ref >60)
GLUCOSE: 123 mg/dL — AB (ref 70–99)
Potassium: 3.5 mmol/L (ref 3.5–5.1)
Sodium: 144 mmol/L (ref 135–145)

## 2018-10-13 LAB — GLUCOSE, CAPILLARY
Glucose-Capillary: 116 mg/dL — ABNORMAL HIGH (ref 70–99)
Glucose-Capillary: 284 mg/dL — ABNORMAL HIGH (ref 70–99)
Glucose-Capillary: 116 mg/dL — ABNORMAL HIGH (ref 70–99)
Glucose-Capillary: 55 mg/dL — ABNORMAL LOW (ref 70–99)
Glucose-Capillary: 101 mg/dL — ABNORMAL HIGH (ref 70–99)

## 2018-10-13 LAB — HEMOGLOBIN A1C
Hgb A1c MFr Bld: 5.7 % — ABNORMAL HIGH (ref 4.8–5.6)
Mean Plasma Glucose: 116.89 mg/dL

## 2018-10-13 LAB — URINE CULTURE: Culture: NO GROWTH

## 2018-10-13 MED ORDER — ENOXAPARIN SODIUM 40 MG/0.4ML ~~LOC~~ SOLN
40.0000 mg | SUBCUTANEOUS | Status: DC
  Administered 2018-10-13: 40 mg via SUBCUTANEOUS
  Filled 2018-10-13: qty 0.4

## 2018-10-13 NOTE — Care Management Note (Signed)
Important Message  Patient Details  Name: [name] MRN: [id number] Date of Birth: 15-Oct-1929   Medicare Important Message Given:    Benefit Check Lovenox 100 mg not on formulary Enoxaparin is and it is $15.00, Xarelto  Is $26.40 for 15 mg 2x day, Eliquis is $26.40  For 10 mg 2x day. Nor prior auth required  [name], [name] 10/13/2018, 11:41 AM

## 2018-10-13 NOTE — Progress Notes (Addendum)
PROGRESS NOTE  [name] ZOX:[id number] DOB: Mar 11, 1929 DOA: 10/11/2018 PCP: Kristian CoveyBurchette, Bruce W, MD  HPI/Recap of past 24 hours: HPI from Dr Rocco SerenePark [name] is a 84 y.o. female with CAD s/p CABG, subclavian arterial stenosis, hx of PE, prior warfarin use (discontinued due to fall and hematoma), HTN, dementia, who presented from [facility] after acute R lower extremity rash and swelling. History mostly obtained from ED who communicated with [facility] staff. Patient reportedly had a mechanical fall earlier in the week without clear injury. Per staff unclear when rash began, although first noted on evening of admission. Patient herself denies any chest pain, fevers, cough, or other symptoms -- limited history due to dementia. [facility], temperature 100.5 with leukocytosis. Pt admitted for further management.   Today, pt denies any new complaints.  History of dementia  Assessment/Plan: Active Problems:   Rash   Leg swelling  Sepsis 2/2 ?RLE cellulitis Febrile, with leukocytosis on admission Currently afebrile, with resolving leukocytosis RLE cellulitis, with no significant improvement since admission Lactic acid 1.37 BC x2 NGTD X-ray right lower extremity no fracture or dislocation, no osseous infx Venous Doppler pending S/P IV fluids Continue IV vancomycin, cefepime, Flagyl, plan to de-escalate Consider consulting ID if no significant improvement within the next day or so  Hypokalemia Replace PRN  Chronic diastolic HF BNP 287, above from baseline Chest x-ray showed left lower lobe atelectasis, cannot exclude pneumonia CTA chest negative for PE, no other acute abnormality Echo done 10/2018 showed EF of 60 to 65%, grade 1 diastolic dysfunction Will hold home Bumex, losartan, Toprol due to soft BP  History of CAD, s/p CABG Denies any chest pain Continue Lipitor, Plavix  Diabetes mellitus type 2 A1c 5.7 SSI, Accu-Checks,  hypoglycemic protocol Hold home glimepiride, metformin  Hypertension BP soft Continue to hold Bumex, losartan, Toprol  History of dementia Continue Seroquel          Malnutrition Type:      Malnutrition Characteristics:      Nutrition Interventions:       Estimated body mass index is 22.73 kg/m as calculated from the following:   Height as of this encounter: 5\' 3"  (1.6 m).   Weight as of this encounter: 58.2 kg.     Code Status: Full  Family Communication: None at bedside  Disposition Plan: Back to SNF   Consultants:  None  Procedures:  None  Antimicrobials:  Vancomycin  Cefepime  Flagyl  DVT prophylaxis: Lovenox   Objective: Vitals:   10/12/18 1316 10/12/18 2011 10/13/18 0416 10/13/18 1522  BP: 92/74 (!) 110/47 (!) 120/53 (!) 109/54  Pulse: 72 67 72 64  Resp: 20 16 16 16   Temp: 98 F (36.7 C) 100.2 F (37.9 C) 97.6 F (36.4 C) 98.4 F (36.9 C)  TempSrc: Oral Oral Oral Oral  SpO2: 95% 94% 94% 97%  Weight:      Height:        Intake/Output Summary (Last 24 hours) at 10/13/2018 1640 Last data filed at 10/13/2018 1106 Gross per 24 hour  Intake 1780.6 ml  Output 350 ml  Net 1430.6 ml   Filed Weights   10/12/18 0207  Weight: 58.2 kg    Exam:  General: NAD, awake  Cardiovascular: S1, S2 present  Respiratory: CTAB  Abdomen: Soft, nontender, nondistended, bowel sounds present  Musculoskeletal: Right lower extremity erythema up to mid thigh, warmth, edema, lower left extremity appears stable  Skin:  As above  Psychiatry:  Appears to have normal mood   Data Reviewed: CBC: Recent Labs  Lab 10/11/18 2035 10/12/18 0224 10/13/18 0505  WBC 16.4* 15.6* 11.8*  NEUTROABS 14.9* 13.7* 9.8*  HGB 12.1 11.2* 11.1*  HCT 37.8 35.2* 34.3*  MCV 96.4 99.4 95.5  PLT 148* 129* 104*   Basic Metabolic Panel: Recent Labs  Lab 10/11/18 2035 10/12/18 0224 10/13/18 0505  NA 142 143 144  K 3.7 3.1* 3.5  CL 108 110 117*    CO2 24 24 20*  GLUCOSE 218* 213* 123*  BUN 23 28* 18  CREATININE 1.03* 1.00 0.69  CALCIUM 8.7* 8.4* 7.7*   GFR: Estimated Creatinine Clearance: 41 mL/min (by C-G formula based on SCr of 0.69 mg/dL). Liver Function Tests: No results for input(s): AST, ALT, ALKPHOS, BILITOT, PROT, ALBUMIN in the last 168 hours. No results for input(s): LIPASE, AMYLASE in the last 168 hours. No results for input(s): AMMONIA in the last 168 hours. Coagulation Profile: Recent Labs  Lab 10/12/18 0224  INR 1.08   Cardiac Enzymes: No results for input(s): CKTOTAL, CKMB, CKMBINDEX, TROPONINI in the last 168 hours. BNP (last 3 results) No results for input(s): PROBNP in the last 8760 hours. HbA1C: Recent Labs    10/13/18 0505  HGBA1C 5.7*   CBG: Recent Labs  Lab 10/12/18 1628 10/12/18 2008 10/13/18 0813 10/13/18 1105 10/13/18 1631  GLUCAP 140* 150* 116* 284* 55*   Lipid Profile: No results for input(s): CHOL, HDL, LDLCALC, TRIG, CHOLHDL, LDLDIRECT in the last 72 hours. Thyroid Function Tests: No results for input(s): TSH, T4TOTAL, FREET4, T3FREE, THYROIDAB in the last 72 hours. Anemia Panel: No results for input(s): VITAMINB12, FOLATE, FERRITIN, TIBC, IRON, RETICCTPCT in the last 72 hours. Urine analysis:    Component Value Date/Time   COLORURINE YELLOW 10/11/2018 2213   APPEARANCEUR CLEAR 10/11/2018 2213   LABSPEC 1.027 10/11/2018 2213   PHURINE 6.0 10/11/2018 2213   GLUCOSEU NEGATIVE 10/11/2018 2213   GLUCOSEU NEGATIVE 01/16/2010 1636   HGBUR NEGATIVE 10/11/2018 2213   BILIRUBINUR NEGATIVE 10/11/2018 2213   KETONESUR 20 (A) 10/11/2018 2213   PROTEINUR 30 (A) 10/11/2018 2213   UROBILINOGEN 0.2 10/27/2011 1102   NITRITE NEGATIVE 10/11/2018 2213   LEUKOCYTESUR NEGATIVE 10/11/2018 2213   Sepsis Labs: @LABRCNTIP (procalcitonin:4,lacticidven:4)  ) Recent Results (from the past 240 hour(s))  Blood culture (routine x 2)     Status: None (Preliminary result)   Collection Time:  10/11/18  8:35 PM  Result Value Ref Range Status   Specimen Description   Final    BLOOD RIGHT HAND Performed at [facility], [address], [place], [place] [address]    Special Requests   Final    BOTTLES DRAWN AEROBIC AND ANAEROBIC Blood Culture adequate volume Performed at [facility], [address], [place], [place] [address]    Culture   Final    NO GROWTH 1 DAY Performed at [facility], [address] [place], [place] [address]    Report Status PENDING  Incomplete  Blood culture (routine x 2)     Status: None (Preliminary result)   Collection Time: 10/11/18  9:20 PM  Result Value Ref Range Status   Specimen Description   Final    BLOOD LEFT ARM Performed at [facility], [address] [place], [place] [address]    Special Requests   Final    BOTTLES DRAWN AEROBIC AND ANAEROBIC Blood Culture adequate volume Performed at [facility],  [address] [place], [place] [address]    Culture   Final    NO GROWTH 1 DAY Performed at [facility], [address], [place], [place] [address]    Report Status PENDING  Incomplete  Urine culture     Status: None   Collection Time: 10/11/18 10:13 PM  Result Value Ref Range Status   Specimen Description   Final    URINE, RANDOM Performed at [facility], [address] [place], [place] [address]    Special Requests   Final    NONE Performed at [facility], [address], [place], [place] [address]    Culture   Final    NO GROWTH Performed at [facility], [address], [place], [place] [address]    Report Status 10/13/2018 FINAL  Final  MRSA PCR Screening     Status: None   Collection Time: 10/12/18  3:27 AM  Result Value Ref Range Status   MRSA by PCR NEGATIVE NEGATIVE Final    Comment:        The GeneXpert MRSA Assay (FDA approved for NASAL specimens only), is one component of  a comprehensive MRSA colonization surveillance program. It is not intended to diagnose MRSA infection nor to guide or monitor treatment for MRSA infections. Performed at [facility], [address], [place], [place] [address]       Studies: No results found.  Scheduled Meds: . atorvastatin  20 mg Oral QPM  . chlorhexidine  15 mL Mouth/Throat Daily  . clopidogrel  75 mg Oral Daily  . enoxaparin (LOVENOX) injection  40 mg Subcutaneous Q24H  . famotidine  20 mg Oral Daily  . insulin aspart  0-9 Units Subcutaneous TID WC  . QUEtiapine  12.5 mg Oral QHS    Continuous Infusions: . sodium chloride 10 mL/hr at 10/13/18 0856  . ceFEPime (MAXIPIME) IV 1 g (10/13/18 0858)  . metronidazole 500 mg (10/13/18 1416)  . vancomycin Stopped (10/12/18 2354)     LOS: 1 day     Briant Cedar, MD [facility]  If 7PM-7AM, please contact night-coverage [email or web address] 10/13/2018, 4:40 PM

## 2018-10-13 NOTE — Clinical Social Work Note (Signed)
Clinical Social Work Assessment  Patient Details  Name: [name] MRN: [id number] Date of Birth: 06-Oct-1929  Date of referral:  10/13/18               Reason for consult:  (resident of facility)                Permission sought to share information with:    Permission granted to share information::     Name::        Agency::     Relationship::     Contact Information:     Housing/Transportation Living arrangements for the past 2 months:  Assisted Living Facility Source of Information:  Medical Team, Facility Patient Interpreter Needed:  None Criminal Activity/Legal Involvement Pertinent to Current Situation/Hospitalization:  No - Comment as needed Significant Relationships:  Merchandiser, retailCommunity Support Lives with:  Facility Resident Do you feel safe going back to the place where you live?  [name] Need for family participation in patient care:  [name]  Care giving concerns:  Pt admitted from assisted living facility. Per chart review pt is ambulatory at baseline, uta with what level of assistance.  Admitted for sepsis, secondary to cellulitis. Has hx of dementia per chart and mental status consistent with that.   Social Worker assessment / plan:  CSW consulted to assist with disposition as pt admitted from [facility].  Unable to have meaningful assessment with pt as she is only oriented to self. Spoke with reception at facility who confirms pt is resident there, however care staff unavailable at time of call for CSW to gather more details. Will contact ALF again later (unsure if [facility] is in AL or [facility]). Will assist with coordinating care at DC, anticipate return to [facility].   Employment status:  Retired Theme park managernsurance information:  Managed Medicare(UHC Medicare) PT Recommendations:    Information / Referral to community resources:     Patient/Family's Response to care:  UTA  Patient/Family's Understanding of and Emotional Response to Diagnosis, Current Treatment, and  Prognosis:  UTA  Emotional Assessment Appearance:    Attitude/Demeanor/Rapport:    Affect (typically observed):    Orientation:  Oriented to Self Alcohol / Substance use:  Not Applicable Psych involvement (Current and /or in the community):  No (Comment)  Discharge Needs  Concerns to be addressed:  Care Coordination Readmission within the last 30 days:  No Current discharge risk:  None Barriers to Discharge:  Continued Medical Work up   Terex CorporationMeghan R Ioana Louks, LCSW 10/13/2018, 1:20 PM [phone]

## 2018-10-13 NOTE — Progress Notes (Signed)
Took over care of patient. Patient currently stable but continuing to pull at lines. Will continue to monitor. Tele sitter in place.

## 2018-10-13 NOTE — Progress Notes (Signed)
Pharmacy Brief Note:  Order for enoxaparin 30 mg subcutaneously daily increased to enoxaparin 40 mg subcutaneously daily for DVT ppx for CrCl > 30 mL/min. Per protocol.  Cindi CarbonMary M Art Levan, PharmD 10/13/18 10:07 AM

## 2018-10-13 NOTE — Progress Notes (Signed)
Lovenox co pay $15.00,  Xarelto and Eliquis co-pay $26.40.

## 2018-10-14 ENCOUNTER — Inpatient Hospital Stay (HOSPITAL_COMMUNITY): Payer: Medicare Other

## 2018-10-14 DIAGNOSIS — L538 Other specified erythematous conditions: Secondary | ICD-10-CM

## 2018-10-14 DIAGNOSIS — R609 Edema, unspecified: Secondary | ICD-10-CM

## 2018-10-14 DIAGNOSIS — M7989 Other specified soft tissue disorders: Secondary | ICD-10-CM

## 2018-10-14 LAB — GLUCOSE, CAPILLARY
GLUCOSE-CAPILLARY: 141 mg/dL — AB (ref 70–99)
GLUCOSE-CAPILLARY: 108 mg/dL — AB (ref 70–99)
Glucose-Capillary: 93 mg/dL (ref 70–99)
Glucose-Capillary: 120 mg/dL — ABNORMAL HIGH (ref 70–99)

## 2018-10-14 LAB — CBC WITH DIFFERENTIAL/PLATELET
Abs Immature Granulocytes: 0.04 10*3/uL (ref 0.00–0.07)
BASOS ABS: 0 10*3/uL (ref 0.0–0.1)
Basophils Relative: 1 %
Eosinophils Absolute: 0.6 10*3/uL — ABNORMAL HIGH (ref 0.0–0.5)
Eosinophils Relative: 6 %
HCT: 33.8 % — ABNORMAL LOW (ref 36.0–46.0)
Hemoglobin: 10.4 g/dL — ABNORMAL LOW (ref 12.0–15.0)
IMMATURE GRANULOCYTES: 1 %
LYMPHS ABS: 1.2 10*3/uL (ref 0.7–4.0)
Lymphocytes Relative: 14 %
MCH: 30.4 pg (ref 26.0–34.0)
MCHC: 30.8 g/dL (ref 30.0–36.0)
MCV: 98.8 fL (ref 80.0–100.0)
Monocytes Absolute: 0.6 10*3/uL (ref 0.1–1.0)
Monocytes Relative: 6 %
NRBC: 0 % (ref 0.0–0.2)
Neutro Abs: 6.4 10*3/uL (ref 1.7–7.7)
Neutrophils Relative %: 72 %
Platelets: 122 10*3/uL — ABNORMAL LOW (ref 150–400)
RBC: 3.42 MIL/uL — ABNORMAL LOW (ref 3.87–5.11)
RDW: 13.5 % (ref 11.5–15.5)
WBC: 8.8 10*3/uL (ref 4.0–10.5)

## 2018-10-14 LAB — BASIC METABOLIC PANEL
Anion gap: 5 (ref 5–15)
BUN: 14 mg/dL (ref 8–23)
CO2: 22 mmol/L (ref 22–32)
Calcium: 7.5 mg/dL — ABNORMAL LOW (ref 8.9–10.3)
Chloride: 114 mmol/L — ABNORMAL HIGH (ref 98–111)
Creatinine, Ser: 0.64 mg/dL (ref 0.44–1.00)
GFR calc Af Amer: >60 mL/min (ref >60)
GFR calc non Af Amer: >60 mL/min (ref >60)
Glucose, Bld: 110 mg/dL — ABNORMAL HIGH (ref 70–99)
Potassium: 3.5 mmol/L (ref 3.5–5.1)
Sodium: 141 mmol/L (ref 135–145)

## 2018-10-14 MED ORDER — CEFAZOLIN SODIUM-DEXTROSE 1-4 GM/50ML-% IV SOLN
1.0000 g | Freq: Three times a day (TID) | INTRAVENOUS | Status: DC
  Administered 2018-10-14 – 2018-10-17 (×10): 1 g via INTRAVENOUS
  Filled 2018-10-14 (×12): qty 50

## 2018-10-14 MED ORDER — ENOXAPARIN SODIUM 60 MG/0.6ML ~~LOC~~ SOLN
1.0000 mg/kg | Freq: Two times a day (BID) | SUBCUTANEOUS | Status: DC
  Administered 2018-10-14 – 2018-10-17 (×6): 60 mg via SUBCUTANEOUS
  Filled 2018-10-14 (×6): qty 0.6

## 2018-10-14 NOTE — Progress Notes (Signed)
Bilateral lower extremities venous duplex exam completed.  Positive findings consistent with acute deep vein thrombosis involving the right common femoral vein, and right femoral vein. Latrish Mogel H Kyrie Bun(RDMS RVT) 10/14/18 12:17 PM

## 2018-10-14 NOTE — Progress Notes (Signed)
ANTICOAGULATION CONSULT NOTE - Initial Consult  Pharmacy Consult for LMWH  Indication: DVT  Allergies  Allergen Reactions  . Aspirin     REACTION: pt states GI INTOL to aspirin  . Iodine     REACTION: GI UPSET  . Irbesartan-Hydrochlorothiazide     REACTION: GI UPSET  . Penicillins     REACTION: GI UPSET Has patient had a PCN reaction causing immediate rash, facial/tongue/throat swelling, SOB or lightheadedness with hypotension: No Has patient had a PCN reaction causing severe rash involving mucus membranes or skin necrosis: No Has patient had a PCN reaction that required hospitalization: No Has patient had a PCN reaction occurring within the last 10 years: No If all of the above answers are "NO", then may proceed with Cephalosporin use.    Patient Measurements: Height: 5\' 3"  (160 cm) Weight: 128 lb 4.9 oz (58.2 kg) IBW/kg (Calculated) : 52.4 Heparin Dosing Weight: 58.2 kg  Vital Signs: Temp: 98.2 F (36.8 C) (12/10 1242) Temp Source: Oral (12/10 1242) BP: 117/57 (12/10 1242) Pulse Rate: 76 (12/10 1242)  Labs: Recent Labs    10/12/18 0224 10/12/18 1043 10/13/18 0505 10/14/18 0439  HGB 11.2*  --  11.1* 10.4*  HCT 35.2*  --  34.3* 33.8*  PLT 129*  --  104* 122*  APTT 31  --   --   --   LABPROT 13.9  --   --   --   INR 1.08  --   --   --   HEPARINUNFRC  --  <0.10*  --   --   CREATININE 1.00  --  0.69 0.64    Estimated Creatinine Clearance: 41 mL/min (by C-G formula based on SCr of 0.64 mg/dL).   Medical History: Past Medical History:  Diagnosis Date  . Anxiety   . Aspirin intolerance   . Bronchitis, mucopurulent recurrent (HCC)   . CAD (coronary artery disease)    2008, question slight anteroapical ischemia  . Carotid artery disease (HCC)    Doppler, December, 2007, bilateral 0-39%  . Dementia (HCC)    Senile dementia with delusional features  . Diabetes mellitus   . DJD (degenerative joint disease)   . Ejection fraction    EF 70%, nuclear, 2007  .  Esophageal stricture   . GERD (gastroesophageal reflux disease)   . Hematoma    Large hematoma from fall, November, 2009, Coumadin stopped  . Hemorrhoids   . Hiatal hernia   . Hx of CABG    2003, ... LIMA not used... small caliber  . Hyperlipidemia   . Hypertension   . IBS (irritable bowel syndrome)   . Nephrolithiasis   . Obesity   . Osteoporosis   . Palpitations   . Pulmonary embolism (HCC)   . Right knee injury   . Senile dementia with delusional features (HCC)   . SOB (shortness of breath)   . Subclavian arterial stenosis (HCC)    PCI  . Warfarin anticoagulation    stopped 09/2008 with large hematoma from fall    Medications:  Medications Prior to Admission  Medication Sig Dispense Refill Last Dose  . aluminum-magnesium hydroxide-simethicone (MAALOX) 200-200-20 MG/5ML SUSP Take 30 mLs by mouth every 6 (six) hours as needed (heartburn or indigestion).   unknown  . atorvastatin (LIPITOR) 20 MG tablet Take 20 mg by mouth every evening.    10/10/2018 at Unknown time  . bumetanide (BUMEX) 0.5 MG tablet Take 0.5 mg by mouth daily.   10/11/2018 at 0900  .  Calcium 600-200 MG-UNIT tablet Take 1 tablet by mouth 2 (two) times daily.   10/11/2018 at 0900  . chlorhexidine (PERIDEX) 0.12 % solution Use as directed 15 mLs in the mouth or throat daily.    10/11/2018 at 0900  . clopidogrel (PLAVIX) 75 MG tablet TAKE 1 TABLET BY MOUTH EVERY DAY 30 tablet 6 10/11/2018 at 0900  . Dextromethorphan-guaiFENesin 5-100 MG/5ML LIQD Take 5 mLs by mouth every 6 (six) hours as needed (cough).   unknown  . famotidine (PEPCID) 20 MG tablet Take 20 mg by mouth daily.   10/11/2018 at 0900  . glimepiride (AMARYL) 1 MG tablet Take 1/2 tablet by mouth once daily (Patient taking differently: Take 1 mg by mouth daily with breakfast. ) 30 tablet 5 10/11/2018 at 0900  . guaifenesin (ROBITUSSIN) 100 MG/5ML syrup Take 200 mg by mouth 4 (four) times daily as needed for cough.   unknown  . loperamide (IMODIUM) 2 MG capsule  Take 2 mg by mouth daily as needed for diarrhea or loose stools.   unknown  . losartan (COZAAR) 50 MG tablet Take 50 mg by mouth every evening.    10/10/2018 at 2100  . magnesium hydroxide (MILK OF MAGNESIA) 400 MG/5ML suspension Take 30 mLs by mouth daily as needed for mild constipation.   unknown  . metFORMIN (GLUCOPHAGE) 1000 MG tablet Take 1,000 mg by mouth 2 (two) times daily with a meal.   10/11/2018 at 0900  . metoprolol succinate (TOPROL-XL) 25 MG 24 hr tablet Take 25 mg by mouth daily.   10/11/2018 at 0900  . QUEtiapine (SEROQUEL) 25 MG tablet Take 0.5 tablets (12.5 mg total) by mouth at bedtime. 15 tablet 3 10/10/2018 at 2100  . vitamin B-12 (CYANOCOBALAMIN) 1000 MCG tablet Take 1,000 mcg by mouth daily.   10/11/2018 at 0900  . acetaminophen (TYLENOL) 325 MG tablet Take 650 mg by mouth every 6 (six) hours as needed for mild pain.   unknown  . bumetanide (BUMEX) 0.5 MG tablet Take 1 tablet (0.5 mg total) by mouth daily. (Patient not taking: Reported on 10/11/2018) 30 tablet 11 Completed Course at Unknown time  . ibandronate (BONIVA) 150 MG tablet Take 1 tablet (150 mg total) by mouth every 30 (thirty) days. Take in the morning with a full glass of water, on an empty stomach, and do not take anything else by mouth or lie down for the next 30 min. 1 tablet 11 unknown    Assessment: 84 yo W with acute DVT involving R common femoral vein and R femoral vein.  Pharmacy consulted to dose LMWH.   Hg 10.4, PLTC low at 122.  SCr WNL.   Goal of Therapy:  Monitor platelets by anticoagulation protocol: Yes   Plan:  DC LMWH 40 qday LMWH 1 mg/kg sq q12 = 60 q12 F/u CBC and SCr  Herby Abraham, Pharm.D [phone] 10/14/2018 3:32 PM

## 2018-10-14 NOTE — Progress Notes (Signed)
Pharmacy Antibiotic Note  [name] HawkingMildred K [name] is a 84 y.o. female admitted on 10/11/2018 with cellulitis. Patient was started on broad spectrum antibiotics on admission (cefepime, vancomycin, and metronidazole). Antibiotics are being de-escalated and pharmacy is consulted today to dose cefazolin.  Today, 10/14/18  WBC WNL  SCr 0.64 is stable, WNL. CrCl ~41 mL/min  Afebrile  Plan:  Cefazolin 1 g IV q8h  Follow renal function  F/U culture results and sensitivities  Height: 5\' 3"  (160 cm) Weight: 128 lb 4.9 oz (58.2 kg) IBW/kg (Calculated) : 52.4  Temp (24hrs), Avg:98.4 F (36.9 C), Min:98.1 F (36.7 C), Max:98.6 F (37 C)  Recent Labs  Lab 10/11/18 2035 10/11/18 2045 10/12/18 0224 10/13/18 0505 10/14/18 0439  WBC 16.4*  --  15.6* 11.8* 8.8  CREATININE 1.03*  --  1.00 0.69 0.64  LATICACIDVEN  --  1.37  --   --   --     Estimated Creatinine Clearance: 41 mL/min (by C-G formula based on SCr of 0.64 mg/dL).    Allergies  Allergen Reactions  . Aspirin     REACTION: pt states GI INTOL to aspirin  . Iodine     REACTION: GI UPSET  . Irbesartan-Hydrochlorothiazide     REACTION: GI UPSET  . Penicillins     REACTION: GI UPSET Has patient had a PCN reaction causing immediate rash, facial/tongue/throat swelling, SOB or lightheadedness with hypotension: No Has patient had a PCN reaction causing severe rash involving mucus membranes or skin necrosis: No Has patient had a PCN reaction that required hospitalization: No Has patient had a PCN reaction occurring within the last 10 years: No If all of the above answers are "NO", then may proceed with Cephalosporin use.    Antimicrobials this admission: 12/7 vanc >>  12/10 12/7 cefepime >>  12/10 12/7 metronidazole >> 12/10 12/10 cefazolin >>   Dose adjustments this admission:  Microbiology results: 12/7 BCx: no growth 1 day 12/7 UCx: NGF  12/8 MRSA PCR: Negative  Thank you for allowing pharmacy to be a part of this  patient's care.  Cindi CarbonMary M Mandee Pluta, PharmD 10/14/18 11:58 AM

## 2018-10-14 NOTE — Progress Notes (Signed)
PROGRESS NOTE    [name]  ZOX:[id number] DOB: 1929/01/14 DOA: 10/11/2018 PCP: Kristian Covey, MD   Brief Narrative:  HPI from Dr Jamison Oka K Cobleis Lanice Folden 84 y.o.femalewithCAD s/p CABG, subclavian arterial stenosis, hx of PE, prior warfarin use (discontinued due to fall and hematoma), HTN, dementia, who presented from [facility] nursing facility after acute R lower extremity rash and swelling. History mostly obtained from ED who communicated with [facility] staff. Patient reportedly had [name] mechanical fall earlier in the week without clear injury. Per staff unclear when rash began, although first noted on evening of admission. Patient herself denies any chest pain, fevers, cough, or other symptoms -- limited history due to dementia. On arrival to [facility] ED, temperature 100.5 with leukocytosis. Pt admitted for further management.  Assessment & Plan:   Active Problems:   Rash   Leg swelling   Sepsis 2/2 ?RLE cellulitis Febrile, with leukocytosis on admission Currently afebrile, with resolving leukocytosis RLE cellulitis, with no significant improvement since admission (see image below) Lactic acid 1.37 BC x2 NGTD X-ray right lower extremity no fracture or dislocation, no osseous infx Venous Doppler notable for DVT S/P IV fluids Continue IV vancomycin, cefepime, Flagyl -> transition to ancef today Consider consulting ID if no significant improvement within the next day or so  Right lower extremity DVT: (apparently pt had been on warfarin in past which was d/c'd due to hx fall with hematoma.  Discussed risk/benefits with niece.  At this point in time, planning for anticoagulation.  Discussed with staff at her facility who did not describe her as [name] particularly high fall risk.  It is noted though, that she's had some ED visits for falls). - start lovenox, stop plavix  Hypokalemia Replace PRN  Chronic diastolic HF BNP 287, above from baseline Chest x-ray showed left  lower lobe atelectasis, cannot exclude pneumonia CTA chest negative for PE, no other acute abnormality Echo done 10/2018 showed EF of 60 to 65%, grade 1 diastolic dysfunction Will hold home Bumex, losartan, Toprol  History of CAD, s/p CABG Denies any chest pain Continue Lipitor Discontinue plavix due to above (discussed with cardiology, per history in chart, CABG was in 2003 and I don't see cath with stent placement in our system.  Per cards, if no stent within last 10 years, ok to d/c plavix.  As I don't see this, will go ahead and d/c - looks like stents were placed in early 2000's per my chart review).  Diabetes mellitus type 2 A1c 5.7 SSI, Accu-Checks, hypoglycemic protocol Hold home glimepiride, metformin  Hypertension BP soft Continue to hold Bumex, losartan, Toprol  History of dementia Continue Seroquel  Thrombocytopenia: follow  DVT prophylaxis: lovenox Code Status: full  Family Communication: niece Disposition Plan: pending transition to PO anticoagulant   Consultants:   none  Procedures:  Echo Study Conclusions  - Left ventricle: The cavity size was normal. Systolic function was   normal. The estimated ejection fraction was in the range of 60%   to 65%. Wall motion was normal; there were no regional wall   motion abnormalities. Doppler parameters are consistent with   abnormal left ventricular relaxation (grade 1 diastolic   dysfunction). - Aortic valve: Transvalvular velocity was within the normal range.   There was no stenosis. There was no regurgitation. - Mitral valve: Transvalvular velocity was within the normal range.   There was no evidence for stenosis. There was trivial   regurgitation. - Right ventricle: The cavity size  was normal. Wall thickness was   normal. Systolic function was normal. - Atrial septum: No defect or patent foramen ovale was identified   by color flow Doppler. - Tricuspid valve: There was trivial regurgitation.  LE  [place] Summary: Right: Findings consistent with acute deep vein thrombosis involving the right common femoral vein, and right femoral vein. No cystic structure found in the popliteal fossa. Left: There is no evidence of deep vein thrombosis in the lower extremity. However, portions of this examination were limited- see technologist comments above. No cystic structure found in the popliteal fossa.   Antimicrobials:  Anti-infectives (From admission, onward)   Start     Dose/Rate Route Frequency Ordered Stop   10/14/18 1300  ceFAZolin (ANCEF) IVPB 1 g/50 mL premix     1 g 100 mL/hr over 30 Minutes Intravenous Every 8 hours 10/14/18 1149     10/13/18 1100  vancomycin (VANCOCIN) 1,250 mg in sodium chloride 0.9 % 250 mL IVPB  Status:  Discontinued     1,250 mg 166.7 mL/hr over 90 Minutes Intravenous Every 48 hours 10/12/18 0424 10/12/18 1230   10/12/18 2200  vancomycin (VANCOCIN) 500 mg in sodium chloride 0.9 % 100 mL IVPB  Status:  Discontinued     500 mg 100 mL/hr over 60 Minutes Intravenous Every 24 hours 10/12/18 1230 10/14/18 1114   10/12/18 1000  ceFEPIme (MAXIPIME) 1 g in sodium chloride 0.9 % 100 mL IVPB  Status:  Discontinued     1 g 200 mL/hr over 30 Minutes Intravenous Every 12 hours 10/12/18 0036 10/14/18 1114   10/11/18 2200  ceFEPIme (MAXIPIME) 2 g in sodium chloride 0.9 % 100 mL IVPB     2 g 200 mL/hr over 30 Minutes Intravenous  Once 10/11/18 2154 10/11/18 2345   10/11/18 2200  metroNIDAZOLE (FLAGYL) IVPB 500 mg  Status:  Discontinued     500 mg 100 mL/hr over 60 Minutes Intravenous Every 8 hours 10/11/18 2154 10/14/18 1114   10/11/18 2200  vancomycin (VANCOCIN) IVPB 1000 mg/200 mL premix     1,000 mg 200 mL/hr over 60 Minutes Intravenous  Once 10/11/18 2154 10/12/18 0050   10/11/18 2145  cefTRIAXone (ROCEPHIN) 2 g in sodium chloride 0.9 % 100 mL IVPB  Status:  Discontinued     2 g 200 mL/hr over 30 Minutes Intravenous Every 24 hours 10/11/18 2144 10/11/18 2151      Subjective: No complaints. RLE leg pain on palpation.  Objective: Vitals:   10/13/18 1522 10/13/18 2137 10/14/18 0516 10/14/18 1242  BP: (!) 109/54 (!) 126/57 (!) 117/48 (!) 117/57  Pulse: 64 65 65 76  Resp: 16 16 18    Temp: 98.4 F (36.9 C) 98.6 F (37 C) 98.1 F (36.7 C) 98.2 F (36.8 C)  TempSrc: Oral Oral Oral Oral  SpO2: 97% 96% 97% 97%  Weight:      Height:        Intake/Output Summary (Last 24 hours) at 10/14/2018 2002 Last data filed at 10/14/2018 1830 Gross per 24 hour  Intake 910 ml  Output -  Net 910 ml   Filed Weights   10/12/18 0207  Weight: 58.2 kg    Examination:  General exam: Appears calm and comfortable  Respiratory system: Clear to auscultation. Respiratory effort normal. Cardiovascular system: S1 & S2 heard, RRR.  Gastrointestinal system: Abdomen is nondistended, soft and nontende Central nervous system: Alert and disoriented. No focal neurological deficits. Extremities: RLE erythema and warmth, tender to palpation, small petechiae Psychiatry: Judgement  and insight appear normal. Mood & affect appropriate.       Data Reviewed: I have personally reviewed following labs and imaging studies  CBC: Recent Labs  Lab 10/11/18 2035 10/12/18 0224 10/13/18 0505 10/14/18 0439  WBC 16.4* 15.6* 11.8* 8.8  NEUTROABS 14.9* 13.7* 9.8* 6.4  HGB 12.1 11.2* 11.1* 10.4*  HCT 37.8 35.2* 34.3* 33.8*  MCV 96.4 99.4 95.5 98.8  PLT 148* 129* 104* 122*   Basic Metabolic Panel: Recent Labs  Lab 10/11/18 2035 10/12/18 0224 10/13/18 0505 10/14/18 0439  NA 142 143 144 141  K 3.7 3.1* 3.5 3.5  CL 108 110 117* 114*  CO2 24 24 20* 22  GLUCOSE 218* 213* 123* 110*  BUN 23 28* 18 14  CREATININE 1.03* 1.00 0.69 0.64  CALCIUM 8.7* 8.4* 7.7* 7.5*   GFR: Estimated Creatinine Clearance: 41 mL/min (by C-G formula based on SCr of 0.64 mg/dL). Liver Function Tests: No results for input(s): AST, ALT, ALKPHOS, BILITOT, PROT, ALBUMIN in the last 168  hours. No results for input(s): LIPASE, AMYLASE in the last 168 hours. No results for input(s): AMMONIA in the last 168 hours. Coagulation Profile: Recent Labs  Lab 10/12/18 0224  INR 1.08   Cardiac Enzymes: No results for input(s): CKTOTAL, CKMB, CKMBINDEX, TROPONINI in the last 168 hours. BNP (last 3 results) No results for input(s): PROBNP in the last 8760 hours. HbA1C: Recent Labs    10/13/18 0505  HGBA1C 5.7*   CBG: Recent Labs  Lab 10/13/18 1656 10/13/18 2326 10/14/18 0733 10/14/18 1125 10/14/18 1652  GLUCAP 116* 101* 93 141* 108*   Lipid Profile: No results for input(s): CHOL, HDL, LDLCALC, TRIG, CHOLHDL, LDLDIRECT in the last 72 hours. Thyroid Function Tests: No results for input(s): TSH, T4TOTAL, FREET4, T3FREE, THYROIDAB in the last 72 hours. Anemia Panel: No results for input(s): VITAMINB12, FOLATE, FERRITIN, TIBC, IRON, RETICCTPCT in the last 72 hours. Sepsis Labs: Recent Labs  Lab 10/11/18 2045  LATICACIDVEN 1.37    Recent Results (from the past 240 hour(s))  Blood culture (routine x 2)     Status: None (Preliminary result)   Collection Time: 10/11/18  8:35 PM  Result Value Ref Range Status   Specimen Description   Final    BLOOD RIGHT HAND Performed at [facility], [address] [place], [place] [address]    Special Requests   Final    BOTTLES DRAWN AEROBIC AND ANAEROBIC Blood Culture adequate volume Performed at [facility], [address] [place], [place] [address]    Culture   Final    NO GROWTH 2 DAYS Performed at [facility], [address], [place], [place] [address]    Report Status PENDING  Incomplete  Blood culture (routine x 2)     Status: None (Preliminary result)   Collection Time: 10/11/18  9:20 PM  Result Value Ref Range Status   Specimen Description   Final    BLOOD LEFT ARM Performed at [facility], [address], [place], [place] [address]    Special  Requests   Final    BOTTLES DRAWN AEROBIC AND ANAEROBIC Blood Culture adequate volume Performed at [facility], [address] [place], [place] [address]    Culture   Final    NO GROWTH 2 DAYS Performed at [facility], [address] [place], [place] [address]    Report Status PENDING  Incomplete  Urine culture  Status: None   Collection Time: 10/11/18 10:13 PM  Result Value Ref Range Status   Specimen Description   Final    URINE, RANDOM Performed at [facility], [address], [place], [place] [address]    Special Requests   Final    NONE Performed at [facility], [address] [place], [place] [address]    Culture   Final    NO GROWTH Performed at [facility], [address] [place], [place] [address]    Report Status 10/13/2018 FINAL  Final  MRSA PCR Screening     Status: None   Collection Time: 10/12/18  3:27 AM  Result Value Ref Range Status   MRSA by PCR NEGATIVE NEGATIVE Final    Comment:        The GeneXpert MRSA Assay (FDA approved for NASAL specimens only), is one component of Valora Norell comprehensive MRSA colonization surveillance program. It is not intended to diagnose MRSA infection nor to guide or monitor treatment for MRSA infections. Performed at [facility], [address] [place], [place] [address]          Radiology Studies: Vas [place] Lower Extremity Venous (dvt)  Result Date: 10/14/2018  Lower Venous Study Indications: Swelling, Edema, and Erythema.  Limitations: Body habitus and severe pain. Performing Technologist: Melodie Bouillon  Examination Guidelines: Sarajean Dessert complete evaluation includes B-mode imaging, spectral Doppler, color Doppler, and power Doppler as needed of all accessible portions of each vessel. Bilateral testing is considered an integral part of Maxx Pham complete examination. Limited examinations for reoccurring indications may be performed as noted.   Right Venous Findings: +---------+---------------+---------+-----------+----------------+-------------+          CompressibilityPhasicitySpontaneityProperties      Summary       +---------+---------------+---------+-----------+----------------+-------------+ CFV      Partial                            partially       Acute                                                     re-cannalized                 +---------+---------------+---------+-----------+----------------+-------------+ SFJ      Partial                                            Acute         +---------+---------------+---------+-----------+----------------+-------------+ FV Prox  Partial                            partially       Acute                                                     re-cannalized                 +---------+---------------+---------+-----------+----------------+-------------+ FV Mid   Full                                                             +---------+---------------+---------+-----------+----------------+-------------+  FV DistalFull                                                             +---------+---------------+---------+-----------+----------------+-------------+ PFV      Full                                                             +---------+---------------+---------+-----------+----------------+-------------+ POP      Full                                                             +---------+---------------+---------+-----------+----------------+-------------+ PTV                                                         not well                                                                  visualized    +---------+---------------+---------+-----------+----------------+-------------+ PERO                                                        not well                                                                   visualized    +---------+---------------+---------+-----------+----------------+-------------+  Left Venous Findings: +---------+---------------+---------+-----------+----------+------------------+          CompressibilityPhasicitySpontaneityPropertiesSummary            +---------+---------------+---------+-----------+----------+------------------+ CFV      Full           Yes      Yes                                     +---------+---------------+---------+-----------+----------+------------------+ SFJ      Full                                                            +---------+---------------+---------+-----------+----------+------------------+  FV Prox  Full                                                            +---------+---------------+---------+-----------+----------+------------------+ FV Mid   Full                                                            +---------+---------------+---------+-----------+----------+------------------+ FV DistalFull                                                            +---------+---------------+---------+-----------+----------+------------------+ PFV      Full                                                            +---------+---------------+---------+-----------+----------+------------------+ POP      Full           Yes      Yes                  very poor flow                                                           detected           +---------+---------------+---------+-----------+----------+------------------+ PTV                                                   not well                                                                 visualized         +---------+---------------+---------+-----------+----------+------------------+ PERO                                                  not well  visualized         +---------+---------------+---------+-----------+----------+------------------+    Summary: Right: Findings consistent with acute deep vein thrombosis involving the right common femoral vein, and right femoral vein. No cystic structure found in the popliteal fossa. Left: There is no evidence of deep vein thrombosis in the lower extremity. However, portions of this examination were limited- see technologist comments above. No cystic structure found in the popliteal fossa.  *See table(s) above for measurements and observations. Electronically signed by Christopher Dickson MD onWaverly Ferrari 10/14/2018 at 2:02:18 PM.    Final         Scheduled Meds: . atorvastatin  20 mg Oral QPM  . chlorhexidine  15 mL Mouth/Throat Daily  . clopidogrel  75 mg Oral Daily  . enoxaparin (LOVENOX) injection  1 mg/kg Subcutaneous Q12H  . famotidine  20 mg Oral Daily  . insulin aspart  0-9 Units Subcutaneous TID WC  . QUEtiapine  12.5 mg Oral QHS   Continuous Infusions: . sodium chloride 10 mL/hr at 10/13/18 0856  .  ceFAZolin (ANCEF) IV Stopped (10/14/18 1350)     LOS: 2 days    Time spent: over 30 min    Lacretia Nicksaldwell Powell, MD [facility] Pager [phone]  If 7PM-7AM, please contact night-coverage [email or web address] Password [email or web address] 10/14/2018, 8:02 PM

## 2018-10-14 NOTE — Progress Notes (Signed)
Assumed care of patient from previous nurse. Bedside report given to this RN. Agree with previous nurses assessment. Will continue to monitor pt carefully.

## 2018-10-14 NOTE — Progress Notes (Signed)
PT Cancellation Note  Patient Details Name: Henrene HawkingMildred K Burdell MRN: [id number] DOB: [birth year]   Cancelled Treatment:    Reason Eval/Treat Not Completed: Other (comment)(dopplers to r/o DVT pending. Will follow. )   Tamala SerUhlenberg, Lovie Zarling Kistler PT 10/14/2018  Acute Rehabilitation Services Pager [phone] Office [phone]

## 2018-10-14 NOTE — Progress Notes (Signed)
PT Cancellation Note  Patient Details Name: [name] HawkingMildred K [name] MRN: [id number] DOB: 08/16/1929   Cancelled Treatment:    Reason Eval/Treat Not Completed: Medical issues which prohibited therapy(noted pt has DVT, awaiting MD orders. )  Tamala SerUhlenberg, Amaurie Wandel Kistler PT 10/14/2018  Acute Rehabilitation Services Pager [phone] Office [phone]

## 2018-10-15 ENCOUNTER — Inpatient Hospital Stay (HOSPITAL_COMMUNITY): Payer: Medicare Other

## 2018-10-15 DIAGNOSIS — R21 Rash and other nonspecific skin eruption: Secondary | ICD-10-CM

## 2018-10-15 DIAGNOSIS — M7989 Other specified soft tissue disorders: Secondary | ICD-10-CM

## 2018-10-15 LAB — BASIC METABOLIC PANEL
Anion gap: 7 (ref 5–15)
BUN: 8 mg/dL (ref 8–23)
CO2: 22 mmol/L (ref 22–32)
Calcium: 7.6 mg/dL — ABNORMAL LOW (ref 8.9–10.3)
Chloride: 114 mmol/L — ABNORMAL HIGH (ref 98–111)
Creatinine, Ser: 0.62 mg/dL (ref 0.44–1.00)
GFR calc Af Amer: >60 mL/min (ref >60)
GFR calc non Af Amer: >60 mL/min (ref >60)
Glucose, Bld: 112 mg/dL — ABNORMAL HIGH (ref 70–99)
Potassium: 3.7 mmol/L (ref 3.5–5.1)
Sodium: 143 mmol/L (ref 135–145)

## 2018-10-15 LAB — BLOOD GAS, ARTERIAL
Acid-base deficit: 0.1 mmol/L (ref 0.0–2.0)
Bicarbonate: 23.2 mmol/L (ref 20.0–28.0)
Drawn by: 257881
O2 Saturation: 95.7 %
Patient temperature: 98.6
pCO2 arterial: 35.6 mmHg (ref 32.0–48.0)
pH, Arterial: 7.43 (ref 7.350–7.450)
pO2, Arterial: 80.4 mmHg — ABNORMAL LOW (ref 83.0–108.0)

## 2018-10-15 LAB — CBC WITH DIFFERENTIAL/PLATELET
Abs Immature Granulocytes: 0.04 10*3/uL (ref 0.00–0.07)
Basophils Absolute: 0 10*3/uL (ref 0.0–0.1)
Basophils Relative: 1 %
EOS PCT: 7 %
Eosinophils Absolute: 0.4 10*3/uL (ref 0.0–0.5)
HCT: 33.3 % — ABNORMAL LOW (ref 36.0–46.0)
Hemoglobin: 10.4 g/dL — ABNORMAL LOW (ref 12.0–15.0)
Immature Granulocytes: 1 %
Lymphocytes Relative: 19 %
Lymphs Abs: 1.1 10*3/uL (ref 0.7–4.0)
MCH: 30.8 pg (ref 26.0–34.0)
MCHC: 31.2 g/dL (ref 30.0–36.0)
MCV: 98.5 fL (ref 80.0–100.0)
Monocytes Absolute: 0.5 10*3/uL (ref 0.1–1.0)
Monocytes Relative: 9 %
Neutro Abs: 3.9 10*3/uL (ref 1.7–7.7)
Neutrophils Relative %: 63 %
Platelets: 144 10*3/uL — ABNORMAL LOW (ref 150–400)
RBC: 3.38 MIL/uL — ABNORMAL LOW (ref 3.87–5.11)
RDW: 13.4 % (ref 11.5–15.5)
WBC: 6 10*3/uL (ref 4.0–10.5)
nRBC: 0 % (ref 0.0–0.2)

## 2018-10-15 LAB — GLUCOSE, CAPILLARY
Glucose-Capillary: 110 mg/dL — ABNORMAL HIGH (ref 70–99)
Glucose-Capillary: 122 mg/dL — ABNORMAL HIGH (ref 70–99)

## 2018-10-15 LAB — MAGNESIUM: Magnesium: 2.1 mg/dL (ref 1.7–2.4)

## 2018-10-15 MED ORDER — ONDANSETRON HCL 4 MG/2ML IJ SOLN: 4.0000 mg | Freq: Four times a day (QID) | INTRAMUSCULAR | Status: DC | PRN

## 2018-10-15 MED ORDER — ENSURE ENLIVE PO LIQD
237.0000 mL | Freq: Two times a day (BID) | ORAL | Status: DC
  Administered 2018-10-17: 237 mL via ORAL

## 2018-10-15 NOTE — Progress Notes (Signed)
Called MD concerning pt looking very pale and lethargic, said she was having trouble breathing, and not feeling very good.  VSS. Order was placed. Will continue to monitor.

## 2018-10-15 NOTE — Evaluation (Signed)
Clinical/Bedside Swallow Evaluation Patient Details  Name: [name] MRN: [id number] Date of Birth: 1929-01-05  Today's Date: 10/15/2018 Time: SLP Start Time (ACUTE ONLY): 1451 SLP Stop Time (ACUTE ONLY): 1543(MD present during approximately 30 minutes of session) SLP Time Calculation (min) (ACUTE ONLY): 52 min  Past Medical History:  Past Medical History:  Diagnosis Date  . Anxiety   . Aspirin intolerance   . Bronchitis, mucopurulent recurrent (HCC)   . CAD (coronary artery disease)    2008, question slight anteroapical ischemia  . Carotid artery disease (HCC)    Doppler, December, 2007, bilateral 0-39%  . Dementia (HCC)    Senile dementia with delusional features  . Diabetes mellitus   . DJD (degenerative joint disease)   . Ejection fraction    EF 70%, nuclear, 2007  . Esophageal stricture   . GERD (gastroesophageal reflux disease)   . Hematoma    Large hematoma from fall, November, 2009, Coumadin stopped  . Hemorrhoids   . Hiatal hernia   . Hx of CABG    2003, ... LIMA not used... small caliber  . Hyperlipidemia   . Hypertension   . IBS (irritable bowel syndrome)   . Nephrolithiasis   . Obesity   . Osteoporosis   . Palpitations   . Pulmonary embolism (HCC)   . Right knee injury   . Senile dementia with delusional features (HCC)   . SOB (shortness of breath)   . Subclavian arterial stenosis (HCC)    PCI  . Warfarin anticoagulation    stopped 09/2008 with large hematoma from fall   Past Surgical History:  Past Surgical History:  Procedure Laterality Date  . ABDOMINAL HYSTERECTOMY    . APPENDECTOMY    . cabg  1 vessel w/SVG to LAD  2003  . CATARACT EXTRACTION    . FRACTURE SURGERY    . HEMORRHOID SURGERY    . mult orthopedic procedures     Dr. Arnette Norris arm, CTS,cyst removal  . open heart surgery     HPI:  84 yo female adm to [facility] with rash on her right leg- with fever - concern for possible cellulitis.  Noted to have right leg DVT.  Has h/o  Pulmonary Embolism and was taken off blood thinner due to fall risk.  Pt resides at [facility] and has dementia, also with h/o dysphagia requiring dilatations - last being approximately 5 years ago.  HCPOA states plan is not for pt to have redilatation completed as due to her advanced age and dementia, medical issues.  HCPOA advised to MD that her wish is for pt to be comfortable stating "she has not had a life in a long time" and desired DNR status. Swallow evaluation ordered due to pt becoming ashen with breathing difficulties earlier after consuming mashed potatoes.  Per prior  endoscopies - pt with gastritis, ulcerative esophagitis, peptic structures, etc.  Per family, she has been on a puree/thin diet prior to admission but intake is poor.  She was seen for SLP treatment but due to cognitive deficits, was unable to functionally participate.     Assessment / Plan / Recommendation Clinical Impression  Patient presents with signs of suspected primary pharyngo-esophageal dysphagia - ? motility deficits? with h/o esophagitis, gastritis, peptic strictures.  Concern for possible oral candidiasis present  As pt with coating on tongue - pt denies lingual pain.   Pt self fed icecream, roll coated with butter and assisted to hold her cup for tea intake.  Large sequential  straw boluses of tea resulting in pt wincing, ? effortful swallows with immediate post-swallow throat clearing. Small single boluses clinically tolerated better.  Pt took icecream to aid oral transit/clearance of bite of roll which was effective to transit bolus.    Suspect pt's intake may also be decreased due to her dysphagia and not just dietary restriction.    Pt today only took approximately 2 ounces of icecream, 2 ounces of tea and single bite of roll before stopping intake stating she was "done".    Anticipate she will not meet nutritional needs.  Less viscous foods *eg icecream, jello, canned green beans, peaches, etc* will likely  be tolerated much better than dense foods.     Recommend pt consume liquids (small single boluses) during meals.  Cease intake if she indicates discomfort (points to sternum, winces,  multiple swallows, etc).     RN informed of recommendations.  Given this pt plan is comfort - No SLP follow up indicated.   SLP Visit Diagnosis: Dysphagia, pharyngoesophageal phase (R13.14);Dysphagia, unspecified (R13.10)    Aspiration Risk  Risk for inadequate nutrition/hydration;Moderate aspiration risk    Diet Recommendation Dysphagia 3 (Mech soft);Thin liquid- ground meats, extra gravy/sauces   Liquid Administration via: Cup;No straw Medication Administration: Whole meds with puree Supervision: Full supervision/cueing for compensatory strategies Compensations: Slow rate;Small sips/bites;Follow solids with liquid Postural Changes: Seated upright at 90 degrees;Remain upright for at least 30 minutes after po intake    Other  Recommendations Oral Care Recommendations: Oral care BID   Follow up Recommendations None      Frequency and Duration     n/a       Prognosis    n/a    Swallow Study   General Date of Onset: 10/15/18 HPI: 84 yo female adm to [facility] with rash on her right leg- with fever - concern for possible cellulitis.  Noted to have right leg DVT.  Has h/o Pulmonary Embolism and was taken off blood thinner due to fall risk.  Pt resides at [facility] and has dementia, also with h/o dysphagia requiring dilatations - last being approximately 5 years ago.  HCPOA states plan is not for pt to have redilatation completed as due to her advanced age and dementia, medical issues.  HCPOA advised to MD that her wish is for pt to be comfortable stating "she has not had a life in a long time" and desired DNR status. Swallow evaluation ordered due to pt becoming ashen with breathing difficulties earlier after consuming mashed potatoes.  Per prior  endoscopies - pt with gastritis, ulcerative esophagitis,  peptic structures, etc.  Per family, she has been on a puree/thin diet prior to admission but intake is poor.  She was seen for SLP treatment but due to cognitive deficits, was unable to functionally participate.   Type of Study: Bedside Swallow Evaluation Diet Prior to this Study: Regular;Thin liquids Temperature Spikes Noted: No Respiratory Status: Room air History of Recent Intubation: No Behavior/Cognition: Alert;Cooperative;Pleasant mood;Requires cueing(inconsistently follows directions) Oral Cavity Assessment: Other (comment)(concern for possible oral candidiasis - coating on tongue - pt denies discomfort) Oral Care Completed by SLP: Yes Oral Cavity - Dentition: Adequate natural dentition Vision: Functional for self-feeding Self-Feeding Abilities: Needs set up;Needs assist(with heavy cups, hand over hand assist) Patient Positioning: Upright in bed Baseline Vocal Quality: Normal Volitional Cough: Strong Volitional Swallow: Unable to elicit    Oral/Motor/Sensory Function Overall Oral Motor/Sensory Function: (? minimal left facial asymmetry, no other focal CN deficits apparent)  Ice Chips Ice chips: Not tested   Thin Liquid Thin Liquid: Impaired Presentation: Cup;Straw;Self Fed Pharyngeal  Phase Impairments: Multiple swallows;Throat Clearing - Immediate Other Comments: multiple swallows  appearing effortful and uncomfortable for pt with sequential swallows via straw, single small bolus from cup clinically tolerated better    Nectar Thick Nectar Thick Liquid: Not tested Other Comments: pt declined to consume Ensure stating "I know what that is"   Honey Thick Honey Thick Liquid: Not tested   Puree Puree: Within functional limits Presentation: Self Fed;Spoon Other Comments: icecream intake tolerated well - pt self fed without difficulties    Solid     Solid: Impaired Presentation: Self Fed Oral Phase Impairments: Impaired mastication Oral Phase Functional Implications: Prolonged  oral transit Other Comments: small bite of soft roll with extra butter - pt used icecream to help transit bolus effectively      Chales AbrahamsKimball, Veronica Guerrant Ann 10/15/2018,4:23 PM  Donavan Burnetamara Anneli Bing, MS [facility] SLP Acute Rehab Services Pager [phone] Office [phone]

## 2018-10-15 NOTE — Progress Notes (Signed)
PT Cancellation Note  Patient Details Name: [name] MRN: [id number] DOB: 1929/04/24   Cancelled Treatment:    Reason Eval/Treat Not Completed: Medical issues which prohibited therapy(Per RN note this afternoon, pt lethargic, with pallor, and short of breath. PT to check back as schedule allows.  )   [name], PT Acute Rehabilitation Services Pager [phone]  Office [phone]    [name] 10/15/2018, 1:34 PM

## 2018-10-15 NOTE — Progress Notes (Signed)
PROGRESS NOTE    [name]  ZOX:[id number] DOB: 1929-01-18 DOA: 10/11/2018 PCP: Kristian Covey, MD   Brief Narrative:  84 year old with past medical history relevant for hypertension/hyperlipidemia coronary artery disease status post CABG, type 2 diabetes, subclavian artery stenosis, history of pulmonary embolism not on anticoagulation due to fall and hematoma, dementia, hypertension admitted from [facility] with acute right lower extremity rash and found to have right lower extremity DVT.   Assessment & Plan:   Active Problems:   Rash   Leg swelling  #) Shortness of breath/dysphagia: Suspect patient might of had an aspiration event.  Have low suspicion at this time the patient had a pulmonary embolism she is already anticoagulated, she is not hypoxic, she is not tachycardic.  If ABG shows profound hypoxia will repeat CTA that this was initially done on admission was negative. -Stat chest x-ray -ABG -Speech-language pathology consult -Oxygen for symptom management  #) Right lower extremity rash: Have low suspicion that this represents cellulitis this patient has been quite broad coverage.  Suspect that her low-grade fever is likely secondary to DVT. -Continue IV cefazolin started 10/14/2018 - Blood culture culture 10/11/2018- -Urine cultures 10/11/2018-  #) Right lower extremity DVT: Noted on ultrasound - Continue enoxaparin 1 mg/kg twice daily -Patient was discontinued on clopidogrel  #) Hypertension/hyperlipidemia coronary artery disease status post CABG: -Continue atorvastatin 20 mg nightly - Discontinue clopidogrel 75 mg daily -Restart home metoprolol succinate 25 mg daily - Hold losartan 50 mg nightly  #) Type 2 diabetes on oral hypoglycemics: -Hold glimepiride 0.5 mg daily -Hold metformin thousand milligrams twice daily -Sliding scale insulin, AC at bedtime  #) Chronic lower extremity edema: -Hold bumetanide 0.5 mg daily  #)  Dementia/pain/psych: -Hold quetiapine 12.5 mg nightly  #) Osteoporosis: -Hold bisphosphonate  Fluids: Tolerating p.o. Electrolytes: Monitor and supplement Nutrition: Per above  Prophylaxis: Treatment dose enoxaparin  Disposition: Pending evaluation of shortness of breath  Full code   Consultants:   None  Procedures:  10/12/2018 echo:Left ventricle: The cavity size was normal. Systolic function was   normal. The estimated ejection fraction was in the range of 60%   to 65%. Wall motion was normal; there were no regional wall   motion abnormalities. Doppler parameters are consistent with   abnormal left ventricular relaxation (grade 1 diastolic   dysfunction). - Aortic valve: Transvalvular velocity was within the normal range.   There was no stenosis. There was no regurgitation. - Mitral valve: Transvalvular velocity was within the normal range.   There was no evidence for stenosis. There was trivial   regurgitation. - Right ventricle: The cavity size was normal. Wall thickness was   normal. Systolic function was normal. - Atrial septum: No defect or patent foramen ovale was identified   by color flow Doppler.  - Tricuspid valve: There was trivial regurgitation.  10/14/2018 lower extremity Doppler:Summary: Right: Findings consistent with acute deep vein thrombosis involving the right common femoral vein, and right femoral vein. No cystic structure found in the popliteal fossa. Left: There is no evidence of deep vein thrombosis in the lower extremity. However, portions of this examination were limited- see technologist comments above. No cystic structure found in the popliteal fossa.   Antimicrobials:  IV ceftriaxone 10/11/2018 to 10/12/2018  IV cefepime, metronidazole, vancomycin 10/12/2018 to 10/14/2018  IV cefazolin 10/14/2018 to ongoing   Subjective: This morning patient was in good spirits.  She apparently has had difficulty swallowing which is relatively new  though she  cannot tell me anything about it.  This was noted by her sitter.  She denies any nausea, vomiting, diarrhea, cough, congestion, rhinorrhea.  Review of systems is not accurate due to underlying dementia.  Later on this afternoon was called by the nurses patient appeared to be acutely short of breath and had wheezing in her upper airway.  Respiratory was called, stat chest x-ray was ordered, stat ABG was ordered.  Objective: Vitals:   10/14/18 2255 10/15/18 0453 10/15/18 0603 10/15/18 1206  BP: (!) 131/58 (!) 125/49 (!) 141/61 (!) 115/54  Pulse: 67 64 64 69  Resp: 18 20 17  (!) 22  Temp: 98.5 F (36.9 C) (!) 97.4 F (36.3 C) 98.4 F (36.9 C)   TempSrc: Oral Oral Oral   SpO2: 98% 98% 96% 98%  Weight:      Height:        Intake/Output Summary (Last 24 hours) at 10/15/2018 1356 Last data filed at 10/15/2018 1140 Gross per 24 hour  Intake 385.68 ml  Output 600 ml  Net -214.32 ml   Filed Weights   10/12/18 0207  Weight: 58.2 kg    Examination:  General exam: Appears calm and comfortable  Respiratory system: Clear to auscultation. Respiratory effort normal. Cardiovascular system: Regular rate and rhythm, no murmurs Gastrointestinal system: Soft, nondistended, no rebound or guarding, plus bowel sounds. Central nervous system: Alert but not oriented, grossly moving all extremities Extremities: Right greater than left lower extremity swelling r. Skin: Right lower extremity with erythema, warmth, swelling Psychiatry: Unable to assess due to underlying dementia    Data Reviewed: I have personally reviewed following labs and imaging studies  CBC: Recent Labs  Lab 10/11/18 2035 10/12/18 0224 10/13/18 0505 10/14/18 0439 10/15/18 0507  WBC 16.4* 15.6* 11.8* 8.8 6.0  NEUTROABS 14.9* 13.7* 9.8* 6.4 3.9  HGB 12.1 11.2* 11.1* 10.4* 10.4*  HCT 37.8 35.2* 34.3* 33.8* 33.3*  MCV 96.4 99.4 95.5 98.8 98.5  PLT 148* 129* 104* 122* 144*   Basic Metabolic Panel: Recent Labs    Lab 10/11/18 2035 10/12/18 0224 10/13/18 0505 10/14/18 0439 10/15/18 0507  NA 142 143 144 141 143  K 3.7 3.1* 3.5 3.5 3.7  CL 108 110 117* 114* 114*  CO2 24 24 20* 22 22  GLUCOSE 218* 213* 123* 110* 112*  BUN 23 28* 18 14 8   CREATININE 1.03* 1.00 0.69 0.64 0.62  CALCIUM 8.7* 8.4* 7.7* 7.5* 7.6*  MG  --   --   --   --  2.1   GFR: Estimated Creatinine Clearance: 41 mL/min (by C-G formula based on SCr of 0.62 mg/dL). Liver Function Tests: No results for input(s): AST, ALT, ALKPHOS, BILITOT, PROT, ALBUMIN in the last 168 hours. No results for input(s): LIPASE, AMYLASE in the last 168 hours. No results for input(s): AMMONIA in the last 168 hours. Coagulation Profile: Recent Labs  Lab 10/12/18 0224  INR 1.08   Cardiac Enzymes: No results for input(s): CKTOTAL, CKMB, CKMBINDEX, TROPONINI in the last 168 hours. BNP (last 3 results) No results for input(s): PROBNP in the last 8760 hours. HbA1C: Recent Labs    10/13/18 0505  HGBA1C 5.7*   CBG: Recent Labs  Lab 10/14/18 1125 10/14/18 1652 10/14/18 2021 10/15/18 0731 10/15/18 1143  GLUCAP 141* 108* 120* 110* 122*   Lipid Profile: No results for input(s): CHOL, HDL, LDLCALC, TRIG, CHOLHDL, LDLDIRECT in the last 72 hours. Thyroid Function Tests: No results for input(s): TSH, T4TOTAL, FREET4, T3FREE, THYROIDAB in the last 72  hours. Anemia Panel: No results for input(s): VITAMINB12, FOLATE, FERRITIN, TIBC, IRON, RETICCTPCT in the last 72 hours. Sepsis Labs: Recent Labs  Lab 10/11/18 2045  LATICACIDVEN 1.37    Recent Results (from the past 240 hour(s))  Blood culture (routine x 2)     Status: None (Preliminary result)   Collection Time: 10/11/18  8:35 PM  Result Value Ref Range Status   Specimen Description   Final    BLOOD RIGHT HAND Performed at [facility], [address], [place], [place] [address]    Special Requests   Final    BOTTLES DRAWN AEROBIC AND ANAEROBIC Blood Culture adequate  volume Performed at [facility], [address] [place], [place] [address]    Culture   Final    NO GROWTH 2 DAYS Performed at [facility], [address] [place], [place] [address]    Report Status PENDING  Incomplete  Blood culture (routine x 2)     Status: None (Preliminary result)   Collection Time: 10/11/18  9:20 PM  Result Value Ref Range Status   Specimen Description   Final    BLOOD LEFT ARM Performed at [facility], [address] [place], [place] [address]    Special Requests   Final    BOTTLES DRAWN AEROBIC AND ANAEROBIC Blood Culture adequate volume Performed at [facility], [address] [place], [place] [address]    Culture   Final    NO GROWTH 2 DAYS Performed at [facility], [address], [place], [place] [address]    Report Status PENDING  Incomplete  Urine culture     Status: None   Collection Time: 10/11/18 10:13 PM  Result Value Ref Range Status   Specimen Description   Final    URINE, RANDOM Performed at [facility], [address], [place], [place] [address]    Special Requests   Final    NONE Performed at [facility], [address] [place], [place] [address]    Culture   Final    NO GROWTH Performed at [facility], [address] [place], [place] [address]    Report Status 10/13/2018 FINAL  Final  MRSA PCR Screening     Status: None   Collection Time: 10/12/18  3:27 AM  Result Value Ref Range Status   MRSA by PCR NEGATIVE NEGATIVE Final    Comment:        The GeneXpert MRSA Assay (FDA approved for NASAL specimens only), is one component of a comprehensive MRSA colonization surveillance program. It is not intended to diagnose MRSA infection nor to guide or monitor treatment for MRSA infections. Performed at [facility], [address], [place], [place] [address]          Radiology  Studies: Dg Chest Port 1 View  Result Date: 10/15/2018 CLINICAL DATA:  Shortness of breath. EXAM: PORTABLE CHEST 1 VIEW COMPARISON:  Chest CT dated 10/12/2018 and chest x-ray dated 10/11/2018 FINDINGS: The heart size and mediastinal contours are within normal limits. Prominent left pericardial fat pad, unchanged. Aortic atherosclerosis. Stent in the region of the proximal left subclavian artery. Both lungs are clear. The visualized skeletal structures are unremarkable. IMPRESSION: No acute abnormalities. Aortic Atherosclerosis (ICD10-I70.0). Electronically Signed   By: Francene Boyers M.D.   On: 10/15/2018 13:37   Vas [place] Lower Extremity Venous (dvt)  Result Date: 10/14/2018  Lower  Venous Study Indications: Swelling, Edema, and Erythema.  Limitations: Body habitus and severe pain. Performing Technologist: Melodie Bouillon  Examination Guidelines: A complete evaluation includes B-mode imaging, spectral Doppler, color Doppler, and power Doppler as needed of all accessible portions of each vessel. Bilateral testing is considered an integral part of a complete examination. Limited examinations for reoccurring indications may be performed as noted.  Right Venous Findings: +---------+---------------+---------+-----------+----------------+-------------+          CompressibilityPhasicitySpontaneityProperties      Summary       +---------+---------------+---------+-----------+----------------+-------------+ CFV      Partial                            partially       Acute                                                     re-cannalized                 +---------+---------------+---------+-----------+----------------+-------------+ SFJ      Partial                                            Acute         +---------+---------------+---------+-----------+----------------+-------------+ FV Prox  Partial                            partially       Acute                                                      re-cannalized                 +---------+---------------+---------+-----------+----------------+-------------+ FV Mid   Full                                                             +---------+---------------+---------+-----------+----------------+-------------+ FV DistalFull                                                             +---------+---------------+---------+-----------+----------------+-------------+ PFV      Full                                                             +---------+---------------+---------+-----------+----------------+-------------+ POP      Full                                                             +---------+---------------+---------+-----------+----------------+-------------+  PTV                                                         not well                                                                  visualized    +---------+---------------+---------+-----------+----------------+-------------+ PERO                                                        not well                                                                  visualized    +---------+---------------+---------+-----------+----------------+-------------+  Left Venous Findings: +---------+---------------+---------+-----------+----------+------------------+          CompressibilityPhasicitySpontaneityPropertiesSummary            +---------+---------------+---------+-----------+----------+------------------+ CFV      Full           Yes      Yes                                     +---------+---------------+---------+-----------+----------+------------------+ SFJ      Full                                                            +---------+---------------+---------+-----------+----------+------------------+ FV Prox  Full                                                             +---------+---------------+---------+-----------+----------+------------------+ FV Mid   Full                                                            +---------+---------------+---------+-----------+----------+------------------+ FV DistalFull                                                            +---------+---------------+---------+-----------+----------+------------------+  PFV      Full                                                            +---------+---------------+---------+-----------+----------+------------------+ POP      Full           Yes      Yes                  very poor flow                                                           detected           +---------+---------------+---------+-----------+----------+------------------+ PTV                                                   not well                                                                 visualized         +---------+---------------+---------+-----------+----------+------------------+ PERO                                                  not well                                                                 visualized         +---------+---------------+---------+-----------+----------+------------------+    Summary: Right: Findings consistent with acute deep vein thrombosis involving the right common femoral vein, and right femoral vein. No cystic structure found in the popliteal fossa. Left: There is no evidence of deep vein thrombosis in the lower extremity. However, portions of this examination were limited- see technologist comments above. No cystic structure found in the popliteal fossa.  *See table(s) above for measurements and observations. Electronically signed by Waverly Ferrari MD on 10/14/2018 at 2:02:18 PM.    Final         Scheduled Meds: . atorvastatin  20 mg Oral QPM  . chlorhexidine  15 mL Mouth/Throat Daily  . enoxaparin (LOVENOX)  injection  1 mg/kg Subcutaneous Q12H  . famotidine  20 mg Oral Daily  . insulin aspart  0-9 Units Subcutaneous TID WC  . QUEtiapine  12.5 mg Oral QHS   Continuous Infusions: . sodium chloride 10 mL/hr at 10/13/18 0856  .  ceFAZolin (ANCEF) IV 1  g (10/15/18 0603)     LOS: 3 days    Time spent: 35    Delaine Lame, MD [facility]   If 7PM-7AM, please contact night-coverage [email or web address] Password [email or web address] 10/15/2018, 1:56 PM

## 2018-10-15 NOTE — Care Management Important Message (Signed)
Important Message  Patient Details  Name: [name] HawkingMildred K Lynes MRN: [id number] Date of Birth: [birth year]   Medicare Important Message Given:  Yes    [name], Kimberlea Schlag 10/15/2018, 11:41 AMImportant Message  Patient Details  Name: [name] HawkingMildred K Turnipseed MRN: [id number] Date of Birth: [birth year]   Medicare Important Message Given:  Yes    [name], Deshay Blumenfeld 10/15/2018, 11:41 AM

## 2018-10-15 NOTE — Care Management Note (Signed)
Case Management Note  Patient Details  Name: [name] MRN: [id number] Date of Birth: 1929-11-05  Subjective/Objective: Hx: dementia. From ALF-[facility]. PT cons-await recc.                   Action/Plan:d/c ALF   Expected Discharge Date:  (unknown)               Expected Discharge Plan:  Assisted Living / Rest Home  In-House Referral:  Clinical Social Work  Discharge planning Services  CM Consult  Post Acute Care Choice:    Choice offered to:     DME Arranged:    DME Agency:     HH Arranged:    HH Agency:     Status of Service:  In process, will continue to follow  If discussed at Long Length of Stay Meetings, dates discussed:    Additional Comments:  Lanier ClamMahabir, Winola Drum, RN 10/15/2018, 4:16 PM

## 2018-10-16 LAB — CBC WITH DIFFERENTIAL/PLATELET
Abs Immature Granulocytes: 0.02 10*3/uL (ref 0.00–0.07)
BASOS ABS: 0 10*3/uL (ref 0.0–0.1)
Basophils Relative: 0 %
EOS PCT: 7 %
Eosinophils Absolute: 0.4 10*3/uL (ref 0.0–0.5)
HCT: 32.7 % — ABNORMAL LOW (ref 36.0–46.0)
Hemoglobin: 10.2 g/dL — ABNORMAL LOW (ref 12.0–15.0)
IMMATURE GRANULOCYTES: 0 %
Lymphocytes Relative: 20 %
Lymphs Abs: 1.3 10*3/uL (ref 0.7–4.0)
MCH: 30.8 pg (ref 26.0–34.0)
MCHC: 31.2 g/dL (ref 30.0–36.0)
MCV: 98.8 fL (ref 80.0–100.0)
Monocytes Absolute: 0.6 10*3/uL (ref 0.1–1.0)
Monocytes Relative: 9 %
NEUTROS PCT: 64 %
Neutro Abs: 4 10*3/uL (ref 1.7–7.7)
Platelets: 167 10*3/uL (ref 150–400)
RBC: 3.31 MIL/uL — ABNORMAL LOW (ref 3.87–5.11)
RDW: 13.4 % (ref 11.5–15.5)
WBC: 6.3 10*3/uL (ref 4.0–10.5)
nRBC: 0 % (ref 0.0–0.2)

## 2018-10-16 LAB — BASIC METABOLIC PANEL
Anion gap: 5 (ref 5–15)
BUN: 6 mg/dL — ABNORMAL LOW (ref 8–23)
CO2: 25 mmol/L (ref 22–32)
Calcium: 8 mg/dL — ABNORMAL LOW (ref 8.9–10.3)
Chloride: 115 mmol/L — ABNORMAL HIGH (ref 98–111)
Creatinine, Ser: 0.53 mg/dL (ref 0.44–1.00)
GFR calc non Af Amer: >60 mL/min (ref >60)
Glucose, Bld: 120 mg/dL — ABNORMAL HIGH (ref 70–99)
Potassium: 3.8 mmol/L (ref 3.5–5.1)
Sodium: 145 mmol/L (ref 135–145)

## 2018-10-16 NOTE — Evaluation (Signed)
Physical Therapy Evaluation Patient Details Name: [name] MRN: [id number] DOB: 05/31/1929 Today's Date: 10/16/2018   History of Present Illness   84 y.o. female with CAD s/p CABG, subclavian arterial stenosis, hx of PE, HTN, dementia, who presented from [facility] nursing facility after acute R lower extremity rash and swelling, positive for DVT  Clinical Impression  [name] is very pleasant. Participated in  Transfer to [facility] and recliner with min assist. Appears that patient should be able to ambulate with assistance. Patient's  Prior level of function and caregiver needs unknown  Patient resides in ALF. Pt admitted with above diagnosis. Pt currently with functional limitations due to the deficits listed below (see PT Problem List).  Pt will benefit from skilled PT to increase their independence and safety with mobility to allow discharge to the venue listed below.       Follow Up Recommendations Home health PT(if able to return to ALF)? SNF    Equipment Recommendations  None recommended by PT    Recommendations for Other Services       Precautions / Restrictions Precautions Precautions: Fall      Mobility  Bed Mobility Overal bed mobility: Needs Assistance Bed Mobility: Supine to Sit     Supine to sit: Min assist     General bed mobility comments: extra time, min assist for trunk  Transfers Overall transfer level: Needs assistance Equipment used: 1 person hand held assist Transfers: Sit to/from Stand;Stand Pivot Transfers Sit to Stand: Min assist Stand pivot transfers: Min assist       General transfer comment: steady assist to stand from bed and BSC. Pivot from bed to [facility], to bed , to recliner. patient able to stand andtakes steps, reaches to armrests and rail to steady self.  Ambulation/Gait                Stairs            Wheelchair Mobility    Modified Rankin (Stroke Patients Only)       Balance Overall balance assessment:  Needs assistance Sitting-balance support: Feet supported;No upper extremity supported Sitting balance-Leahy Scale: Good     Standing balance support: During functional activity;Single extremity supported Standing balance-Leahy Scale: Poor Standing balance comment: needs steady assist                             Pertinent Vitals/Pain Pain Assessment: No/denies pain    Home Living Family/patient expects to be discharged to:: Assisted living                 Additional Comments: unsure    Prior Function           Comments: unsure     Hand Dominance   Dominant Hand: Right    Extremity/Trunk Assessment   Upper Extremity Assessment Upper Extremity Assessment: Generalized weakness    Lower Extremity Assessment Lower Extremity Assessment: Generalized weakness    Cervical / Trunk Assessment Cervical / Trunk Assessment: Normal  Communication   Communication: No difficulties  Cognition Arousal/Alertness: Awake/alert Behavior During Therapy: WFL for tasks assessed/performed Overall Cognitive Status: History of cognitive impairments - at baseline                                 General Comments: very pleasant , follows directions with extra time and cues      General  Comments      Exercises     Assessment/Plan    PT Assessment Patient needs continued PT services  PT Problem List Decreased strength;Decreased activity tolerance;Decreased balance;Decreased mobility;Decreased cognition       PT Treatment Interventions DME instruction;Gait training;Functional mobility training;Therapeutic activities;Patient/family education    PT Goals (Current goals can be found in the Care Plan section)  Acute Rehab PT Goals Patient Stated Goal[facility]. PT Goal Formulation: Patient unable to participate in goal setting Time For Goal Achievement: 10/30/18 Potential to Achieve Goals: Good    Frequency Min  2X/week   Barriers to discharge        Co-evaluation               AM-PAC PT "6 Clicks" Mobility  Outcome Measure Help needed turning from your back to your side while in a flat bed without using bedrails?: A Little Help needed moving from lying on your back to sitting on the side of a flat bed without using bedrails?: A Little Help needed moving to and from a bed to a chair (including a wheelchair)?: A Lot Help needed standing up from a chair using your arms (e.g., wheelchair or bedside chair)?: A Lot Help needed to walk in hospital room?: Total Help needed climbing 3-5 steps with a railing? : Total 6 Click Score: 12    End of Session   Activity Tolerance: Patient tolerated treatment well Patient left: in chair;with call bell/phone within reach;with chair alarm set Nurse Communication: Mobility status PT Visit Diagnosis: Unsteadiness on feet (R26.81)    Time: [id number] PT Time Calculation (min) (ACUTE ONLY): 20 min   Charges:   PT Evaluation $PT Eval Low Complexity: 1 Low          Blanchard KelchKaren Trinia Georgi PT Acute Rehabilitation Services Pager [phone] Office [phone]   Rada HayHill, Francheska Villeda Elizabeth 10/16/2018, 3:43 PM

## 2018-10-16 NOTE — Progress Notes (Signed)
PROGRESS NOTE    [name]  ZOX:[id number] DOB: Jul 21, 1929 DOA: 10/11/2018 PCP: Kristian Covey, MD   Brief Narrative:  84 year old with past medical history relevant for hypertension/hyperlipidemia coronary artery disease status post CABG, type 2 diabetes, subclavian artery stenosis, history of pulmonary embolism not on anticoagulation due to fall and hematoma, dementia, hypertension admitted [facility] with acute right lower extremity rash and found to have right lower extremity DVT.   Assessment & Plan:   Active Problems:   Rash   Leg swelling  #) Shortness of breath/dysphagia: Resolved.  Initially there was a thought that patient could have esophageal strictures which her sister reported history of however she also reported that she was no longer a candidate for further dilations due to her advanced age and dementia.  Her family is now decided to transition the patient to DNR and follow-up with palliative care.  They are also quite insistent on discontinuing 1 of her medications would not be beneficial [place] to her long-term quality of life.  As such please see below but multiple home medications are now being discontinued.  #) Right lower extremity rash: Have low suspicion that this represents cellulitis this patient has been quite broad coverage.  Suspect that her low-grade fever is likely secondary to DVT.  -Continue IV cefazolin started 10/14/2018 - Blood culture culture 10/11/2018- -Urine cultures 10/11/2018-  #) Right lower extremity DVT: Noted on ultrasound - Continue enoxaparin 1 mg/kg twice daily -Patient was discontinued on clopidogrel  #) Hypertension/hyperlipidemia coronary artery disease status post CABG: -Discontinue atorvastatin 20 mg nightly - Discontinue clopidogrel 75 mg daily -Discontinue metoprolol succinate 25 mg daily - Discontinue osartan 50 mg nightly  #) Type 2 diabetes on oral hypoglycemics: We will stop checking sliding  scale as her sugars have been relatively reassuring and the family does not want any additional testing unless absolutely necessary -Discontinue glimepiride 0.5 mg daily -Discontinue metformin thousand milligrams twice daily  #) Chronic lower extremity edema: -Hold bumetanide 0.5 mg daily  #) Dementia/pain/psych: -Continue quetiapine 12.5 mg nightly  #) Osteoporosis: -Hold bisphosphonate  Fluids: Tolerating p.o. Electrolytes: Monitor and supplement Nutrition: Per above  Prophylaxis: Treatment dose enoxaparin  Disposition: Pending 24 hours without sitter and back to skilled nursing facility  DNR   Consultants:   None  Procedures:  10/12/2018 echo:Left ventricle: The cavity size was normal. Systolic function was   normal. The estimated ejection fraction was in the range of 60%   to 65%. Wall motion was normal; there were no regional wall   motion abnormalities. Doppler parameters are consistent with   abnormal left ventricular relaxation (grade 1 diastolic   dysfunction). - Aortic valve: Transvalvular velocity was within the normal range.   There was no stenosis. There was no regurgitation. - Mitral valve: Transvalvular velocity was within the normal range.   There was no evidence for stenosis. There was trivial   regurgitation. - Right ventricle: The cavity size was normal. Wall thickness was   normal. Systolic function was normal. - Atrial septum: No defect or patent foramen ovale was identified   by color flow Doppler.  - Tricuspid valve: There was trivial regurgitation.  10/14/2018 lower extremity Doppler:Summary: Right: Findings consistent with acute deep vein thrombosis involving the right common femoral vein, and right femoral vein. No cystic structure found in the popliteal fossa. Left: There is no evidence of deep vein thrombosis in the lower extremity. However, portions of this examination were limited- see technologist comments above.  No cystic structure found  in the popliteal fossa.   Antimicrobials:  IV ceftriaxone 10/11/2018 to 10/12/2018  IV cefepime, metronidazole, vancomycin 10/12/2018 to 10/14/2018  IV cefazolin 10/14/2018 to ongoing   Subjective: This morning patient is feeling very well.  She denies any nausea, vomiting, diarrhea, cough, congestion, rhinorrhea.  She reports feeling well and does not have any complaints.  Objective: Vitals:   10/15/18 0603 10/15/18 1206 10/15/18 2128 10/16/18 0514  BP: (!) 141/61 (!) 115/54 (!) 131/59 (!) 137/59  Pulse: 64 69 63 66  Resp: 17 (!) 22 18 16   Temp: 98.4 F (36.9 C)  98.3 F (36.8 C) 98.2 F (36.8 C)  TempSrc: Oral  Oral Oral  SpO2: 96% 98% 96% 96%  Weight:      Height:        Intake/Output Summary (Last 24 hours) at 10/16/2018 1220 Last data filed at 10/16/2018 1015 Gross per 24 hour  Intake 735.49 ml  Output 300 ml  Net 435.49 ml   Filed Weights   10/12/18 0207  Weight: 58.2 kg    Examination:  General exam: Appears calm and comfortable  Respiratory system: Clear to auscultation. Respiratory effort normal. Cardiovascular system: Regular rate and rhythm, no murmurs Gastrointestinal system: Soft, nondistended, no rebound or guarding, plus bowel sounds. Central nervous system: Alert but not oriented, grossly moving all extremities Extremities: Right greater than left lower extremity swelling r. Skin: Right lower extremity with erythema, warmth, swelling Psychiatry: Unable to assess due to underlying dementia    Data Reviewed: I have personally reviewed following labs and imaging studies  CBC: Recent Labs  Lab 10/12/18 0224 10/13/18 0505 10/14/18 0439 10/15/18 0507 10/16/18 0512  WBC 15.6* 11.8* 8.8 6.0 6.3  NEUTROABS 13.7* 9.8* 6.4 3.9 4.0  HGB 11.2* 11.1* 10.4* 10.4* 10.2*  HCT 35.2* 34.3* 33.8* 33.3* 32.7*  MCV 99.4 95.5 98.8 98.5 98.8  PLT 129* 104* 122* 144* 167   Basic Metabolic Panel: Recent Labs  Lab 10/12/18 0224 10/13/18 0505  10/14/18 0439 10/15/18 0507 10/16/18 0512  NA 143 144 141 143 145  K 3.1* 3.5 3.5 3.7 3.8  CL 110 117* 114* 114* 115*  CO2 24 20* 22 22 25   GLUCOSE 213* 123* 110* 112* 120*  BUN 28* 18 14 8  6*  CREATININE 1.00 0.69 0.64 0.62 0.53  CALCIUM 8.4* 7.7* 7.5* 7.6* 8.0*  MG  --   --   --  2.1  --    GFR: Estimated Creatinine Clearance: 41 mL/min (by C-G formula based on SCr of 0.53 mg/dL). Liver Function Tests: No results for input(s): AST, ALT, ALKPHOS, BILITOT, PROT, ALBUMIN in the last 168 hours. No results for input(s): LIPASE, AMYLASE in the last 168 hours. No results for input(s): AMMONIA in the last 168 hours. Coagulation Profile: Recent Labs  Lab 10/12/18 0224  INR 1.08   Cardiac Enzymes: No results for input(s): CKTOTAL, CKMB, CKMBINDEX, TROPONINI in the last 168 hours. BNP (last 3 results) No results for input(s): PROBNP in the last 8760 hours. HbA1C: No results for input(s): HGBA1C in the last 72 hours. CBG: Recent Labs  Lab 10/14/18 1125 10/14/18 1652 10/14/18 2021 10/15/18 0731 10/15/18 1143  GLUCAP 141* 108* 120* 110* 122*   Lipid Profile: No results for input(s): CHOL, HDL, LDLCALC, TRIG, CHOLHDL, LDLDIRECT in the last 72 hours. Thyroid Function Tests: No results for input(s): TSH, T4TOTAL, FREET4, T3FREE, THYROIDAB in the last 72 hours. Anemia Panel: No results for input(s): VITAMINB12, FOLATE, FERRITIN, TIBC, IRON, RETICCTPCT  in the last 72 hours. Sepsis Labs: Recent Labs  Lab 10/11/18 2045  LATICACIDVEN 1.37    Recent Results (from the past 240 hour(s))  Blood culture (routine x 2)     Status: None (Preliminary result)   Collection Time: 10/11/18  8:35 PM  Result Value Ref Range Status   Specimen Description   Final    BLOOD RIGHT HAND Performed at [facility], [address], [place], [place] [address]    Special Requests   Final    BOTTLES DRAWN AEROBIC AND ANAEROBIC Blood Culture adequate volume Performed at [facility], [address] [place], [place] [address]    Culture   Final    NO GROWTH 3 DAYS Performed at [facility], [address] [place], [place] [address]    Report Status PENDING  Incomplete  Blood culture (routine x 2)     Status: None (Preliminary result)   Collection Time: 10/11/18  9:20 PM  Result Value Ref Range Status   Specimen Description   Final    BLOOD LEFT ARM Performed at [facility], [address], [place], [place] [address]    Special Requests   Final    BOTTLES DRAWN AEROBIC AND ANAEROBIC Blood Culture adequate volume Performed at [facility], [address] [place], [place] [address]    Culture   Final    NO GROWTH 3 DAYS Performed at [facility], [address], [place], [place] [address]    Report Status PENDING  Incomplete  Urine culture     Status: None   Collection Time: 10/11/18 10:13 PM  Result Value Ref Range Status   Specimen Description   Final    URINE, RANDOM Performed at [facility], [address] [place], [place] [address]    Special Requests   Final    NONE Performed at [facility], [address], [place], [place] [address]    Culture   Final    NO GROWTH Performed at [facility], [address], [place], [place] [address]    Report Status 10/13/2018 FINAL  Final  MRSA PCR Screening     Status: None   Collection Time: 10/12/18  3:27 AM  Result Value Ref Range Status   MRSA by PCR NEGATIVE NEGATIVE Final    Comment:        The GeneXpert MRSA Assay (FDA approved for NASAL specimens only), is one component of a comprehensive MRSA colonization surveillance program. It is not intended to diagnose MRSA infection nor to guide or monitor treatment for MRSA infections. Performed at [facility], [address], [place], [place] [address]          Radiology Studies: Dg Chest Port 1  View  Result Date: 10/15/2018 CLINICAL DATA:  Shortness of breath. EXAM: PORTABLE CHEST 1 VIEW COMPARISON:  Chest CT dated 10/12/2018 and chest x-ray dated 10/11/2018 FINDINGS: The heart size and mediastinal contours are within normal limits. Prominent left pericardial fat pad, unchanged. Aortic atherosclerosis. Stent in the region of the proximal left subclavian artery. Both lungs are clear. The visualized skeletal structures are unremarkable. IMPRESSION: No acute abnormalities. Aortic Atherosclerosis (ICD10-I70.0). Electronically Signed   By: Francene Boyers M.D.   On: 10/15/2018 13:37        Scheduled Meds: . atorvastatin  20 mg Oral QPM  . chlorhexidine  15 mL Mouth/Throat Daily  . enoxaparin (  LOVENOX) injection  1 mg/kg Subcutaneous Q12H  . famotidine  20 mg Oral Daily  . feeding supplement (ENSURE ENLIVE)  237 mL Oral BID BM  . QUEtiapine  12.5 mg Oral QHS   Continuous Infusions: . sodium chloride 10 mL/hr at 10/13/18 0856  .  ceFAZolin (ANCEF) IV 1 g (10/16/18 0532)     LOS: 4 days    Time spent: 35    Delaine Lame, MD [facility]   If 7PM-7AM, please contact night-coverage [email or web address] Password [email or web address] 10/16/2018, 12:20 PM

## 2018-10-17 LAB — CULTURE, BLOOD (ROUTINE X 2)
Culture: NO GROWTH
Culture: NO GROWTH
SPECIAL REQUESTS: ADEQUATE
Special Requests: ADEQUATE

## 2018-10-17 LAB — URINE CULTURE
Culture: <10000 — AB
Special Requests: NORMAL

## 2018-10-17 MED ORDER — ENOXAPARIN SODIUM 60 MG/0.6ML ~~LOC~~ SOLN: 1.0000 mg/kg | Freq: Two times a day (BID) | SUBCUTANEOUS | 0 refills | Status: DC

## 2018-10-17 MED ORDER — CEPHALEXIN 500 MG PO CAPS: 500.0000 mg | ORAL_CAPSULE | Freq: Four times a day (QID) | ORAL | 0 refills | Status: AC

## 2018-10-17 NOTE — NC FL2 (Signed)
[place] MEDICAID FL2 LEVEL OF CARE SCREENING TOOL     IDENTIFICATION  Patient Name: [name] Birthdate: Apr 25, 1929 Sex: female Admission Date (Current Location): 10/11/2018  [facility] and [place] Number:  Producer, television/film/video and Address:  [facility],  [address], [place] [address]      Provider Number: [id number]  Attending Physician Name and Address:  Delaine Lame, MD  Relative Name and Phone Number:       Current Level of Care: Hospital Recommended Level of Care: Assisted Living Facility Prior Approval Number:    Date Approved/Denied:   PASRR Number:    Discharge Plan: Other (Comment)(ALF)    Current Diagnoses: Patient Active Problem List   Diagnosis Date Noted  . Rash 10/12/2018  . Leg swelling 10/12/2018  . Unspecified vitamin D deficiency 02/18/2013  . Hand fracture 08/21/2012  . Hand pain 08/20/2012  . UTI (lower urinary tract infection) 10/27/2011  . Fall 10/27/2011  . Hyperlipidemia 07/30/2011  . Hypertension   . Subclavian arterial stenosis (HCC)   . Diabetes mellitus (HCC)   . GERD (gastroesophageal reflux disease)   . Hx of CABG   . Carotid artery disease (HCC)   . Aspirin intolerance   . CAD (coronary artery disease)   . ESOPHAGEAL STRICTURE 03/01/2010  . DYSPHAGIA UNSPECIFIED 03/01/2010  . SENILE DEMENTIA WITH DELUSIONAL FEATURES 09/16/2008  . PERIPHERAL VASCULAR DISEASE 12/18/2007  . BRONCHITIS, RECURRENT 12/18/2007  . NEPHROLITHIASIS 12/18/2007  . OSTEOPOROSIS 12/18/2007  . Nonspecific (abnormal) findings on radiological and other examination of body structure 12/18/2007  . ABNORMAL CHEST XRAY 12/18/2007  . ANXIETY 11/10/2007  . IBS 11/10/2007  . DEGENERATIVE JOINT DISEASE 11/10/2007    Orientation RESPIRATION BLADDER Height & Weight     Self  Normal Continent Weight: 128 lb 4.9 oz (58.2 kg) Height:  5\' 3"  (160 cm)  BEHAVIORAL SYMPTOMS/MOOD NEUROLOGICAL BOWEL NUTRITION STATUS      Continent  Diet(Dysphagia 3 diet )   Dysphagia 3 (Mech soft);Thin liquid- ground meats, extra gravy/sauces         AMBULATORY STATUS COMMUNICATION OF NEEDS Skin   Limited Assist Verbally Normal                       Personal Care Assistance Level of Assistance  Bathing, Feeding, Dressing Bathing Assistance: Limited assistance Feeding assistance: Independent Dressing Assistance: Limited assistance     Functional Limitations Info  Sight, Hearing, Speech Sight Info: Adequate Hearing Info: Adequate Speech Info: Adequate    SPECIAL CARE FACTORS FREQUENCY  (HHPT)     Liquid Administration via: Cup;No straw Medication Administration: Whole meds with puree Supervision: Full supervision/cueing for compensatory strategies Compensations: Slow rate;Small sips/bites;Follow solids with liquid Postural Changes: Seated upright at 90 degrees;Remain upright for at least 30 minutes after po intake                Contractures Contractures Info: Not present    Additional Factors Info  Code Status, Allergies Code Status Info: DNR Allergies Info:  Aspirin, Iodine, Irbesartan-hydrochlorothiazide, Penicillins           Current Medications (10/17/2018):  This is the current hospital active medication list Current Facility-Administered Medications  Medication Dose Route Frequency Provider Last Rate Last Dose  . 0.9 %  sodium chloride infusion   Intravenous PRN Briant Cedar, MD 10 mL/hr at 10/16/18 2119 1,000 mL at 10/16/18 2119  . acetaminophen (TYLENOL) tablet 650 mg  650 mg Oral Q6H PRN  Ike Bene, MD   650 mg at 10/16/18 2117  . ceFAZolin (ANCEF) IVPB 1 g/50 mL premix  1 g Intravenous Q8H Cindi Carbon, RPH 100 mL/hr at 10/17/18 1431 1 g at 10/17/18 1431  . chlorhexidine (PERIDEX) 0.12 % solution 15 mL  15 mL Mouth/Throat Daily Park, Bartholome Bill, MD   15 mL at 10/16/18 1304  . enoxaparin (LOVENOX) injection 60 mg  1 mg/kg Subcutaneous Q12H Herby Abraham, RPH   60 mg at  10/17/18 0502  . famotidine (PEPCID) tablet 20 mg  20 mg Oral Daily Briant Cedar, MD   20 mg at 10/17/18 1145  . feeding supplement (ENSURE ENLIVE) (ENSURE ENLIVE) liquid 237 mL  237 mL Oral BID BM Purohit, Shrey C, MD   237 mL at 10/17/18 1144  . ondansetron (ZOFRAN) injection 4 mg  4 mg Intravenous Q6H PRN Purohit, Shrey C, MD      . QUEtiapine (SEROQUEL) tablet 12.5 mg  12.5 mg Oral QHS Park, Bartholome Bill, MD   12.5 mg at 10/16/18 2117     Discharge Medications: STOP taking these medications   atorvastatin 20 MG tablet Commonly known as:  LIPITOR   clopidogrel 75 MG tablet Commonly known as:  PLAVIX   glimepiride 1 MG tablet Commonly known as:  AMARYL   ibandronate 150 MG tablet Commonly known as:  BONIVA   losartan 50 MG tablet Commonly known as:  COZAAR   metFORMIN 1000 MG tablet Commonly known as:  GLUCOPHAGE   vitamin B-12 1000 MCG tablet Commonly known as:  CYANOCOBALAMIN     TAKE these medications   acetaminophen 325 MG tablet Commonly known as:  TYLENOL Take 650 mg by mouth every 6 (six) hours as needed for mild pain.   aluminum-magnesium hydroxide-simethicone 200-200-20 MG/5ML Susp Commonly known as:  MAALOX Take 30 mLs by mouth every 6 (six) hours as needed (heartburn or indigestion).   bumetanide 0.5 MG tablet Commonly known as:  BUMEX Take 0.5 mg by mouth daily. What changed:  Another medication with the same name was removed. Continue taking this medication, and follow the directions you see here.   Calcium 600-200 MG-UNIT tablet Take 1 tablet by mouth 2 (two) times daily.   cephALEXin 500 MG capsule Commonly known as:  KEFLEX Take 1 capsule (500 mg total) by mouth 4 (four) times daily for 6 days.   chlorhexidine 0.12 % solution Commonly known as:  PERIDEX Use as directed 15 mLs in the mouth or throat daily.   Dextromethorphan-guaiFENesin 5-100 MG/5ML Liqd Take 5 mLs by mouth every 6 (six) hours as needed (cough).    enoxaparin 60 MG/0.6ML injection Commonly known as:  LOVENOX Inject 0.6 mLs (60 mg total) into the skin every 12 (twelve) hours.   famotidine 20 MG tablet Commonly known as:  PEPCID Take 20 mg by mouth daily.   guaifenesin 100 MG/5ML syrup Commonly known as:  ROBITUSSIN Take 200 mg by mouth 4 (four) times daily as needed for cough.   loperamide 2 MG capsule Commonly known as:  IMODIUM Take 2 mg by mouth daily as needed for diarrhea or loose stools.   magnesium hydroxide 400 MG/5ML suspension Commonly known as:  MILK OF MAGNESIA Take 30 mLs by mouth daily as needed for mild constipation.   metoprolol succinate 25 MG 24 hr tablet Commonly known as:  TOPROL-XL Take 25 mg by mouth daily.   QUEtiapine 25 MG tablet Commonly known as:  SEROQUEL Take 0.5 tablets (12.5 mg  total) by mouth at bedtime.    Relevant Imaging Results:  Relevant Lab Results:   Additional Information ssn#: [id number]  Coralyn HellingBernette Lamontae Ricardo, LCSW

## 2018-10-17 NOTE — Progress Notes (Signed)
Report called to Duwayne Heckanielle at [facility].

## 2018-10-17 NOTE — Progress Notes (Signed)
Pharmacy Antibiotic Note  [name] HawkingMildred K [name] is a 84 y.o. female admitted on 10/11/2018 with cellulitis. Patient was started on broad spectrum antibiotics on admission (cefepime, vancomycin, and metronidazole). Antibiotics are being de-escalated and pharmacy is consulted today to dose cefazolin.  Plan:  Cefazolin 1 g IV q8h  Follow renal function, last labs 12/10, WBC wnl, SCr 0.64  F/U culture results and sensitivities  Height: 5\' 3"  (160 cm) Weight: 128 lb 4.9 oz (58.2 kg) IBW/kg (Calculated) : 52.4  Temp (24hrs), Avg:98.1 F (36.7 C), Min:98 F (36.7 C), Max:98.2 F (36.8 C)  Recent Labs  Lab 10/11/18 2045 10/12/18 0224 10/13/18 0505 10/14/18 0439 10/15/18 0507 10/16/18 0512  WBC  --  15.6* 11.8* 8.8 6.0 6.3  CREATININE  --  1.00 0.69 0.64 0.62 0.53  LATICACIDVEN 1.37  --   --   --   --   --     Estimated Creatinine Clearance: 41 mL/min (by C-G formula based on SCr of 0.53 mg/dL).    Allergies  Allergen Reactions  . Aspirin     REACTION: pt states GI INTOL to aspirin  . Iodine     REACTION: GI UPSET  . Irbesartan-Hydrochlorothiazide     REACTION: GI UPSET  . Penicillins     REACTION: GI UPSET Has patient had a PCN reaction causing immediate rash, facial/tongue/throat swelling, SOB or lightheadedness with hypotension: No Has patient had a PCN reaction causing severe rash involving mucus membranes or skin necrosis: No Has patient had a PCN reaction that required hospitalization: No Has patient had a PCN reaction occurring within the last 10 years: No If all of the above answers are "NO", then may proceed with Cephalosporin use.   Antimicrobials this admission: 12/7 vanc >>  12/10 12/7 cefepime >>  12/10 12/7 metronidazole >> 12/10 12/10 cefazolin >>   Dose adjustments this admission:  Microbiology results: 12/7 BCx: ng-final 12/7 UCx: NGF  12/8 MRSA PCR: Negative 12/11 UCx: < 10K- final  Thank you for allowing pharmacy to be a part of this patient's  care.  Otho BellowsGreen, Caitlyn Buchanan L, PharmD 10/17/18 1:30 PM

## 2018-10-17 NOTE — Progress Notes (Signed)
ANTICOAGULATION CONSULT NOTE - Initial Consult  Pharmacy Consult for LMWH  Indication: DVT  Allergies  Allergen Reactions  . Aspirin     REACTION: pt states GI INTOL to aspirin  . Iodine     REACTION: GI UPSET  . Irbesartan-Hydrochlorothiazide     REACTION: GI UPSET  . Penicillins     REACTION: GI UPSET Has patient had a PCN reaction causing immediate rash, facial/tongue/throat swelling, SOB or lightheadedness with hypotension: No Has patient had a PCN reaction causing severe rash involving mucus membranes or skin necrosis: No Has patient had a PCN reaction that required hospitalization: No Has patient had a PCN reaction occurring within the last 10 years: No If all of the above answers are "NO", then may proceed with Cephalosporin use.   Patient Measurements: Height: 5\' 3"  (160 cm) Weight: 128 lb 4.9 oz (58.2 kg) IBW/kg (Calculated) : 52.4 Heparin Dosing Weight: 58.2 kg  Vital Signs: Temp: 98 F (36.7 C) (12/13 0606) Temp Source: Oral (12/13 0606) BP: 143/57 (12/13 0606) Pulse Rate: 59 (12/13 0606)  Labs: Recent Labs    10/15/18 0507 10/16/18 0512  HGB 10.4* 10.2*  HCT 33.3* 32.7*  PLT 144* 167  CREATININE 0.62 0.53   Estimated Creatinine Clearance: 41 mL/min (by C-G formula based on SCr of 0.53 mg/dL).  Medical History: Past Medical History:  Diagnosis Date  . Anxiety   . Aspirin intolerance   . Bronchitis, mucopurulent recurrent (HCC)   . CAD (coronary artery disease)    2008, question slight anteroapical ischemia  . Carotid artery disease (HCC)    Doppler, December, 2007, bilateral 0-39%  . Dementia (HCC)    Senile dementia with delusional features  . Diabetes mellitus   . DJD (degenerative joint disease)   . Ejection fraction    EF 70%, nuclear, 2007  . Esophageal stricture   . GERD (gastroesophageal reflux disease)   . Hematoma    Large hematoma from fall, November, 2009, Coumadin stopped  . Hemorrhoids   . Hiatal hernia   . Hx of CABG    2003, ... LIMA not used... small caliber  . Hyperlipidemia   . Hypertension   . IBS (irritable bowel syndrome)   . Nephrolithiasis   . Obesity   . Osteoporosis   . Palpitations   . Pulmonary embolism (HCC)   . Right knee injury   . Senile dementia with delusional features (HCC)   . SOB (shortness of breath)   . Subclavian arterial stenosis (HCC)    PCI  . Warfarin anticoagulation    stopped 09/2008 with large hematoma from fall    Medications:  Medications Prior to Admission  Medication Sig Dispense Refill Last Dose  . aluminum-magnesium hydroxide-simethicone (MAALOX) 200-200-20 MG/5ML SUSP Take 30 mLs by mouth every 6 (six) hours as needed (heartburn or indigestion).   unknown  . atorvastatin (LIPITOR) 20 MG tablet Take 20 mg by mouth every evening.    10/10/2018 at Unknown time  . bumetanide (BUMEX) 0.5 MG tablet Take 0.5 mg by mouth daily.   10/11/2018 at 0900  . Calcium 600-200 MG-UNIT tablet Take 1 tablet by mouth 2 (two) times daily.   10/11/2018 at 0900  . chlorhexidine (PERIDEX) 0.12 % solution Use as directed 15 mLs in the mouth or throat daily.    10/11/2018 at 0900  . clopidogrel (PLAVIX) 75 MG tablet TAKE 1 TABLET BY MOUTH EVERY DAY 30 tablet 6 10/11/2018 at 0900  . Dextromethorphan-guaiFENesin 5-100 MG/5ML LIQD Take 5 mLs by  mouth every 6 (six) hours as needed (cough).   unknown  . famotidine (PEPCID) 20 MG tablet Take 20 mg by mouth daily.   10/11/2018 at 0900  . glimepiride (AMARYL) 1 MG tablet Take 1/2 tablet by mouth once daily (Patient taking differently: Take 1 mg by mouth daily with breakfast. ) 30 tablet 5 10/11/2018 at 0900  . guaifenesin (ROBITUSSIN) 100 MG/5ML syrup Take 200 mg by mouth 4 (four) times daily as needed for cough.   unknown  . loperamide (IMODIUM) 2 MG capsule Take 2 mg by mouth daily as needed for diarrhea or loose stools.   unknown  . losartan (COZAAR) 50 MG tablet Take 50 mg by mouth every evening.    10/10/2018 at 2100  . magnesium hydroxide (MILK OF  MAGNESIA) 400 MG/5ML suspension Take 30 mLs by mouth daily as needed for mild constipation.   unknown  . metFORMIN (GLUCOPHAGE) 1000 MG tablet Take 1,000 mg by mouth 2 (two) times daily with a meal.   10/11/2018 at 0900  . metoprolol succinate (TOPROL-XL) 25 MG 24 hr tablet Take 25 mg by mouth daily.   10/11/2018 at 0900  . QUEtiapine (SEROQUEL) 25 MG tablet Take 0.5 tablets (12.5 mg total) by mouth at bedtime. 15 tablet 3 10/10/2018 at 2100  . vitamin B-12 (CYANOCOBALAMIN) 1000 MCG tablet Take 1,000 mcg by mouth daily.   10/11/2018 at 0900  . acetaminophen (TYLENOL) 325 MG tablet Take 650 mg by mouth every 6 (six) hours as needed for mild pain.   unknown  . bumetanide (BUMEX) 0.5 MG tablet Take 1 tablet (0.5 mg total) by mouth daily. (Patient not taking: Reported on 10/11/2018) 30 tablet 11 Completed Course at Unknown time  . ibandronate (BONIVA) 150 MG tablet Take 1 tablet (150 mg total) by mouth every 30 (thirty) days. Take in the morning with a full glass of water, on an empty stomach, and do not take anything else by mouth or lie down for the next 30 min. 1 tablet 11 unknown   Assessment: 84 yo W with acute DVT involving R common femoral vein and R femoral vein.  Pharmacy consulted to dose LMWH.   Hg 10.4, PLTC low at 122.  SCr WNL.   Goal of Therapy:  Monitor platelets by anticoagulation protocol: Yes   Plan:  LMWH 1 mg/kg sq q12 = 60 q12 H/H low, stable Anticipate continue Lovenox at discharge to facility  Otho BellowsGreen, Namira Rosekrans L PharmD Pager [phone] 10/17/2018, 1:30 PM

## 2018-10-17 NOTE — Progress Notes (Signed)
Report called to [facility], and transport has picked pt up.

## 2018-10-17 NOTE — Progress Notes (Signed)
PROGRESS NOTE    [name] HawkingMildred K Warr  JXB:[id number]:[id number] DOB: [birth year] DOA: 10/11/2018 PCP: Kristian CoveyBurchette, Bruce W, MD   Brief Narrative:  84 year old with past medical history relevant for hypertension/hyperlipidemia coronary artery disease status post CABG, type 2 diabetes, subclavian artery stenosis, history of pulmonary embolism not on anticoagulation due to fall and hematoma, dementia, hypertension admitted from [facility] with acute right lower extremity rash and found to have right lower extremity DVT.   Assessment & Plan:   Active Problems:   Rash   Leg swelling  #) Shortness of breath/dysphagia: Resolved.   #) Dementia/pain/psych: Family is decided to focus more on comfort care and discontinued nonnecessary medications. -Continue quetiapine 12.5 mg nightly  #) Right lower extremity rash: Have low suspicion that this represents cellulitis this patient has been quite broad coverage.  -Continue IV cefazolin started 10/14/2018 - Blood culture culture 10/11/2018- -Urine cultures 10/11/2018-  #) Right lower extremity DVT: Noted on ultrasound - Continue enoxaparin 1 mg/kg twice daily  #) Hypertension/hyperlipidemia coronary artery disease status post CABG: -Discontinue atorvastatin 20 mg nightly - Discontinue clopidogrel 75 mg daily -Discontinue metoprolol succinate 25 mg daily - Discontinue osartan 50 mg nightly  #) Type 2 diabetes on oral hypoglycemics: We will avoid checking sliding scale as family does not want additional testing is absolutely necessary. -Discontinue glimepiride 0.5 mg daily -Discontinue metformin thousand milligrams twice daily  #) Chronic lower extremity edema: -Hold bumetanide 0.5 mg daily   #) Osteoporosis: -Hold bisphosphonate  Fluids: Tolerating p.o. Electrolytes: Monitor and supplement Nutrition: Per above  Prophylaxis: Treatment dose enoxaparin  Disposition: Pending 24 hours without sitter and back to skilled nursing  facility  DNR   Consultants:   None  Procedures:  10/12/2018 echo:Left ventricle: The cavity size was normal. Systolic function was   normal. The estimated ejection fraction was in the range of 60%   to 65%. Wall motion was normal; there were no regional wall   motion abnormalities. Doppler parameters are consistent with   abnormal left ventricular relaxation (grade 1 diastolic   dysfunction). - Aortic valve: Transvalvular velocity was within the normal range.   There was no stenosis. There was no regurgitation. - Mitral valve: Transvalvular velocity was within the normal range.   There was no evidence for stenosis. There was trivial   regurgitation. - Right ventricle: The cavity size was normal. Wall thickness was   normal. Systolic function was normal. - Atrial septum: No defect or patent foramen ovale was identified   by color flow Doppler.  - Tricuspid valve: There was trivial regurgitation.  10/14/2018 lower extremity Doppler:Summary: Right: Findings consistent with acute deep vein thrombosis involving the right common femoral vein, and right femoral vein. No cystic structure found in the popliteal fossa. Left: There is no evidence of deep vein thrombosis in the lower extremity. However, portions of this examination were limited- see technologist comments above. No cystic structure found in the popliteal fossa.   Antimicrobials:  IV ceftriaxone 10/11/2018 to 10/12/2018  IV cefepime, metronidazole, vancomycin 10/12/2018 to 10/14/2018  IV cefazolin 10/14/2018 to ongoing   Subjective: This morning patient is lying in bed.  She denies any nausea, vomiting, diarrhea, cough, congestion, rhinorrhea.  She reports she is doing well.  Objective: Vitals:   10/16/18 0514 10/16/18 1318 10/16/18 2205 10/17/18 0606  BP: (!) 137/59 (!) 139/59 (!) 128/49 (!) 143/57  Pulse: 66 70 67 (!) 59  Resp: 16 17 18 18   Temp: 98.2 F (36.8 C) 99 F (37.2  C) 98.2 F (36.8 C) 98 F (36.7 C)   TempSrc: Oral Oral Oral Oral  SpO2: 96% 96% 98% 95%  Weight:      Height:        Intake/Output Summary (Last 24 hours) at 10/17/2018 1128 Last data filed at 10/17/2018 1021 Gross per 24 hour  Intake 534.51 ml  Output 750 ml  Net -215.49 ml   Filed Weights   10/12/18 0207  Weight: 58.2 kg    Examination:  General exam: Appears calm and comfortable  Respiratory system: Clear to auscultation. Respiratory effort normal. Cardiovascular system: Regular rate and rhythm, no murmurs Gastrointestinal system: Soft, nondistended, no rebound or guarding, plus bowel sounds. Central nervous system: Alert but not oriented, grossly moving all extremities Extremities: Right greater than left lower extremity swelling r. Skin: Right lower extremity with erythema, warmth, swelling Psychiatry: Unable to assess due to underlying dementia    Data Reviewed: I have personally reviewed following labs and imaging studies  CBC: Recent Labs  Lab 10/12/18 0224 10/13/18 0505 10/14/18 0439 10/15/18 0507 10/16/18 0512  WBC 15.6* 11.8* 8.8 6.0 6.3  NEUTROABS 13.7* 9.8* 6.4 3.9 4.0  HGB 11.2* 11.1* 10.4* 10.4* 10.2*  HCT 35.2* 34.3* 33.8* 33.3* 32.7*  MCV 99.4 95.5 98.8 98.5 98.8  PLT 129* 104* 122* 144* 167   Basic Metabolic Panel: Recent Labs  Lab 10/12/18 0224 10/13/18 0505 10/14/18 0439 10/15/18 0507 10/16/18 0512  NA 143 144 141 143 145  K 3.1* 3.5 3.5 3.7 3.8  CL 110 117* 114* 114* 115*  CO2 24 20* 22 22 25   GLUCOSE 213* 123* 110* 112* 120*  BUN 28* 18 14 8  6*  CREATININE 1.00 0.69 0.64 0.62 0.53  CALCIUM 8.4* 7.7* 7.5* 7.6* 8.0*  MG  --   --   --  2.1  --    GFR: Estimated Creatinine Clearance: 41 mL/min (by C-G formula based on SCr of 0.53 mg/dL). Liver Function Tests: No results for input(s): AST, ALT, ALKPHOS, BILITOT, PROT, ALBUMIN in the last 168 hours. No results for input(s): LIPASE, AMYLASE in the last 168 hours. No results for input(s): AMMONIA in the last 168  hours. Coagulation Profile: Recent Labs  Lab 10/12/18 0224  INR 1.08   Cardiac Enzymes: No results for input(s): CKTOTAL, CKMB, CKMBINDEX, TROPONINI in the last 168 hours. BNP (last 3 results) No results for input(s): PROBNP in the last 8760 hours. HbA1C: No results for input(s): HGBA1C in the last 72 hours. CBG: Recent Labs  Lab 10/14/18 1125 10/14/18 1652 10/14/18 2021 10/15/18 0731 10/15/18 1143  GLUCAP 141* 108* 120* 110* 122*   Lipid Profile: No results for input(s): CHOL, HDL, LDLCALC, TRIG, CHOLHDL, LDLDIRECT in the last 72 hours. Thyroid Function Tests: No results for input(s): TSH, T4TOTAL, FREET4, T3FREE, THYROIDAB in the last 72 hours. Anemia Panel: No results for input(s): VITAMINB12, FOLATE, FERRITIN, TIBC, IRON, RETICCTPCT in the last 72 hours. Sepsis Labs: Recent Labs  Lab 10/11/18 2045  LATICACIDVEN 1.37    Recent Results (from the past 240 hour(s))  Blood culture (routine x 2)     Status: None   Collection Time: 10/11/18  8:35 PM  Result Value Ref Range Status   Specimen Description   Final    BLOOD RIGHT HAND Performed at [facility], [address], [place], [place] [address]    Special Requests   Final    BOTTLES DRAWN AEROBIC AND ANAEROBIC Blood Culture adequate volume Performed at [facility],  [address], [place], [place] [address]    Culture   Final    NO GROWTH 5 DAYS Performed at [facility], [address] [place], [place] [address]    Report Status 10/17/2018 FINAL  Final  Blood culture (routine x 2)     Status: None   Collection Time: 10/11/18  9:20 PM  Result Value Ref Range Status   Specimen Description   Final    BLOOD LEFT ARM Performed at [facility], [address] [place], [place] [address]    Special Requests   Final    BOTTLES DRAWN AEROBIC AND ANAEROBIC Blood Culture adequate volume Performed at [facility], [address] [place], [place] [address]    Culture   Final    NO GROWTH 5 DAYS Performed at [facility], [address] [place], [place] [address]    Report Status 10/17/2018 FINAL  Final  Urine culture     Status: None   Collection Time: 10/11/18 10:13 PM  Result Value Ref Range Status   Specimen Description   Final    URINE, RANDOM Performed at [facility], [address] [place], [place] [address]    Special Requests   Final    NONE Performed at [facility], [address], [place], [place] [address]    Culture   Final    NO GROWTH Performed at [facility], [address], [place], [place] [address]    Report Status 10/13/2018 FINAL  Final  MRSA PCR Screening     Status: None   Collection Time: 10/12/18  3:27 AM  Result Value Ref Range Status   MRSA by PCR NEGATIVE NEGATIVE Final    Comment:        The GeneXpert MRSA Assay (FDA approved for NASAL specimens only), is one component of a comprehensive MRSA colonization surveillance program. It is not intended to diagnose MRSA infection nor to guide or monitor treatment for MRSA infections. Performed at [facility], [address] [place], [place] [address]   Culture, Urine     Status: Abnormal   Collection Time: 10/15/18  8:16 PM  Result Value Ref Range Status   Specimen Description   Final    URINE, CLEAN CATCH Performed at [facility], [address] [place], [place] [address]    Special Requests   Final    Normal Performed at [facility], [address] [place], [place] [address]    Culture (A)  Final    <10,000 COLONIES/mL INSIGNIFICANT GROWTH Performed at [facility], [address], [place], [place] [address]    Report Status 10/17/2018 FINAL  Final         Radiology Studies: Dg Chest Port 1 View  Result Date: 10/15/2018 CLINICAL DATA:  Shortness of breath. EXAM: PORTABLE CHEST 1 VIEW  COMPARISON:  Chest CT dated 10/12/2018 and chest x-ray dated 10/11/2018 FINDINGS: The heart size and mediastinal contours are within normal limits. Prominent left pericardial fat pad, unchanged. Aortic atherosclerosis. Stent in the region of the proximal left subclavian artery. Both lungs are clear. The visualized skeletal structures are unremarkable. IMPRESSION: No acute abnormalities. Aortic Atherosclerosis (ICD10-I70.0). Electronically Signed   By: Francene Boyers M.D.   On: 10/15/2018 13:37        Scheduled Meds: . chlorhexidine  15 mL Mouth/Throat Daily  . enoxaparin (LOVENOX) injection  1  mg/kg Subcutaneous Q12H  . famotidine  20 mg Oral Daily  . feeding supplement (ENSURE ENLIVE)  237 mL Oral BID BM  . QUEtiapine  12.5 mg Oral QHS   Continuous Infusions: . sodium chloride 1,000 mL (10/16/18 2119)  .  ceFAZolin (ANCEF) IV 1 g (10/17/18 0504)     LOS: 5 days    Time spent: 35    Delaine Lame, MD [facility]   If 7PM-7AM, please contact night-coverage [email or web address] Password [facility] 10/17/2018, 11:28 AM

## 2018-10-17 NOTE — Progress Notes (Signed)
Patient to [facility]  [facility]   Patient will transport by [facility].   RN report number: [id number]  Coralyn HellingBernette Sherilee Smotherman, Norberta KeensLSCW [place] CSW [phone]

## 2018-10-17 NOTE — Discharge Summary (Signed)
Physician Discharge Summary  [name] OIB:[id number] DOB: 01/19/1929 DOA: 10/11/2018  PCP: Eulas Post, MD  Admit date: 10/11/2018 Discharge date: 10/17/2018  Admitted From: ALF Disposition:  ALF  Recommendations for Outpatient Follow-up:  1. Follow up with PCP in 1-2 weeks 2. Please obtain BMP/CBC in one week 3. Please consult with palliative care at your assisted living facility  Home Health: No Equipment/Devices: None  Discharge Condition: Stable CODE STATUS: DO NOT RESUSCITATE Diet recommendation: Dysphagia 3 diet  Brief/Interim Summary:  #) Shortness of breath/dysphasia: On 10/15/2018 patient acutely became ill appearing, short of breath.  This was shortly after breakfast.  There is a thought she might of aspirated.  Chest x-ray was unremarkable.  Patient's vitals were otherwise reassuring.  She was noted to possibly have some upper airway obstruction.  Speech-language pathology was consulted recommend dysphagia 3 diet.  #) Right lower extremity rash/DVT: Patient was admitted with right lower extremity rash.  She was initially mildly febrile to 100.6.  She was thought to have met sepsis criteria and was started on broad-spectrum antibiotics.  She was noted to have right lower extremity DVT in that leg as well.  She was started on enoxaparin for prophylaxis.  Blood cultures and urine cultures were negative.  Patient was narrowed to IV cefazolin.  It was felt that most of patient's symptoms including the mild fever, erythema, swelling were likely related more to right lower extremity DVT rather than true cellulitis.  Patient was discharged to complete a course of 7 days of antibiotics.  She was started on enoxaparin twice daily and will continue this as an outpatient.  Echo showed no evidence of right heart strain.  #) Dementia with behavioral disturbance: Had extensive discussion with the patient's family including patient's healthcare power of attorney.  It was decided  that because of patient's underlying dementia that most many of her medications should be pared down to the bare minimum that improve her quality of life.  They decided to continue enoxaparin for at least 3 months.  They will take antibiotics for now.  They will follow-up with palliative care at her assisted living facility.  She was continued on her home quetiapine.  She was made DNR.  #) Hypertension/hyperlipidemia/coronary artery disease status post CABG: Patient's clopidogrel was discontinued due to enoxaparin.  Patient's metoprolol succinate can be continued.  Patient's ARB was discontinued.  Patient statin was discontinued.  #) Type 2 diabetes: Patient was initially maintained on sliding scale insulin here but this was discontinued once family expressed wishes to focus more on the patient's symptoms and stop active treatment.  Patient's glimepiride and metformin may be discontinued.  #) Chronic lower extremity edema: Patient may continue amantadine orally for symptoms.  #) Osteoporosis: Patient's bisphosphonate may be discontinued.  Discharge Diagnoses:  Active Problems:   Rash   Leg swelling    Discharge Instructions  Discharge Instructions    Call MD for:  difficulty breathing, headache or visual disturbances   Complete by:  As directed    Call MD for:  hives   Complete by:  As directed    Call MD for:  persistant nausea and vomiting   Complete by:  As directed    Call MD for:  redness, tenderness, or signs of infection (pain, swelling, redness, odor or green/yellow discharge around incision site)   Complete by:  As directed    Call MD for:  severe uncontrolled pain   Complete by:  As directed    Call  MD for:  temperature >100.4   Complete by:  As directed    Diet - low sodium heart healthy   Complete by:  As directed    Discharge instructions   Complete by:  As directed    Please complete your antibiotics as prescribed.   Increase activity slowly   Complete by:  As  directed      Allergies as of 10/17/2018      Reactions   Aspirin    REACTION: pt states GI INTOL to aspirin   Iodine    REACTION: GI UPSET   Irbesartan-hydrochlorothiazide    REACTION: GI UPSET   Penicillins    REACTION: GI UPSET Has patient had a PCN reaction causing immediate rash, facial/tongue/throat swelling, SOB or lightheadedness with hypotension: No Has patient had a PCN reaction causing severe rash involving mucus membranes or skin necrosis: No Has patient had a PCN reaction that required hospitalization: No Has patient had a PCN reaction occurring within the last 10 years: No If all of the above answers are "NO", then may proceed with Cephalosporin use.      Medication List    STOP taking these medications   atorvastatin 20 MG tablet Commonly known as:  LIPITOR   clopidogrel 75 MG tablet Commonly known as:  PLAVIX   glimepiride 1 MG tablet Commonly known as:  AMARYL   ibandronate 150 MG tablet Commonly known as:  BONIVA   losartan 50 MG tablet Commonly known as:  COZAAR   metFORMIN 1000 MG tablet Commonly known as:  GLUCOPHAGE   vitamin B-12 1000 MCG tablet Commonly known as:  CYANOCOBALAMIN     TAKE these medications   acetaminophen 325 MG tablet Commonly known as:  TYLENOL Take 650 mg by mouth every 6 (six) hours as needed for mild pain.   aluminum-magnesium hydroxide-simethicone 202-542-70 MG/5ML Susp Commonly known as:  MAALOX Take 30 mLs by mouth every 6 (six) hours as needed (heartburn or indigestion).   bumetanide 0.5 MG tablet Commonly known as:  BUMEX Take 0.5 mg by mouth daily. What changed:  Another medication with the same name was removed. Continue taking this medication, and follow the directions you see here.   Calcium 600-200 MG-UNIT tablet Take 1 tablet by mouth 2 (two) times daily.   cephALEXin 500 MG capsule Commonly known as:  KEFLEX Take 1 capsule (500 mg total) by mouth 4 (four) times daily for 6 days.   chlorhexidine  0.12 % solution Commonly known as:  PERIDEX Use as directed 15 mLs in the mouth or throat daily.   Dextromethorphan-guaiFENesin 5-100 MG/5ML Liqd Take 5 mLs by mouth every 6 (six) hours as needed (cough).   enoxaparin 60 MG/0.6ML injection Commonly known as:  LOVENOX Inject 0.6 mLs (60 mg total) into the skin every 12 (twelve) hours.   famotidine 20 MG tablet Commonly known as:  PEPCID Take 20 mg by mouth daily.   guaifenesin 100 MG/5ML syrup Commonly known as:  ROBITUSSIN Take 200 mg by mouth 4 (four) times daily as needed for cough.   loperamide 2 MG capsule Commonly known as:  IMODIUM Take 2 mg by mouth daily as needed for diarrhea or loose stools.   magnesium hydroxide 400 MG/5ML suspension Commonly known as:  MILK OF MAGNESIA Take 30 mLs by mouth daily as needed for mild constipation.   metoprolol succinate 25 MG 24 hr tablet Commonly known as:  TOPROL-XL Take 25 mg by mouth daily.   QUEtiapine 25 MG tablet Commonly known  as:  SEROQUEL Take 0.5 tablets (12.5 mg total) by mouth at bedtime.       Allergies  Allergen Reactions  . Aspirin     REACTION: pt states GI INTOL to aspirin  . Iodine     REACTION: GI UPSET  . Irbesartan-Hydrochlorothiazide     REACTION: GI UPSET  . Penicillins     REACTION: GI UPSET Has patient had a PCN reaction causing immediate rash, facial/tongue/throat swelling, SOB or lightheadedness with hypotension: No Has patient had a PCN reaction causing severe rash involving mucus membranes or skin necrosis: No Has patient had a PCN reaction that required hospitalization: No Has patient had a PCN reaction occurring within the last 10 years: No If all of the above answers are "NO", then may proceed with Cephalosporin use.    Consultations:  None   Procedures/Studies: Dg Chest 1 View  Result Date: 10/11/2018 CLINICAL DATA:  Pt BIB EMS from [facility] with complaints of right leg redness unknown when it began. Redness extends from  ankle to mid thigh. Hot to the touch. fever EXAM: CHEST  1 VIEW COMPARISON:  None. FINDINGS: Sternotomy wires overlie stable cardiac silhouette. Small LEFT effusion is increased. LEFT basilar atelectasis increased. RIGHT lung clear. IMPRESSION: LEFT lower lobe atelectasis and effusion. Cannot exclude LEFT lower lobe pneumonia. Electronically Signed   By: Suzy Bouchard M.D.   On: 10/11/2018 23:13   Dg Tibia/fibula Right  Result Date: 10/11/2018 CLINICAL DATA:  Pt BIB EMS from [facility] with complaints of right leg redness unknown when it began. Redness extends from ankle to mid thigh. Hot to the touch. redness to entire right leg; unknown if trauma EXAM: RIGHT TIBIA AND FIBULA - 2 VIEW COMPARISON:  None. FINDINGS: No fracture of the tibia or fibula. Knee joint and ankle joint appear normal on two views. IMPRESSION: No fracture or dislocation.  No osseous infection. Electronically Signed   By: Suzy Bouchard M.D.   On: 10/11/2018 23:17   Ct Angio Chest Pe W Or Wo Contrast  Result Date: 10/12/2018 CLINICAL DATA:  84 year old female with history of PE presenting with dyspnea. EXAM: CT ANGIOGRAPHY CHEST WITH CONTRAST TECHNIQUE: Multidetector CT imaging of the chest was performed using the standard protocol during bolus administration of intravenous contrast. Multiplanar CT image reconstructions and MIPs were obtained to evaluate the vascular anatomy. CONTRAST:  174m ISOVUE-370 IOPAMIDOL (ISOVUE-370) INJECTION 76% COMPARISON:  Chest radiograph dated 10/11/2018 FINDINGS: Evaluation is limited due to streak artifact caused by patient's arms. Cardiovascular: There is mild cardiomegaly. Multi vessel coronary vascular calcification. Moderate atherosclerotic calcification of the thoracic aorta. Left subclavian artery stent is suboptimally evaluated. Evaluation of the pulmonary arteries is limited due to respiratory motion artifact and suboptimal opacification of the peripheral branches. No large or central  pulmonary artery embolus identified. Mediastinum/Nodes: There is no hilar or mediastinal adenopathy. There is a small hiatal hernia. Esophagus is grossly unremarkable. No mediastinal fluid collection. Lungs/Pleura: There is an 8 mm ground-glass nodule in the right lower lobe (series 6, image 65). Minimal bibasilar linear atelectasis/scarring. There is no focal consolidation, pleural effusion, or pneumothorax. The central airways are patent. Upper Abdomen: Cholecystectomy. The visualized upper abdomen is otherwise unremarkable. Musculoskeletal: Osteopenia with degenerative changes of the spine. Minimal old appearing lower thoracic compression deformity. Median sternotomy wires. No acute osseous pathology. Review of the MIP images confirms the above findings. IMPRESSION: 1. No CT evidence of central pulmonary artery embolus. 2. A 8 mm ground-glass nodule in the right lower lobe.  Initial follow-up with CT at 6-12 months is recommended to confirm persistence. If persistent, repeat CT is recommended every 2 years until 5 years of stability has been established. This recommendation follows the consensus statement: Guidelines for Management of Incidental Pulmonary Nodules Detected on CT Images: From the Fleischner Society 2017; Radiology 2017; 284:228-243. Electronically Signed   By: Anner Crete M.D.   On: 10/12/2018 05:19   Dg Chest Port 1 View  Result Date: 10/15/2018 CLINICAL DATA:  Shortness of breath. EXAM: PORTABLE CHEST 1 VIEW COMPARISON:  Chest CT dated 10/12/2018 and chest x-ray dated 10/11/2018 FINDINGS: The heart size and mediastinal contours are within normal limits. Prominent left pericardial fat pad, unchanged. Aortic atherosclerosis. Stent in the region of the proximal left subclavian artery. Both lungs are clear. The visualized skeletal structures are unremarkable. IMPRESSION: No acute abnormalities. Aortic Atherosclerosis (ICD10-I70.0). Electronically Signed   By: Lorriane Shire M.D.   On:  10/15/2018 13:37   Dg Knee Complete 4 Views Right  Result Date: 10/11/2018 CLINICAL DATA:  Pt BIB EMS from [facility] with complaints of right leg redness unknown when it began. Redness extends from ankle to mid thigh. Hot to the touch. redness to entire right leg; unknown if trauma EXAM: RIGHT KNEE - COMPLETE 4+ VIEW COMPARISON:  None. FINDINGS: Is No fracture of the proximal tibia or distal femur. Patella is normal. No joint effusion. IMPRESSION: No osseous infection evident.  No effusion Electronically Signed   By: Suzy Bouchard M.D.   On: 10/11/2018 23:17   Vas [place] Lower Extremity Venous (dvt)  Result Date: 10/14/2018  Lower Venous Study Indications: Swelling, Edema, and Erythema.  Limitations: Body habitus and severe pain. Performing Technologist: Rudell Cobb  Examination Guidelines: A complete evaluation includes B-mode imaging, spectral Doppler, color Doppler, and power Doppler as needed of all accessible portions of each vessel. Bilateral testing is considered an integral part of a complete examination. Limited examinations for reoccurring indications may be performed as noted.  Right Venous Findings: +---------+---------------+---------+-----------+----------------+-------------+          CompressibilityPhasicitySpontaneityProperties      Summary       +---------+---------------+---------+-----------+----------------+-------------+ CFV      Partial                            partially       Acute                                                     re-cannalized                 +---------+---------------+---------+-----------+----------------+-------------+ SFJ      Partial                                            Acute         +---------+---------------+---------+-----------+----------------+-------------+ FV Prox  Partial                            partially       Acute  re-cannalized                  +---------+---------------+---------+-----------+----------------+-------------+ FV Mid   Full                                                             +---------+---------------+---------+-----------+----------------+-------------+ FV DistalFull                                                             +---------+---------------+---------+-----------+----------------+-------------+ PFV      Full                                                             +---------+---------------+---------+-----------+----------------+-------------+ POP      Full                                                             +---------+---------------+---------+-----------+----------------+-------------+ PTV                                                         not well                                                                  visualized    +---------+---------------+---------+-----------+----------------+-------------+ PERO                                                        not well                                                                  visualized    +---------+---------------+---------+-----------+----------------+-------------+  Left Venous Findings: +---------+---------------+---------+-----------+----------+------------------+          CompressibilityPhasicitySpontaneityPropertiesSummary            +---------+---------------+---------+-----------+----------+------------------+ CFV      Full           Yes      Yes                                     +---------+---------------+---------+-----------+----------+------------------+  SFJ      Full                                                            +---------+---------------+---------+-----------+----------+------------------+ FV Prox  Full                                                             +---------+---------------+---------+-----------+----------+------------------+ FV Mid   Full                                                            +---------+---------------+---------+-----------+----------+------------------+ FV DistalFull                                                            +---------+---------------+---------+-----------+----------+------------------+ PFV      Full                                                            +---------+---------------+---------+-----------+----------+------------------+ POP      Full           Yes      Yes                  very poor flow                                                           detected           +---------+---------------+---------+-----------+----------+------------------+ PTV                                                   not well                                                                 visualized         +---------+---------------+---------+-----------+----------+------------------+ PERO  not well                                                                 visualized         +---------+---------------+---------+-----------+----------+------------------+    Summary: Right: Findings consistent with acute deep vein thrombosis involving the right common femoral vein, and right femoral vein. No cystic structure found in the popliteal fossa. Left: There is no evidence of deep vein thrombosis in the lower extremity. However, portions of this examination were limited- see technologist comments above. No cystic structure found in the popliteal fossa.  *See table(s) above for measurements and observations. Electronically signed by Deitra Mayo MD on 10/14/2018 at 2:02:18 PM.    Final     10/12/2018 echo:Left ventricle: The cavity size was normal. Systolic function was normal. The estimated ejection fraction  was in the range of 60% to 65%. Wall motion was normal; there were no regional wall motion abnormalities. Doppler parameters are consistent with abnormal left ventricular relaxation (grade 1 diastolic dysfunction). - Aortic valve: Transvalvular velocity was within the normal range. There was no stenosis. There was no regurgitation. - Mitral valve: Transvalvular velocity was within the normal range. There was no evidence for stenosis. There was trivial regurgitation. - Right ventricle: The cavity size was normal. Wall thickness was normal. Systolic function was normal. - Atrial septum: No defect or patent foramen ovale was identified by color flow Doppler.  - Tricuspid valve: There was trivial regurgitation.   Subjective:   Discharge Exam: Vitals:   10/17/18 0606 10/17/18 1353  BP: (!) 143/57 132/87  Pulse: (!) 59 65  Resp: 18 15  Temp: 98 F (36.7 C) 98.3 F (36.8 C)  SpO2: 95% 91%   Vitals:   10/16/18 1318 10/16/18 2205 10/17/18 0606 10/17/18 1353  BP: (!) 139/59 (!) 128/49 (!) 143/57 132/87  Pulse: 70 67 (!) 59 65  Resp: _0 Temp: 99 F (37.2 C) 98.2 F (36.8 C) 98 F (36.7 C) 98.3 F (36.8 C)  TempSrc: Oral Oral Oral Oral  SpO2: 96% 98% 95% 91%  Weight:      Height:       General exam: Appears calm and comfortable  Respiratory system: Clear to auscultation. Respiratory effort normal. Cardiovascular system: Regular rate and rhythm, no murmurs Gastrointestinal system: Soft, nondistended, no rebound or guarding, plus bowel sounds. Central nervous system: Alert but not oriented, grossly moving all extremities Extremities: Right greater than left lower extremity swelling r. Skin: Right lower extremity with erythema, warmth, swelling Psychiatry: Unable to assess due to underlying dementia   The results of significant diagnostics from this hospitalization (including imaging, microbiology, ancillary and laboratory) are listed below for  reference.     Microbiology: Recent Results (from the past 240 hour(s))  Blood culture (routine x 2)     Status: None   Collection Time: 10/11/18  8:35 PM  Result Value Ref Range Status   Specimen Description   Final    BLOOD RIGHT HAND Performed at [facility], [place] [address] [place], [place] [address]    Special Requests   Final    BOTTLES DRAWN AEROBIC AND ANAEROBIC Blood Culture adequate volume Performed at [facility] [address] [place], [place]  [address]    Culture   Final    NO GROWTH 5 DAYS Performed at [facility], [place] [address] [place], [place] [address]    Report Status 10/17/2018 FINAL  Final  Blood culture (routine x 2)     Status: None   Collection Time: 10/11/18  9:20 PM  Result Value Ref Range Status   Specimen Description   Final    BLOOD LEFT ARM Performed at [facility] [address], [place], [place] [address]    Special Requests   Final    BOTTLES DRAWN AEROBIC AND ANAEROBIC Blood Culture adequate volume Performed at [facility] [address] [place], [place] [address]    Culture   Final    NO GROWTH 5 DAYS Performed at [facility], [place] [address] [place], [place] [address]    Report Status 10/17/2018 FINAL  Final  Urine culture     Status: None   Collection Time: 10/11/18 10:13 PM  Result Value Ref Range Status   Specimen Description   Final    URINE, RANDOM Performed at [facility] [address], [place], [place] [address]    Special Requests   Final    NONE Performed at [facility], [place] [address], [place], [place] [address]    Culture   Final    NO GROWTH Performed at [facility], [place] [address], [place], [place] [address]    Report Status 10/13/2018 FINAL  Final  MRSA PCR Screening     Status: None   Collection Time: 10/12/18  3:27 AM  Result Value Ref Range Status   MRSA by PCR  NEGATIVE NEGATIVE Final    Comment:        The GeneXpert MRSA Assay (FDA approved for NASAL specimens only), is one component of a comprehensive MRSA colonization surveillance program. It is not intended to diagnose MRSA infection nor to guide or monitor treatment for MRSA infections. Performed at [facility], [place] [address], [place], [place] [address]   Culture, Urine     Status: Abnormal   Collection Time: 10/15/18  8:16 PM  Result Value Ref Range Status   Specimen Description   Final    URINE, CLEAN CATCH Performed at [facility], [place] [address] [place], [place] [address]    Special Requests   Final    Normal Performed at [facility], [place] [address] [place], [place] [address]    Culture (A)  Final    <10,000 COLONIES/mL INSIGNIFICANT GROWTH Performed at [facility] [address], [place],  [address]    Report Status 10/17/2018 FINAL  Final     Labs: BNP (last 3 results) Recent Labs    03/12/18 1322 10/12/18 0224  BNP 90.2 938.1*   Basic Metabolic Panel: Recent Labs  Lab 10/12/18 0224 10/13/18 0505 10/14/18 0439 10/15/18 0507 10/16/18 0512  NA 143 144 141 143 145  K 3.1* 3.5 3.5 3.7 3.8  CL 110 117* 114* 114* 115*  CO2 24 20* _0 GLUCOSE 213* 123* 110* 112* 120*  BUN 28* _1 6*  CREATININE 1.00 0.69 0.64 0.62 0.53  CALCIUM 8.4* 7.7* 7.5* 7.6* 8.0*  MG  --   --   --  2.1  --    Liver Function Tests: No results for input(s): AST, ALT, ALKPHOS, BILITOT, PROT, ALBUMIN in the last 168  hours. No results for input(s): LIPASE, AMYLASE in the last 168 hours. No results for input(s): AMMONIA in the last 168 hours. CBC: Recent Labs  Lab 10/12/18 0224 10/13/18 0505 10/14/18 0439 10/15/18 0507 10/16/18 0512  WBC 15.6* 11.8* 8.8 6.0 6.3  NEUTROABS 13.7* 9.8* 6.4 3.9 4.0  HGB 11.2* 11.1* 10.4* 10.4* 10.2*  HCT 35.2* 34.3* 33.8* 33.3* 32.7*  MCV 99.4 95.5 98.8 98.5 98.8   PLT 129* 104* 122* 144* 167   Cardiac Enzymes: No results for input(s): CKTOTAL, CKMB, CKMBINDEX, TROPONINI in the last 168 hours. BNP: Invalid input(s): POCBNP CBG: Recent Labs  Lab 10/14/18 1125 10/14/18 1652 10/14/18 2021 10/15/18 0731 10/15/18 1143  GLUCAP 141* 108* 120* 110* 122*   D-Dimer No results for input(s): DDIMER in the last 72 hours. Hgb A1c No results for input(s): HGBA1C in the last 72 hours. Lipid Profile No results for input(s): CHOL, HDL, LDLCALC, TRIG, CHOLHDL, LDLDIRECT in the last 72 hours. Thyroid function studies No results for input(s): TSH, T4TOTAL, T3FREE, THYROIDAB in the last 72 hours.  Invalid input(s): FREET3 Anemia work up No results for input(s): VITAMINB12, FOLATE, FERRITIN, TIBC, IRON, RETICCTPCT in the last 72 hours. Urinalysis    Component Value Date/Time   COLORURINE YELLOW 10/11/2018 2213   APPEARANCEUR CLEAR 10/11/2018 2213   LABSPEC 1.027 10/11/2018 2213   PHURINE 6.0 10/11/2018 2213   GLUCOSEU NEGATIVE 10/11/2018 2213   GLUCOSEU NEGATIVE 01/16/2010 1636   HGBUR NEGATIVE 10/11/2018 2213   BILIRUBINUR NEGATIVE 10/11/2018 2213   KETONESUR 20 (A) 10/11/2018 2213   PROTEINUR 30 (A) 10/11/2018 2213   UROBILINOGEN 0.2 10/27/2011 1102   NITRITE NEGATIVE 10/11/2018 2213   LEUKOCYTESUR NEGATIVE 10/11/2018 2213   Sepsis Labs Invalid input(s): PROCALCITONIN,  WBC,  LACTICIDVEN Microbiology Recent Results (from the past 240 hour(s))  Blood culture (routine x 2)     Status: None   Collection Time: 10/11/18  8:35 PM  Result Value Ref Range Status   Specimen Description   Final    BLOOD RIGHT HAND Performed at [facility], [place] [address] [place], [place] [address]    Special Requests   Final    BOTTLES DRAWN AEROBIC AND ANAEROBIC Blood Culture adequate volume Performed at [facility] [address], [place], [place] [address]    Culture   Final    NO GROWTH 5 DAYS Performed at [facility], [place] [address] [place], [place] [address]    Report Status 10/17/2018 FINAL  Final  Blood culture (routine x 2)     Status: None   Collection Time: 10/11/18  9:20 PM  Result Value Ref Range Status   Specimen Description   Final    BLOOD LEFT ARM Performed at [facility] [address] [place], [place] [address]    Special Requests   Final    BOTTLES DRAWN AEROBIC AND ANAEROBIC Blood Culture adequate volume Performed at [facility] [address] [place], [place] [address]    Culture   Final    NO GROWTH 5 DAYS Performed at [facility], [place] [address], [place], [place] [address]    Report Status 10/17/2018 FINAL  Final  Urine culture     Status: None   Collection Time: 10/11/18 10:13 PM  Result Value Ref Range Status   Specimen Description   Final    URINE, RANDOM Performed at [facility] [address] [place], [place] [address]  Special Requests   Final    NONE Performed at [facility], [place] [address] [place], [place] [address]    Culture   Final    NO GROWTH Performed at [facility], [place] [address] [place], [place] [address]    Report Status 10/13/2018 FINAL  Final  MRSA PCR Screening     Status: None   Collection Time: 10/12/18  3:27 AM  Result Value Ref Range Status   MRSA by PCR NEGATIVE NEGATIVE Final    Comment:        The GeneXpert MRSA Assay (FDA approved for NASAL specimens only), is one component of a comprehensive MRSA colonization surveillance program. It is not intended to diagnose MRSA infection nor to guide or monitor treatment for MRSA infections. Performed at [facility], [place] [address] [place], [place] [address]   Culture, Urine     Status: Abnormal   Collection Time: 10/15/18  8:16 PM  Result Value Ref Range Status   Specimen Description   Final    URINE, CLEAN CATCH Performed at [facility], [place] [address], [place], [place] [address]    Special Requests   Final    Normal Performed at [facility], [place] [address], [place], [place] [address]    Culture (A)  Final    <10,000 COLONIES/mL INSIGNIFICANT GROWTH Performed at [facility] [address] [place], [place] [address]    Report Status 10/17/2018 FINAL  Final     Time coordinating discharge: 58  SIGNED:   Cristy Folks, MD  [facility] 10/17/2018, 2:40 PM  If 7PM-7AM, please contact night-coverage [email or web address] Password [email or web address]

## 2018-10-17 NOTE — Discharge Instructions (Signed)
Palliative Care Palliative care is the care of your body, mind, and spirit. Palliative care services are offered to people dealing with serious and life-threatening illnesses, often in the hospital or a long-term care setting. Palliative care requires a team of people who will help to ensure:  Pain and symptoms are controlled.  Family support.  Spiritual support.  Emotional and social support.  Comfort.  Palliative care is a way to bring comfort and peace of mind to a person and his or her family. It can also have a positive impact on the course of illness. Who can receive palliative care services? Palliative care is offered to children and adults when they are seriously ill and may not be responding well to treatment options. The person may be undergoing active treatment, such as chemotherapy, or may have stopped active treatment. Some people may have just been diagnosed with advanced disease or life-limiting illness. A health care provider will usually recommend palliative care services when more support would be helpful. What types of services are included in palliative care? Palliative care includes a team of health care providers and supporters who come together to help a person facing serious and life-threatening illness. The person's existing doctors are included in the care team, and supporters are added. Family members and friends may also receive palliative care services to cope with stress and other concerns. Services are different for each person and are based on the person's needs and preferences. The following people make up a palliative care team:  The person receiving care and his or her family.  Physicians, including primary health care providers and specialists.  Nurses.  A psychosocial worker.  Other people on the palliative care team may include:  A pain specialist and sometimes a hospice specialist.  A financial or insurance consultant.  Community resources,  such as school and spiritual organizations.  Religious leaders.  A care coordinator or case manager.  A bereavement coordinator.  The team will talk with the person and his or her family about:  The role of the pain specialist and the hospice specialist if this applies.  The person's active physical symptoms, such as pain, nausea, vomiting, and shortness of breath.  Stress, depression, and anxiety symptoms.  Function and mobility issues and how to stay as active as possible.  Treatment options and how they may affect life.  Spiritual wishes, such as rituals and prayer.  Legacy and memory making activities.  Life and death as a normal process.  Advance directives or living wills, health care proxies, and end-of-life care.  Any other concerns or issues.  The palliative care team will make it okay to talk about difficult issues and topics. Preferences and sensitive spiritual and emotional concerns are of high importance. Palliative care and hospice are somewhat different Palliative care and hospice care have similar goals of managing symptoms, promoting comfort, and maintaining dignity. However, hospice care is an option for people during the last 6 months of life expectancy. The palliative care team will coordinate the many support services provided during any phase of serious illness. This information is not intended to replace advice given to you by your health care provider. Make sure you discuss any questions you have with your health care provider. Document Released: 10/27/2013 Document Revised: 03/29/2016 Document Reviewed: 09/08/2013 Elsevier Interactive Patient Education  2018 [employer]  

## 2018-10-19 ENCOUNTER — Emergency Department (HOSPITAL_COMMUNITY): Payer: Medicare Other

## 2018-10-19 ENCOUNTER — Encounter (HOSPITAL_COMMUNITY): Payer: Self-pay | Admitting: Emergency Medicine

## 2018-10-19 ENCOUNTER — Other Ambulatory Visit: Payer: Self-pay

## 2018-10-19 ENCOUNTER — Emergency Department (HOSPITAL_COMMUNITY)
Admission: EM | Admit: 2018-10-19 | Discharge: 2018-10-19 | Disposition: A | Discharge status: Home / Self Care | Payer: Medicare Other | Attending: Emergency Medicine | Admitting: Emergency Medicine

## 2018-10-19 DIAGNOSIS — I2510 Atherosclerotic heart disease of native coronary artery without angina pectoris: Secondary | ICD-10-CM | POA: Insufficient documentation

## 2018-10-19 DIAGNOSIS — I10 Essential (primary) hypertension: Secondary | ICD-10-CM | POA: Diagnosis not present

## 2018-10-19 DIAGNOSIS — Z951 Presence of aortocoronary bypass graft: Secondary | ICD-10-CM | POA: Diagnosis not present

## 2018-10-19 DIAGNOSIS — Z7984 Long term (current) use of oral hypoglycemic drugs: Secondary | ICD-10-CM | POA: Insufficient documentation

## 2018-10-19 DIAGNOSIS — R911 Solitary pulmonary nodule: Secondary | ICD-10-CM | POA: Insufficient documentation

## 2018-10-19 DIAGNOSIS — M549 Dorsalgia, unspecified: Secondary | ICD-10-CM

## 2018-10-19 DIAGNOSIS — M545 Low back pain: Secondary | ICD-10-CM | POA: Diagnosis present

## 2018-10-19 DIAGNOSIS — E119 Type 2 diabetes mellitus without complications: Secondary | ICD-10-CM | POA: Insufficient documentation

## 2018-10-19 DIAGNOSIS — Y999 Unspecified external cause status: Secondary | ICD-10-CM | POA: Insufficient documentation

## 2018-10-19 DIAGNOSIS — Y939 Activity, unspecified: Secondary | ICD-10-CM | POA: Insufficient documentation

## 2018-10-19 DIAGNOSIS — S22000A Wedge compression fracture of unspecified thoracic vertebra, initial encounter for closed fracture: Secondary | ICD-10-CM | POA: Insufficient documentation

## 2018-10-19 DIAGNOSIS — I714 Abdominal aortic aneurysm, without rupture, unspecified: Secondary | ICD-10-CM

## 2018-10-19 DIAGNOSIS — Z7902 Long term (current) use of antithrombotics/antiplatelets: Secondary | ICD-10-CM | POA: Insufficient documentation

## 2018-10-19 DIAGNOSIS — X58XXXA Exposure to other specified factors, initial encounter: Secondary | ICD-10-CM | POA: Diagnosis not present

## 2018-10-19 DIAGNOSIS — Y929 Unspecified place or not applicable: Secondary | ICD-10-CM | POA: Insufficient documentation

## 2018-10-19 DIAGNOSIS — Z79899 Other long term (current) drug therapy: Secondary | ICD-10-CM | POA: Insufficient documentation

## 2018-10-19 DIAGNOSIS — F0390 Unspecified dementia without behavioral disturbance: Secondary | ICD-10-CM | POA: Diagnosis not present

## 2018-10-19 LAB — I-STAT TROPONIN, ED: Troponin i, poc: 0 ng/mL (ref 0.00–0.08)

## 2018-10-19 LAB — CBC WITH DIFFERENTIAL/PLATELET
Abs Immature Granulocytes: 0.04 10*3/uL (ref 0.00–0.07)
Basophils Absolute: 0 10*3/uL (ref 0.0–0.1)
Basophils Relative: 0 %
EOS PCT: 4 %
Eosinophils Absolute: 0.3 10*3/uL (ref 0.0–0.5)
HEMATOCRIT: 35.2 % — AB (ref 36.0–46.0)
HEMOGLOBIN: 11 g/dL — AB (ref 12.0–15.0)
Immature Granulocytes: 1 %
Lymphocytes Relative: 20 %
Lymphs Abs: 1.2 10*3/uL (ref 0.7–4.0)
MCH: 30.9 pg (ref 26.0–34.0)
MCHC: 31.3 g/dL (ref 30.0–36.0)
MCV: 98.9 fL (ref 80.0–100.0)
Monocytes Absolute: 0.5 10*3/uL (ref 0.1–1.0)
Monocytes Relative: 8 %
Neutro Abs: 4 10*3/uL (ref 1.7–7.7)
Neutrophils Relative %: 67 %
Platelets: 228 10*3/uL (ref 150–400)
RBC: 3.56 MIL/uL — ABNORMAL LOW (ref 3.87–5.11)
RDW: 13.4 % (ref 11.5–15.5)
WBC: 6 10*3/uL (ref 4.0–10.5)
nRBC: 0 % (ref 0.0–0.2)

## 2018-10-19 LAB — URINALYSIS, ROUTINE W REFLEX MICROSCOPIC
Bilirubin Urine: NEGATIVE
GLUCOSE, UA: NEGATIVE mg/dL
Hgb urine dipstick: NEGATIVE
Ketones, ur: 80 mg/dL — AB
Leukocytes, UA: NEGATIVE
Nitrite: NEGATIVE
PROTEIN: NEGATIVE mg/dL
Specific Gravity, Urine: >1.046 — ABNORMAL HIGH (ref 1.005–1.030)
pH: 5 (ref 5.0–8.0)

## 2018-10-19 LAB — COMPREHENSIVE METABOLIC PANEL
ALT: 59 U/L — ABNORMAL HIGH (ref 0–44)
AST: 119 U/L — ABNORMAL HIGH (ref 15–41)
Albumin: 2.8 g/dL — ABNORMAL LOW (ref 3.5–5.0)
Alkaline Phosphatase: 51 U/L (ref 38–126)
Anion gap: 9 (ref 5–15)
BUN: 6 mg/dL — ABNORMAL LOW (ref 8–23)
CO2: 26 mmol/L (ref 22–32)
CREATININE: 0.56 mg/dL (ref 0.44–1.00)
Calcium: 8.5 mg/dL — ABNORMAL LOW (ref 8.9–10.3)
Chloride: 109 mmol/L (ref 98–111)
GFR calc Af Amer: >60 mL/min (ref >60)
GFR calc non Af Amer: >60 mL/min (ref >60)
Glucose, Bld: 104 mg/dL — ABNORMAL HIGH (ref 70–99)
Potassium: 3.4 mmol/L — ABNORMAL LOW (ref 3.5–5.1)
Sodium: 144 mmol/L (ref 135–145)
Total Bilirubin: 0.8 mg/dL (ref 0.3–1.2)
Total Protein: 5.7 g/dL — ABNORMAL LOW (ref 6.5–8.1)

## 2018-10-19 MED ORDER — TRAMADOL HCL 50 MG PO TABS: 50.0000 mg | ORAL_TABLET | Freq: Four times a day (QID) | ORAL | 0 refills | Status: AC | PRN

## 2018-10-19 MED ORDER — IOPAMIDOL (ISOVUE-370) INJECTION 76%
INTRAVENOUS | Status: AC
  Filled 2018-10-19: qty 100

## 2018-10-19 MED ORDER — IOPAMIDOL (ISOVUE-370) INJECTION 76%
100.0000 mL | Freq: Once | INTRAVENOUS | Status: AC | PRN
  Administered 2018-10-19: 100 mL via INTRAVENOUS

## 2018-10-19 MED ORDER — MORPHINE SULFATE (PF) 4 MG/ML IV SOLN
4.0000 mg | Freq: Once | INTRAVENOUS | Status: AC
  Administered 2018-10-19: 4 mg via INTRAVENOUS
  Filled 2018-10-19: qty 1

## 2018-10-19 MED ORDER — SODIUM CHLORIDE (PF) 0.9 % IJ SOLN
INTRAMUSCULAR | Status: AC
  Filled 2018-10-19: qty 50

## 2018-10-19 MED ORDER — HYDRALAZINE HCL 20 MG/ML IJ SOLN
10.0000 mg | Freq: Once | INTRAMUSCULAR | Status: AC
  Administered 2018-10-19: 10 mg via INTRAVENOUS
  Filled 2018-10-19: qty 1

## 2018-10-19 NOTE — Discharge Instructions (Signed)
Continue tylenol as needed for pain.   Take tramadol 50 mg every 6 hrs as needed for severe pain.   You have compression fractures that are old.   You have AAA that is 3 cm that can be followed up in several years   See your doctor for follow up for your AAA and nodule   Return to ER if you have worse back pain, fever, vomiting, numbness, weakness

## 2018-10-19 NOTE — ED Notes (Signed)
Attempted to call report 4 times. Facility kept hanging up.  Got [facility] on 5th attempt. Gave report.

## 2018-10-19 NOTE — ED Provider Notes (Signed)
[facility]-EMERGENCY DEPT Provider Note   CSN: [id number] Arrival date & time: 10/19/18  7829     History   Chief Complaint Chief Complaint  Patient presents with  . Back Pain    HPI [name] is a 84 y.o. female history of CAD, diabetes, recent admission for sepsis secondary to UTI still on Keflex, who presented with back pain.  Patient is back to [facility] and was discharged there 2 days ago.  Patient is very demented unable to give me much history.  Per the transfer paperwork, patient has some lower back pain.  Patient states everywhere hurts especially in the low back but denies any trauma or falls.  Patient is demented at baseline.  No reported fever at the facility.  The history is provided by the patient and the EMS personnel. The history is limited by the condition of the patient.   Level V caveat- dementia   Past Medical History:  Diagnosis Date  . Anxiety   . Aspirin intolerance   . Bronchitis, mucopurulent recurrent (HCC)   . CAD (coronary artery disease)    2008, question slight anteroapical ischemia  . Carotid artery disease (HCC)    Doppler, December, 2007, bilateral 0-39%  . Dementia (HCC)    Senile dementia with delusional features  . Diabetes mellitus   . DJD (degenerative joint disease)   . Ejection fraction    EF 70%, nuclear, 2007  . Esophageal stricture   . GERD (gastroesophageal reflux disease)   . Hematoma    Large hematoma from fall, November, 2009, Coumadin stopped  . Hemorrhoids   . Hiatal hernia   . Hx of CABG    2003, ... LIMA not used... small caliber  . Hyperlipidemia   . Hypertension   . IBS (irritable bowel syndrome)   . Nephrolithiasis   . Obesity   . Osteoporosis   . Palpitations   . Pulmonary embolism (HCC)   . Right knee injury   . Senile dementia with delusional features (HCC)   . SOB (shortness of breath)   . Subclavian arterial stenosis (HCC)    PCI  . Warfarin anticoagulation    stopped 09/2008 with large hematoma from fall    Patient Active Problem List   Diagnosis Date Noted  . Rash 10/12/2018  . Leg swelling 10/12/2018  . Unspecified vitamin D deficiency 02/18/2013  . Hand fracture 08/21/2012  . Hand pain 08/20/2012  . UTI (lower urinary tract infection) 10/27/2011  . Fall 10/27/2011  . Hyperlipidemia 07/30/2011  . Hypertension   . Subclavian arterial stenosis (HCC)   . Diabetes mellitus (HCC)   . GERD (gastroesophageal reflux disease)   . Hx of CABG   . Carotid artery disease (HCC)   . Aspirin intolerance   . CAD (coronary artery disease)   . ESOPHAGEAL STRICTURE 03/01/2010  . DYSPHAGIA UNSPECIFIED 03/01/2010  . SENILE DEMENTIA WITH DELUSIONAL FEATURES 09/16/2008  . PERIPHERAL VASCULAR DISEASE 12/18/2007  . BRONCHITIS, RECURRENT 12/18/2007  . NEPHROLITHIASIS 12/18/2007  . OSTEOPOROSIS 12/18/2007  . Nonspecific (abnormal) findings on radiological and other examination of body structure 12/18/2007  . ABNORMAL CHEST XRAY 12/18/2007  . ANXIETY 11/10/2007  . IBS 11/10/2007  . DEGENERATIVE JOINT DISEASE 11/10/2007    Past Surgical History:  Procedure Laterality Date  . ABDOMINAL HYSTERECTOMY    . APPENDECTOMY    . cabg  1 vessel w/SVG to LAD  2003  . CATARACT EXTRACTION    . FRACTURE SURGERY    .  HEMORRHOID SURGERY    . mult orthopedic procedures     Dr. Arnette Norris arm, CTS,cyst removal  . open heart surgery       OB History   No obstetric history on file.      Home Medications    Prior to Admission medications   Medication Sig Start Date End Date Taking? Authorizing Provider  acetaminophen (TYLENOL) 325 MG tablet Take 325 mg by mouth every 6 (six) hours as needed for mild pain.    Yes [provider]  aluminum-magnesium hydroxide-simethicone (MAALOX) 200-200-20 MG/5ML SUSP Take 30 mLs by mouth every 6 (six) hours as needed (heartburn or indigestion).   Yes [provider]  bumetanide (BUMEX) 0.5 MG tablet Take 0.5  mg by mouth daily.   Yes [provider]  Calcium Carbonate-Vitamin D (CALCIUM 600+D) 600-400 MG-UNIT tablet Take 1 tablet by mouth 2 (two) times daily.   Yes [provider]  cephALEXin (KEFLEX) 500 MG capsule Take 1 capsule (500 mg total) by mouth 4 (four) times daily for 6 days. 10/17/18 10/23/18 Yes Purohit, Salli Quarry, MD  chlorhexidine (PERIDEX) 0.12 % solution Use as directed 15 mLs in the mouth or throat daily.    Yes [provider]  clopidogrel (PLAVIX) 75 MG tablet Take 75 mg by mouth daily.   Yes [provider]  enoxaparin (LOVENOX) 60 MG/0.6ML injection Inject 0.6 mLs (60 mg total) into the skin every 12 (twelve) hours. 10/17/18 11/16/18 Yes Purohit, Salli Quarry, MD  glimepiride (AMARYL) 1 MG tablet Take 1 mg by mouth daily with breakfast.   Yes [provider]  losartan (COZAAR) 50 MG tablet Take 50 mg by mouth daily.   Yes [provider]  magnesium hydroxide (MILK OF MAGNESIA) 400 MG/5ML suspension Take 30 mLs by mouth daily as needed for mild constipation.   Yes [provider]  metoprolol succinate (TOPROL-XL) 25 MG 24 hr tablet Take 25 mg by mouth daily.   Yes [provider]  QUEtiapine (SEROQUEL) 25 MG tablet Take 0.5 tablets (12.5 mg total) by mouth at bedtime. 12/25/12  Yes Michele Mcalpine, MD  vitamin B-12 (CYANOCOBALAMIN) 1000 MCG tablet Take 1,000 mcg by mouth daily.   Yes [provider]  famotidine (PEPCID) 20 MG tablet Take 20 mg by mouth daily.    [provider]    Family History Family History  Problem Relation Age of Onset  . Stroke Father   . Cancer Mother        bladder cancer  . Hypertension Mother   . Stroke Mother   . Leukemia Sister   . Colon cancer Neg Hx     Social History Social History   Tobacco Use  . Smoking status: Never Smoker  . Smokeless tobacco: Never Used  Substance Use Topics  . Alcohol use: No  . Drug use: No     Allergies   Aspirin; Iodine;  Irbesartan-hydrochlorothiazide; and Penicillins   Review of Systems Review of Systems  Musculoskeletal: Positive for back pain.  All other systems reviewed and are negative.    Physical Exam Updated Vital Signs BP (!) 119/49   Pulse 66   Temp 98.2 F (36.8 C) (Oral)   Resp 17   Ht 5\' 3"  (1.6 m)   Wt 58.2 kg   SpO2 96%   BMI 22.73 kg/m   Physical Exam Vitals signs and nursing note reviewed.  Constitutional:      Comments: Demented, NAD   HENT:  Head: Normocephalic.     Right Ear: Tympanic membrane normal.     Left Ear: Tympanic membrane normal.     Nose: Nose normal.     Mouth/Throat:     Mouth: Mucous membranes are moist.  Eyes:     Extraocular Movements: Extraocular movements intact.     Pupils: Pupils are equal, round, and reactive to light.  Neck:     Musculoskeletal: Normal range of motion.  Cardiovascular:     Rate and Rhythm: Normal rate and regular rhythm.  Pulmonary:     Effort: Pulmonary effort is normal.     Breath sounds: Normal breath sounds.  Abdominal:     General: Abdomen is flat.     Palpations: Abdomen is soft.     Comments: ? Pulsatile mass   Musculoskeletal:     Comments: Mild lower lumbar tenderness, no obvious deformity. Normal pedal pulses bilaterally, nl ROM bilateral hips   Skin:    General: Skin is warm.     Capillary Refill: Capillary refill takes less than 2 seconds.  Neurological:     General: No focal deficit present.     Mental Status: She is alert.  Psychiatric:        Mood and Affect: Mood normal.      ED Treatments / Results  Labs (all labs ordered are listed, but only abnormal results are displayed) Labs Reviewed  CBC WITH DIFFERENTIAL/PLATELET - Abnormal; Notable for the following components:      Result Value   RBC 3.56 (*)    Hemoglobin 11.0 (*)    HCT 35.2 (*)    All other components within normal limits  COMPREHENSIVE METABOLIC PANEL - Abnormal; Notable for the following components:   Potassium 3.4 (*)     Glucose, Bld 104 (*)    BUN 6 (*)    Calcium 8.5 (*)    Total Protein 5.7 (*)    Albumin 2.8 (*)    AST 119 (*)    ALT 59 (*)    All other components within normal limits  URINALYSIS, ROUTINE W REFLEX MICROSCOPIC  I-STAT TROPONIN, ED    EKG EKG Interpretation  Date/Time:  Sunday October 19 2018 10:12:23 EST Ventricular Rate:  62 PR Interval:    QRS Duration: 110 QT Interval:  452 QTC Calculation: 459 R Axis:   -166 Text Interpretation:  Sinus rhythm Anterior infarct, old No significant change since last tracing Confirmed by Richardean Canal [phone]) on 10/19/2018 10:50:41 AM Also confirmed by Richardean Canal [phone]), editor Sheppard Evens ([id number])  on 10/19/2018 12:17:53 PM   Radiology Dg Chest 1 View  Result Date: 10/19/2018 CLINICAL DATA:  Weakness for 2 days, diabetes mellitus, hypertension, coronary artery disease EXAM: CHEST  1 VIEW COMPARISON:  Portable exam 1105 hours compared to 10/15/2018 FINDINGS: Upper normal heart size post median sternotomy and CABG. Stent identified at the LEFT superior mediastinum. Atherosclerotic calcification aorta. Mediastinal contours and pulmonary vascularity normal. Lungs clear. No infiltrate, pleural effusion, or pneumothorax. Scattered costal cartilaginous calcifications and osseous demineralization noted. IMPRESSION: No acute abnormalities. Electronically Signed   By: Ulyses Southward M.D.   On: 10/19/2018 11:23   Ct L-spine No Charge  Result Date: 10/19/2018 CLINICAL DATA:  Low back pain.  No reported acute injury. EXAM: CT Lumbar Spine without contrast TECHNIQUE: Technique: Multiplanar CT images of the lumbar spine were reconstructed from contemporary CT of the Abdomen and Pelvis. COMPARISON:  Chest CT 10/12/2018.  Lumbar radiographs 01/10/2013. FINDINGS: Segmentation: Standard. Alignment:  Mild convex right thoracolumbar scoliosis. The lateral alignment is normal. Vertebrae: There are superior endplate compression fractures at T11 and T12 which are  unchanged from the chest CT of last week and similar to prior lumbar spine radiographs. No definite acute fracture or traumatic subluxation. Paraspinal and other soft tissues: No acute paraspinal findings. There is aortic and branch vessel atherosclerosis with an anterior saccular aneurysm of the mid aorta, further described on separate examination of the abdomen and pelvis. Disc levels: L1-2: Chronic degenerative disc disease with annular disc bulging and endplate osteophytes asymmetric to the left. Mild foraminal narrowing bilaterally without nerve root encroachment. No significant disc space findings at L2-3 or L3-4 for age. L4-5: Mild disc bulging, facet and ligamentous hypertrophy. Mild spinal stenosis. No nerve root encroachment. L5-S1: Mild disc bulging and facet hypertrophy. No significant spinal stenosis or nerve root encroachment. IMPRESSION: 1. No acute lumbar spine findings. 2. Stable T11 and T12 compression deformities from chest CT of last week. These appear chronic. 3. Mild lumbar spondylosis without high-grade spinal stenosis or nerve root encroachment. 4. 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 See separate abdominopelvic CT report. Electronically Signed   By: Carey BullocksWilliam  Veazey M.D.   On: 10/19/2018 11:43   Ct Renal Stone Study  Result Date: 10/19/2018 CLINICAL DATA:  Low back pain.  No reported acute injury. EXAM: CT ABDOMEN AND PELVIS WITHOUT CONTRAST TECHNIQUE: Multidetector CT imaging of the abdomen and pelvis was performed following the standard protocol without IV contrast. COMPARISON:  CT urogram 08/14/2004 (no report). FINDINGS: Lower chest: Minimal central airway thickening, bronchiectasis and scarring at both lung bases. No significant pleural or pericardial effusion. Previous median sternotomy. There is atherosclerosis of the aorta and coronary arteries. There is a moderate size hiatal hernia. Hepatobiliary: The liver appears unremarkable as imaged in the noncontrast state. Mild  extrahepatic biliary dilatation appears similar to previous study, within physiologic limits post cholecystectomy. Pancreas: Unremarkable. No pancreatic ductal dilatation or surrounding inflammatory changes. Spleen: Normal in size without focal abnormality. Adrenals/Urinary Tract: Both adrenal glands appear normal. Both kidneys demonstrate mild cortical thinning. There are small renovascular calcifications bilaterally, but no evidence of urinary tract calculus, hydronephrosis or perinephric soft tissue stranding. The bladder appears unremarkable. Stomach/Bowel: No evidence of bowel wall thickening, distention or surrounding inflammatory change. Probable small appendiceal stump. There are mild diverticular changes in the sigmoid colon. There is mildly prominent stool in the rectum. Vascular/Lymphatic: There are no enlarged abdominal or pelvic lymph nodes. Diffuse aortic and branch vessel atherosclerosis. There is a new focal saccular aneurysm projecting anteriorly from the mid abdominal aorta. This is most obvious on sagittal image 87/5. The AP diameter of the aorta is 2.9 cm. No evidence of retroperitoneal hematoma. Reproductive: Hysterectomy. No evidence of adnexal mass. Unopacified small bowel noted adjacent to the cervix. Other: No ascites or free air. Subcutaneous emphysema in the lower anterior abdominal wall bilaterally, attributed to subcutaneous injections. Musculoskeletal: No acute or significant osseous findings. Lumbar spine findings are dictated separately. There are superior endplate compression deformities at T11 and T12 which appear similar to lumbar radiographs 01/2013 and chest CT 10/12/2018. Mild scoliosis and spondylosis noted. IMPRESSION: 1. 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 No evidence of retroperitoneal hematoma. This is potentially symptomatic. Consider vascular surgery consultation and CTA follow-up. 2. No evidence of urinary tract  calculus or hydronephrosis. 3. No other acute abdominal findings. 4.  Aortic Atherosclerosis (ICD10-I70.0). Electronically Signed   By: Hilarie FredricksonWilliam  Veazey M.D.  On: 10/19/2018 11:35   Ct Angio Abd/pel W And/or Wo Contrast  Result Date: 10/19/2018 CLINICAL DATA:  Abdominal pain. Saccular aortic aneurysm on noncontrast CT. EXAM: CTA ABDOMEN AND PELVIS WITHOUT AND WITH CONTRAST TECHNIQUE: Multidetector CT imaging of the abdomen and pelvis was performed using the standard protocol during bolus administration of intravenous contrast. Multiplanar reconstructed images and MIPs were obtained and reviewed to evaluate the vascular anatomy. CONTRAST:  ISOVUE-370 IOPAMIDOL (ISOVUE-370) INJECTION 76% COMPARISON:  Noncontrast CT earlier the same day. Chest CT 10/12/2018. FINDINGS: In addition to the ordered abdominal CTA, the technologist mistakenly performed a chest CTA for which the patient was not charged. The findings for this portion of the study are as follows: Cardiovascular: The pulmonary arteries are well opacified with contrast. No evidence of acute pulmonary embolism. Status post median sternotomy and CABG. There is a left subclavian arterial stent which appears patent. There is atherosclerosis of the aorta, great vessels and coronary arteries. No acute vascular findings are seen. The heart is mildly enlarged. No significant pericardial effusion. Mediastinum/Nodes: There are no enlarged mediastinal, hilar or axillary lymph nodes. Stable mild thyroid nodularity and a moderate-size hiatal hernia. Lungs/Pleura: There are trace bilateral pleural effusions. No pneumothorax. Mild emphysema and mild left apical scarring are stable. 8 x 5 mm right lower lobe solid nodule on image 58/6 is unchanged from recent chest CT. No confluent airspace opacity. Musculoskeletal/Chest wall: There is no chest wall mass or suspicious osseous finding. Stable compression deformities in the lower thoracic spine. VASCULAR Aorta: As seen  on earlier noncontrast study, there is diffuse aortic atherosclerosis with a saccular aneurysm projecting anteriorly from the mid abdominal aorta. There is a small amount of mural thrombus associated with this aneurysm. The maximal AP diameter of the aorta at this level is 3.0 cm. No evidence of leak. Celiac: Mild ostial stenosis without occlusion or aneurysm. SMA: Patent. Renals: Ostial calcifications bilaterally. Small accessory artery to the upper pole of the right kidney. No evidence of significant luminal narrowing. IMA: Patent. Inflow: Mild bilateral iliac atherosclerosis without aneurysm or stenosis. Proximal Outflow: Patent. Veins: Limited opacification without demonstrated abnormality. Review of the MIP images confirms the above findings. NON-VASCULAR Hepatobiliary: No focal hepatic abnormality or abnormal enhancement. Stable mild extrahepatic biliary dilatation post cholecystectomy) Pancreas: Unremarkable. No evidence of pancreatic mass, ductal dilatation or surrounding inflammation. Spleen: Normal in size without focal abnormality. Adrenals/Urinary Tract: Both adrenal glands appear normal. No evidence of renal mass or hydronephrosis. The bladder appears unremarkable. Stomach/Bowel: No acute findings.  Sigmoid diverticular changes. Lymphatic: No enlarged abdominopelvic lymph nodes. Reproductive: Hysterectomy.  No adnexal mass. Other: Small amount of free pelvic fluid. No generalized ascites or focal extraluminal fluid collection. Musculoskeletal: No acute osseous findings. IMPRESSION: VASCULAR 1. 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 No evidence of aneurysm leak. 2. Aortic and branch vessel atherosclerosis as described without evidence of large vessel occlusion. Mild ostial stenosis of the celiac trunk. NON-VASCULAR 1. No new significant findings identified in the abdomen or pelvis. 2. Small amount of free pelvic fluid. 3. Redemonstrated  is a solid right lower lobe pulmonary nodule which is unchanged from the recent chest CT of last week. If the patient is at high risk for bronchogenic carcinoma, follow-up chest CT at 6-12 months is recommended. If the patient is at low risk for bronchogenic carcinoma, follow-up chest CT at 12 months is recommended. This recommendation follows the consensus statement: Guidelines for Management  of Small Pulmonary Nodules Detected on CT Scans: A Statement from the Fleischner Society as published in Radiology 2005;237:395-400. Electronically Signed   By: Carey Bullocks M.D.   On: 10/19/2018 14:24    Procedures Procedures (including critical care time)  Medications Ordered in ED Medications  sodium chloride (PF) 0.9 % injection (has no administration in time range)  iopamidol (ISOVUE-370) 76 % injection (has no administration in time range)  sodium chloride (PF) 0.9 % injection (has no administration in time range)  iopamidol (ISOVUE-370) 76 % injection (has no administration in time range)  morphine 4 MG/ML injection 4 mg (4 mg Intravenous Given 10/19/18 1118)  hydrALAZINE (APRESOLINE) injection 10 mg (10 mg Intravenous Given 10/19/18 1119)  iopamidol (ISOVUE-370) 76 % injection 100 mL (100 mLs Intravenous Contrast Given 10/19/18 1205)     Initial Impression / Assessment and Plan / ED Course  I have reviewed the triage vital signs and the nursing notes.  Pertinent labs & imaging results that were available during my care of the patient were reviewed by me and considered in my medical decision making (see chart for details).    [name] is a 84 y.o. female here with back pain. Recent admission for sepsis from UTI and is on keflex. Urine culture from recent visit showed minimal growth. Afebrile in the ED. She was also diagnosed with R leg DVT and is on lovenox. Consider AAA vs arthritis pain in the back. Neurovascular intact in lower extremities. Will get labs, CT ab/pel.  3:10  PM Initial noncon CT showed 3 cm AAA. CTA showed AAA but no leak or RP bleed. I talked to Dr. Edilia Bo, who recommend outpatient follow up in several years. She has unchanged compression fractures. She has pulmonary nodule that is unchanged. Stable for discharge back to facility    Final Clinical Impressions(s) / ED Diagnoses   Final diagnoses:  Back pain    ED Discharge Orders    None       Charlynne Pander, MD 10/19/18 1512

## 2018-10-19 NOTE — ED Triage Notes (Signed)
Patient is having lower back pain. Patient was brought in from [facility]. Patient has not had any accidents or falls per [facility].

## 2018-10-19 NOTE — ED Notes (Signed)
Bed: [id number] Expected date:  Expected time:  Means of arrival:  Comments: Back pain, chronic dementia

## 2018-10-19 NOTE — ED Notes (Signed)
Requested patient to urinate. 

## 2018-10-20 ENCOUNTER — Telehealth: Payer: Self-pay | Admitting: Vascular Surgery

## 2018-10-20 NOTE — Telephone Encounter (Signed)
-----   Message from Sharee PimpleMarilyn K McChesney, RN sent at 10/20/2018 10:06 AM EST ----- Regarding: FW: office apt for ED followup  ----- Message ----- From: Chuck Hintickson, Christopher S, MD Sent: 10/19/2018   4:44 PM EST To: [facility] Charge Pool Subject: office apt                                     The emergency department it was long called about a patient with a 3 cm abdominal aortic aneurysm with no evidence of leak.  She is [age] with a history of dementia.  However, after reviewing her films she does have a saccular aneurysm and therefore I think she should be in the office for further evaluation.  This can be in the next several weeks if possible.  It would be helpful if family could come with her to that visit.  Thank you. CD

## 2018-10-20 NOTE — Telephone Encounter (Signed)
sch appt spk to [facility] 11/19/2018 1145am f/u MD

## 2018-11-19 ENCOUNTER — Ambulatory Visit: Payer: Medicare Other | Admitting: Vascular Surgery

## 2018-12-29 ENCOUNTER — Encounter (HOSPITAL_COMMUNITY): Payer: Self-pay

## 2018-12-29 ENCOUNTER — Emergency Department (HOSPITAL_COMMUNITY)
Admission: EM | Admit: 2018-12-29 | Discharge: 2018-12-29 | Disposition: A | Discharge status: Hospice | Attending: Emergency Medicine | Admitting: Emergency Medicine

## 2018-12-29 ENCOUNTER — Emergency Department (HOSPITAL_COMMUNITY)

## 2018-12-29 DIAGNOSIS — Z79899 Other long term (current) drug therapy: Secondary | ICD-10-CM | POA: Insufficient documentation

## 2018-12-29 DIAGNOSIS — Y92128 Other place in nursing home as the place of occurrence of the external cause: Secondary | ICD-10-CM | POA: Insufficient documentation

## 2018-12-29 DIAGNOSIS — Y9389 Activity, other specified: Secondary | ICD-10-CM | POA: Diagnosis not present

## 2018-12-29 DIAGNOSIS — Z7984 Long term (current) use of oral hypoglycemic drugs: Secondary | ICD-10-CM | POA: Insufficient documentation

## 2018-12-29 DIAGNOSIS — Z7902 Long term (current) use of antithrombotics/antiplatelets: Secondary | ICD-10-CM | POA: Insufficient documentation

## 2018-12-29 DIAGNOSIS — S50312A Abrasion of left elbow, initial encounter: Secondary | ICD-10-CM | POA: Insufficient documentation

## 2018-12-29 DIAGNOSIS — F0390 Unspecified dementia without behavioral disturbance: Secondary | ICD-10-CM | POA: Diagnosis not present

## 2018-12-29 DIAGNOSIS — Y999 Unspecified external cause status: Secondary | ICD-10-CM | POA: Diagnosis not present

## 2018-12-29 DIAGNOSIS — E119 Type 2 diabetes mellitus without complications: Secondary | ICD-10-CM | POA: Diagnosis not present

## 2018-12-29 DIAGNOSIS — W01198A Fall on same level from slipping, tripping and stumbling with subsequent striking against other object, initial encounter: Secondary | ICD-10-CM | POA: Insufficient documentation

## 2018-12-29 DIAGNOSIS — W19XXXA Unspecified fall, initial encounter: Secondary | ICD-10-CM

## 2018-12-29 DIAGNOSIS — I10 Essential (primary) hypertension: Secondary | ICD-10-CM | POA: Insufficient documentation

## 2018-12-29 DIAGNOSIS — Y92129 Unspecified place in nursing home as the place of occurrence of the external cause: Secondary | ICD-10-CM

## 2018-12-29 DIAGNOSIS — S0990XA Unspecified injury of head, initial encounter: Secondary | ICD-10-CM | POA: Diagnosis present

## 2018-12-29 MED ORDER — ACETAMINOPHEN 325 MG PO TABS
650.0000 mg | ORAL_TABLET | Freq: Once | ORAL | Status: AC
  Administered 2018-12-29: 650 mg via ORAL
  Filled 2018-12-29: qty 2

## 2018-12-29 NOTE — ED Triage Notes (Addendum)
Patient arrived via [facility] from [facility]. Patient was walking outside and got tripped up on a rug and fell.  Denies hitting head or LOC.  Abrasion to left elbow.  C/O left arm pain.  No deformites noted by ems.  Oriented to base line per nursing home staff.  Ambulatory at baseline.   Per ems patient is DNR but do not have paper work from facility.

## 2018-12-29 NOTE — ED Notes (Signed)
[facility] called  

## 2018-12-29 NOTE — ED Provider Notes (Signed)
  Face-to-face evaluation   History: Patient here for evaluation from her nursing care facility for injuries from fall.  She reportedly fell while walking.  Physical exam: Alert elderly female who is mildly uncomfortable.  Head and neck without visible trauma.  Extremities normal range of motion without swelling or deformity.  Patient is confused.  Medical screening examination/treatment/procedure(s) were conducted as a shared visit with non-physician practitioner(s) and myself.  I personally evaluated the patient during the encounter   Mancel Bale, MD 12/30/18 [phone]

## 2018-12-29 NOTE — Discharge Instructions (Signed)
Your CT scan of your head and neck are reassuring. The xrays of your elbow, chest/ribs, and foot are normal. You can take tylenol every 6 hours as needed for pain. Apply ice to areas of pain for 20 minutes at a time.  Follow up with your primary care provider regarding your visit today.

## 2018-12-29 NOTE — ED Provider Notes (Signed)
[facility]-EMERGENCY DEPT Provider Note   CSN: [id number] Arrival date & time: 12/29/18  1801    History   Chief Complaint Chief Complaint  Patient presents with  . Fall    HPI [name] is a 85 y.o. female with past medical history of dementia, diabetes, CAD, hypertension, presenting to the emergency department via EMS from [facility] after mechanical fall.  Per phone conversation with nursing home staff, patient was reportedly walking to dinner and when a different direction.  She was witnessed entering the carport and tripped over a rug.  And is thought that she hit her head.  No LOC.  She is at her mental baseline per nursing home staff.  No recent falls.  Patient complaining of some pain to her left elbow, left chest wall along the midaxillary line, and some mild neck pain.  Denies headache.  States she does not think she hit her head.  No other injuries reported.  History is limited per patient secondary to dementia.     The history is provided by the patient, the nursing home and the EMS personnel. The history is limited by the condition of the patient.    Past Medical History:  Diagnosis Date  . Anxiety   . Aspirin intolerance   . Bronchitis, mucopurulent recurrent (HCC)   . CAD (coronary artery disease)    2008, question slight anteroapical ischemia  . Carotid artery disease (HCC)    Doppler, December, 2007, bilateral 0-39%  . Dementia (HCC)    Senile dementia with delusional features  . Diabetes mellitus   . DJD (degenerative joint disease)   . Ejection fraction    EF 70%, nuclear, 2007  . Esophageal stricture   . GERD (gastroesophageal reflux disease)   . Hematoma    Large hematoma from fall, November, 2009, Coumadin stopped  . Hemorrhoids   . Hiatal hernia   . Hx of CABG    2003, ... LIMA not used... small caliber  . Hyperlipidemia   . Hypertension   . IBS (irritable bowel syndrome)   . Nephrolithiasis   . Obesity   .  Osteoporosis   . Palpitations   . Pulmonary embolism (HCC)   . Right knee injury   . Senile dementia with delusional features (HCC)   . SOB (shortness of breath)   . Subclavian arterial stenosis (HCC)    PCI  . Warfarin anticoagulation    stopped 09/2008 with large hematoma from fall    Patient Active Problem List   Diagnosis Date Noted  . Rash 10/12/2018  . Leg swelling 10/12/2018  . Unspecified vitamin D deficiency 02/18/2013  . Hand fracture 08/21/2012  . Hand pain 08/20/2012  . UTI (lower urinary tract infection) 10/27/2011  . Fall 10/27/2011  . Hyperlipidemia 07/30/2011  . Hypertension   . Subclavian arterial stenosis (HCC)   . Diabetes mellitus (HCC)   . GERD (gastroesophageal reflux disease)   . Hx of CABG   . Carotid artery disease (HCC)   . Aspirin intolerance   . CAD (coronary artery disease)   . ESOPHAGEAL STRICTURE 03/01/2010  . DYSPHAGIA UNSPECIFIED 03/01/2010  . SENILE DEMENTIA WITH DELUSIONAL FEATURES 09/16/2008  . PERIPHERAL VASCULAR DISEASE 12/18/2007  . BRONCHITIS, RECURRENT 12/18/2007  . NEPHROLITHIASIS 12/18/2007  . OSTEOPOROSIS 12/18/2007  . Nonspecific (abnormal) findings on radiological and other examination of body structure 12/18/2007  . ABNORMAL CHEST XRAY 12/18/2007  . ANXIETY 11/10/2007  . IBS 11/10/2007  . DEGENERATIVE JOINT DISEASE 11/10/2007  Past Surgical History:  Procedure Laterality Date  . ABDOMINAL HYSTERECTOMY    . APPENDECTOMY    . cabg  1 vessel w/SVG to LAD  2003  . CATARACT EXTRACTION    . FRACTURE SURGERY    . HEMORRHOID SURGERY    . mult orthopedic procedures     Dr. Arnette Norris arm, CTS,cyst removal  . open heart surgery       OB History   No obstetric history on file.      Home Medications    Prior to Admission medications   Medication Sig Start Date End Date Taking? Authorizing Provider  acetaminophen (TYLENOL) 325 MG tablet Take 325 mg by mouth every 6 (six) hours as needed for mild pain.      [provider]  aluminum-magnesium hydroxide-simethicone (MAALOX) 200-200-20 MG/5ML SUSP Take 30 mLs by mouth every 6 (six) hours as needed (heartburn or indigestion).    [provider]  bumetanide (BUMEX) 0.5 MG tablet Take 0.5 mg by mouth daily.    [provider]  Calcium Carbonate-Vitamin D (CALCIUM 600+D) 600-400 MG-UNIT tablet Take 1 tablet by mouth 2 (two) times daily.    [provider]  chlorhexidine (PERIDEX) 0.12 % solution Use as directed 15 mLs in the mouth or throat daily.     [provider]  clopidogrel (PLAVIX) 75 MG tablet Take 75 mg by mouth daily.    [provider]  enoxaparin (LOVENOX) 60 MG/0.6ML injection Inject 0.6 mLs (60 mg total) into the skin every 12 (twelve) hours. 10/17/18 11/16/18  Purohit, Salli Quarry, MD  famotidine (PEPCID) 20 MG tablet Take 20 mg by mouth daily.    [provider]  glimepiride (AMARYL) 1 MG tablet Take 1 mg by mouth daily with breakfast.    [provider]  losartan (COZAAR) 50 MG tablet Take 50 mg by mouth daily.    [provider]  magnesium hydroxide (MILK OF MAGNESIA) 400 MG/5ML suspension Take 30 mLs by mouth daily as needed for mild constipation.    [provider]  metoprolol succinate (TOPROL-XL) 25 MG 24 hr tablet Take 25 mg by mouth daily.    [provider]  QUEtiapine (SEROQUEL) 25 MG tablet Take 0.5 tablets (12.5 mg total) by mouth at bedtime. 12/25/12   Michele Mcalpine, MD  traMADol (ULTRAM) 50 MG tablet Take 1 tablet (50 mg total) by mouth every 6 (six) hours as needed. 10/19/18   Charlynne Pander, MD  vitamin B-12 (CYANOCOBALAMIN) 1000 MCG tablet Take 1,000 mcg by mouth daily.    [provider]    Family History Family History  Problem Relation Age of Onset  . Stroke Father   . Cancer Mother        bladder cancer  . Hypertension Mother   . Stroke Mother   . Leukemia Sister   . Colon cancer Neg Hx     Social  History Social History   Tobacco Use  . Smoking status: Never Smoker  . Smokeless tobacco: Never Used  Substance Use Topics  . Alcohol use: No  . Drug use: No     Allergies   Aspirin; Iodine; Irbesartan-hydrochlorothiazide; and Penicillins   Review of Systems Review of Systems  Unable to perform ROS: Dementia  Musculoskeletal: Positive for arthralgias and neck pain. Negative for back pain.  Neurological: Negative for headaches.     Physical Exam Updated Vital Signs BP (!) 196/68   Pulse 60   Temp 98.1 F (36.7 C) (  Oral)   Resp 18   SpO2 93%   Physical Exam Vitals signs and nursing note reviewed.  Constitutional:      General: She is not in acute distress.    Appearance: She is well-developed. She is not ill-appearing.  HENT:     Head: Normocephalic and atraumatic.     Comments: No scalp hematoma or facial trauma. Eyes:     Conjunctiva/sclera: Conjunctivae normal.     Pupils: Pupils are equal, round, and reactive to light.  Neck:     Musculoskeletal: Normal range of motion and neck supple. No neck rigidity or muscular tenderness.  Cardiovascular:     Rate and Rhythm: Normal rate and regular rhythm.  Pulmonary:     Effort: Pulmonary effort is normal. No respiratory distress.     Breath sounds: Normal breath sounds.  Abdominal:     General: Bowel sounds are normal.     Palpations: Abdomen is soft.     Tenderness: There is no abdominal tenderness. There is no guarding.  Musculoskeletal:     Comments: No tenderness to the spine or paraspinal musculature.  No bony step-offs or gross deformities. Left posterior lateral elbow with ecchymosis and superficial abrasion.  Not grossly contaminated.  Normal range of motion of the shoulder, elbow, and wrist with pain.  No obvious deformity. Pelvis is stable.  No pain with internal and external rotation of the hips.  No deformity. Mild swelling and ecchymosis to the right distal foot.  No tenderness.  Skin:    General:  Skin is warm.  Neurological:     Mental Status: She is alert.     Comments: Patient is alert, oriented to person, disoriented to place and time.  Cranial nerves grossly intact.  EOMs grossly intact.  Normal tone.  Equal strength bilateral upper extremities as well as lower extremities  Psychiatric:        Mood and Affect: Mood normal.        Behavior: Behavior normal.      ED Treatments / Results  Labs (all labs ordered are listed, but only abnormal results are displayed) Labs Reviewed - No data to display  EKG EKG Interpretation  Date/Time:  Monday December 29 2018 18:57:45 EST Ventricular Rate:  58 PR Interval:    QRS Duration: 102 QT Interval:  450 QTC Calculation: 442 R Axis:   136 Text Interpretation:  Sinus rhythm Prolonged PR interval Low voltage with right axis deviation Probable right ventricular hypertrophy Since last tracing of earlier today No old tracing to compare Confirmed by Mancel Bale [phone]) on 12/29/2018 7:09:45 PM   Radiology Dg Ribs Unilateral W/chest Left  Result Date: 12/29/2018 CLINICAL DATA:  Fall, rib pain EXAM: LEFT RIBS AND CHEST - 3+ VIEW COMPARISON:  10/19/2018 FINDINGS: Previous coronary bypass changes. Stable cardiomegaly. Coronary and subclavian stents also present. Aorta atherosclerotic. Lungs remain clear. Negative for airspace process, edema, effusion or pneumothorax. No significant displaced rib fracture or focal abnormality. No chest wall subcutaneous emphysema or soft tissue asymmetry. IMPRESSION: Postop changes as above. Cardiomegaly without acute process. No acute osseous finding by plain radiography Electronically Signed   By: Judie Petit.  Shick M.D.   On: 12/29/2018 19:44   Dg Elbow Complete Left  Result Date: 12/29/2018 CLINICAL DATA:  Fall, elbow pain EXAM: LEFT ELBOW - COMPLETE 3+ VIEW COMPARISON:  None. FINDINGS: There is no evidence of fracture, dislocation, or joint effusion. There is no evidence of arthropathy or other focal bone  abnormality. Soft tissues are unremarkable.  IMPRESSION: Negative. Electronically Signed   By: Charlett Nose M.D.   On: 12/29/2018 19:43   Ct Head Wo Contrast  Result Date: 12/29/2018 CLINICAL DATA:  Fall. EXAM: CT HEAD WITHOUT CONTRAST CT CERVICAL SPINE WITHOUT CONTRAST TECHNIQUE: Multidetector CT imaging of the head and cervical spine was performed following the standard protocol without intravenous contrast. Multiplanar CT image reconstructions of the cervical spine were also generated. COMPARISON:  08/25/2018 FINDINGS: CT HEAD FINDINGS Brain: There is atrophy and chronic small vessel disease changes. No acute intracranial abnormality. Specifically, no hemorrhage, hydrocephalus, mass lesion, acute infarction, or significant intracranial injury. Vascular: No hyperdense vessel or unexpected calcification. Skull: No acute calvarial abnormality. Sinuses/Orbits: Visualized paranasal sinuses and mastoids clear. Orbital soft tissues unremarkable. Other: None CT CERVICAL SPINE FINDINGS Alignment: Slight anterolisthesis of C4 on C5 related to facet disease. Skull base and vertebrae: No acute fracture. No primary bone lesion or focal pathologic process. Soft tissues and spinal canal: No prevertebral fluid or swelling. No visible canal hematoma. Disc levels: Degenerative disc disease in the lower cervical spine with disc space narrowing and spurring. Advanced diffuse degenerative facet disease bilaterally. Upper chest: No acute findings Other: Carotid artery calcifications. IMPRESSION: Atrophy, chronic microvascular disease. No acute intracranial abnormality. Degenerative disc and facet disease in the cervical spine. No acute bony abnormality. Electronically Signed   By: Charlett Nose M.D.   On: 12/29/2018 20:00   Ct Cervical Spine Wo Contrast  Result Date: 12/29/2018 CLINICAL DATA:  Fall. EXAM: CT HEAD WITHOUT CONTRAST CT CERVICAL SPINE WITHOUT CONTRAST TECHNIQUE: Multidetector CT imaging of the head and cervical  spine was performed following the standard protocol without intravenous contrast. Multiplanar CT image reconstructions of the cervical spine were also generated. COMPARISON:  08/25/2018 FINDINGS: CT HEAD FINDINGS Brain: There is atrophy and chronic small vessel disease changes. No acute intracranial abnormality. Specifically, no hemorrhage, hydrocephalus, mass lesion, acute infarction, or significant intracranial injury. Vascular: No hyperdense vessel or unexpected calcification. Skull: No acute calvarial abnormality. Sinuses/Orbits: Visualized paranasal sinuses and mastoids clear. Orbital soft tissues unremarkable. Other: None CT CERVICAL SPINE FINDINGS Alignment: Slight anterolisthesis of C4 on C5 related to facet disease. Skull base and vertebrae: No acute fracture. No primary bone lesion or focal pathologic process. Soft tissues and spinal canal: No prevertebral fluid or swelling. No visible canal hematoma. Disc levels: Degenerative disc disease in the lower cervical spine with disc space narrowing and spurring. Advanced diffuse degenerative facet disease bilaterally. Upper chest: No acute findings Other: Carotid artery calcifications. IMPRESSION: Atrophy, chronic microvascular disease. No acute intracranial abnormality. Degenerative disc and facet disease in the cervical spine. No acute bony abnormality. Electronically Signed   By: Charlett Nose M.D.   On: 12/29/2018 20:00   Dg Foot Complete Right  Result Date: 12/29/2018 CLINICAL DATA:  Trip and fall injury. EXAM: RIGHT FOOT COMPLETE - 3+ VIEW COMPARISON:  Right ankle 10/01/2008 FINDINGS: Diffuse bone demineralization. Degenerative changes in the interphalangeal joints, first metatarsal-phalangeal joint, and intertarsal joints. No evidence of acute fracture or dislocation. No focal bone lesion or bone destruction. Vascular calcifications in the soft tissues. IMPRESSION: Diffuse bone demineralization and degenerative changes. No acute bony abnormalities.  Electronically Signed   By: Burman Nieves M.D.   On: 12/29/2018 19:43    Procedures Procedures (including critical care time)  Medications Ordered in ED Medications  acetaminophen (TYLENOL) tablet 650 mg (650 mg Oral Given 12/29/18 2000)     Initial Impression / Assessment and Plan / ED Course  I have reviewed the  triage vital signs and the nursing notes.  Pertinent labs & imaging results that were available during my care of the patient were reviewed by me and considered in my medical decision making (see chart for details).        Pt w hx dementia, presenting from [facility] nursing facility presenting after mechanical fall. Pt with complaints of left arm, neck and chest wall pain after the fall. Is at mental baseline per nursing home staff. Small abrasion to left elbow. Exam otherwise appears atraumatic. Unable to perform full neuro exam 2.t dementia however no obvious deficits. Given supposed hx of head trauma per nursing home staff, CT ordered and is neg for acute pathology. Xrays are negative for fracture. Pt is well-appearing, no distress in the ED. Pt will be discharged back to [facility].  Patient discussed with and seen by Dr. Effie Shy, who agrees with care plan.  Final Clinical Impressions(s) / ED Diagnoses   Final diagnoses:  Fall at nursing home, initial encounter  Abrasion of left elbow, initial encounter    ED Discharge Orders    None       Robinson, [place] N, PA-C 12/29/18 2023    Mancel Bale, MD 12/30/18 [phone]

## 2018-12-29 NOTE — ED Notes (Signed)
Bed: WA03 Expected date:  Expected time:  Means of arrival:  Comments: 85 yo fall 

## 2019-07-05 ENCOUNTER — Emergency Department (HOSPITAL_COMMUNITY)
Admission: EM | Admit: 2019-07-05 | Discharge: 2019-07-06 | Disposition: A | Discharge status: Home / Self Care | Attending: Emergency Medicine | Admitting: Emergency Medicine

## 2019-07-05 ENCOUNTER — Emergency Department (HOSPITAL_COMMUNITY)

## 2019-07-05 ENCOUNTER — Encounter (HOSPITAL_COMMUNITY): Payer: Self-pay | Admitting: Emergency Medicine

## 2019-07-05 DIAGNOSIS — S0003XA Contusion of scalp, initial encounter: Secondary | ICD-10-CM | POA: Insufficient documentation

## 2019-07-05 DIAGNOSIS — E119 Type 2 diabetes mellitus without complications: Secondary | ICD-10-CM | POA: Diagnosis not present

## 2019-07-05 DIAGNOSIS — Y92012 Bathroom of single-family (private) house as the place of occurrence of the external cause: Secondary | ICD-10-CM | POA: Diagnosis not present

## 2019-07-05 DIAGNOSIS — Z951 Presence of aortocoronary bypass graft: Secondary | ICD-10-CM | POA: Insufficient documentation

## 2019-07-05 DIAGNOSIS — I2510 Atherosclerotic heart disease of native coronary artery without angina pectoris: Secondary | ICD-10-CM | POA: Diagnosis not present

## 2019-07-05 DIAGNOSIS — Z79899 Other long term (current) drug therapy: Secondary | ICD-10-CM | POA: Diagnosis not present

## 2019-07-05 DIAGNOSIS — R262 Difficulty in walking, not elsewhere classified: Secondary | ICD-10-CM | POA: Insufficient documentation

## 2019-07-05 DIAGNOSIS — Y9301 Activity, walking, marching and hiking: Secondary | ICD-10-CM | POA: Diagnosis not present

## 2019-07-05 DIAGNOSIS — I10 Essential (primary) hypertension: Secondary | ICD-10-CM | POA: Diagnosis not present

## 2019-07-05 DIAGNOSIS — F0390 Unspecified dementia without behavioral disturbance: Secondary | ICD-10-CM | POA: Diagnosis not present

## 2019-07-05 DIAGNOSIS — W19XXXA Unspecified fall, initial encounter: Secondary | ICD-10-CM | POA: Insufficient documentation

## 2019-07-05 DIAGNOSIS — Y998 Other external cause status: Secondary | ICD-10-CM | POA: Insufficient documentation

## 2019-07-05 DIAGNOSIS — S0990XA Unspecified injury of head, initial encounter: Secondary | ICD-10-CM | POA: Diagnosis present

## 2019-07-05 LAB — URINALYSIS, ROUTINE W REFLEX MICROSCOPIC
Bilirubin Urine: NEGATIVE
Glucose, UA: NEGATIVE mg/dL
Hgb urine dipstick: NEGATIVE
Ketones, ur: NEGATIVE mg/dL
Leukocytes,Ua: NEGATIVE
Nitrite: NEGATIVE
Protein, ur: NEGATIVE mg/dL
Specific Gravity, Urine: 1.006 (ref 1.005–1.030)
pH: 6 (ref 5.0–8.0)

## 2019-07-05 NOTE — ED Provider Notes (Signed)
[facility] EMERGENCY DEPARTMENT Provider Note   CSN: [id number] Arrival date & time: 07/05/19  2052     History   Chief Complaint Chief Complaint  Patient presents with  . Fall    Unwitnessed    HPI [name] is a 85 y.o. female.     Pt presents to the ED today with a fall.  Pt has dementia and frequent falls.  She is not supposed to ambulate on her own, but tried to get to the bathroom tonight and fell.  She denies any pain other than her low back.       Past Medical History:  Diagnosis Date  . Anxiety   . Aspirin intolerance   . Bronchitis, mucopurulent recurrent ([place])   . CAD (coronary artery disease)    2008, question slight anteroapical ischemia  . Carotid artery disease ([place])    Doppler, December, 2007, bilateral 0-39%  . Dementia ([place])    Senile dementia with delusional features  . Diabetes mellitus   . DJD (degenerative joint disease)   . Ejection fraction    EF 70%, nuclear, 2007  . Esophageal stricture   . GERD (gastroesophageal reflux disease)   . Hematoma    Large hematoma from fall, November, 2009, Coumadin stopped  . Hemorrhoids   . Hiatal hernia   . Hx of CABG    2003, ... LIMA not used... small caliber  . Hyperlipidemia   . Hypertension   . IBS (irritable bowel syndrome)   . Nephrolithiasis   . Obesity   . Osteoporosis   . Palpitations   . Pulmonary embolism ([place])   . Right knee injury   . Senile dementia with delusional features ([place])   . SOB (shortness of breath)   . Subclavian arterial stenosis ([facility])    PCI  . Warfarin anticoagulation    stopped 09/2008 with large hematoma from fall    Patient Active Problem List   Diagnosis Date Noted  . Rash 10/12/2018  . Leg swelling 10/12/2018  . Unspecified vitamin D deficiency 02/18/2013  . Hand fracture 08/21/2012  . Hand pain 08/20/2012  . UTI (lower urinary tract infection) 10/27/2011  . Fall 10/27/2011  . Hyperlipidemia 07/30/2011  . Hypertension   .  Subclavian arterial stenosis ([place])   . Diabetes mellitus ()   . GERD (gastroesophageal reflux disease)   . Hx of CABG   . Carotid artery disease ([place])   . Aspirin intolerance   . CAD (coronary artery disease)   . ESOPHAGEAL STRICTURE 03/01/2010  . DYSPHAGIA UNSPECIFIED 03/01/2010  . SENILE DEMENTIA WITH DELUSIONAL FEATURES 09/16/2008  . PERIPHERAL VASCULAR DISEASE 12/18/2007  . BRONCHITIS, RECURRENT 12/18/2007  . NEPHROLITHIASIS 12/18/2007  . OSTEOPOROSIS 12/18/2007  . Nonspecific (abnormal) findings on radiological and other examination of body structure 12/18/2007  . ABNORMAL CHEST XRAY 12/18/2007  . ANXIETY 11/10/2007  . IBS 11/10/2007  . DEGENERATIVE JOINT DISEASE 11/10/2007    Past Surgical History:  Procedure Laterality Date  . ABDOMINAL HYSTERECTOMY    . APPENDECTOMY    . cabg  1 vessel w/SVG to LAD  2003  . CATARACT EXTRACTION    . FRACTURE SURGERY    . HEMORRHOID SURGERY    . mult orthopedic procedures     Dr. Cleone Slim arm, CTS,cyst removal  . open heart surgery       OB History   No obstetric history on file.      Home Medications    Prior to Admission medications  Medication Sig Start Date End Date Taking? Authorizing Provider  acetaminophen (TYLENOL) 160 MG/5ML liquid Take 640 mg by mouth every 4 (four) hours as needed for pain (fever over 101 deg F).   Yes [provider]  acetaminophen (TYLENOL) 500 MG tablet Take 500 mg by mouth every 6 (six) hours as needed (pain).   Yes [provider]  aluminum-magnesium hydroxide-simethicone (MAALOX) 200-200-20 MG/5ML SUSP Take 30 mLs by mouth every 6 (six) hours as needed (heartburn or indigestion).   Yes [provider]  bumetanide (BUMEX) 0.5 MG tablet Take 0.5 mg by mouth daily.   Yes [provider]  carvedilol (COREG) 3.125 MG tablet Take 1.5625 mg by mouth 2 (two) times daily with a meal.   Yes [provider]  chlorhexidine (PERIDEX) 0.12 % solution Use as  directed 15 mLs in the mouth or throat daily.    Yes [provider]  furosemide (LASIX) 20 MG tablet Take 10 mg by mouth daily.   Yes [provider]  guaifenesin (ROBAFEN) 100 MG/5ML syrup Take 200 mg by mouth every 6 (six) hours as needed for cough.   Yes [provider]  haloperidol (HALDOL) 2 MG/ML solution Take 0.5 mg by mouth 2 (two) times daily. For nausea/vomiting, restlessness and agitation   Yes [provider]  hydrOXYzine (ATARAX/VISTARIL) 25 MG tablet Take 25 mg by mouth daily.   Yes [provider]  isosorbide mononitrate (ISMO) 10 MG tablet Take 5 mg by mouth 2 (two) times daily.   Yes [provider]  loperamide (IMODIUM) 2 MG capsule Take 4 mg by mouth as needed for diarrhea or loose stools (not to exceed 8 doses in 24 hours).   Yes [provider]  magnesium hydroxide (MILK OF MAGNESIA) 400 MG/5ML suspension Take 30 mLs by mouth at bedtime as needed (constipation).    Yes [provider]  morphine (ROXANOL) 20 MG/ML concentrated solution Take 2.5 mg by mouth 4 (four) times daily.   Yes [provider]  Neomycin-Bacitracin-Polymyxin (TRIPLE ANTIBIOTIC) [phone] OINT Apply 1 application topically daily as needed (wound care).   Yes [provider]  QUEtiapine (SEROQUEL) 25 MG tablet Take 0.5 tablets (12.5 mg total) by mouth at bedtime. Patient taking differently: Take 12.5 mg by mouth 2 (two) times daily.  12/25/12  Yes Michele McalpineNadel, Scott M, MD  sennosides (SENOKOT) 8.8 MG/5ML syrup Take 5 mLs by mouth daily.   Yes [provider]  traMADol (ULTRAM) 50 MG tablet Take 1 tablet (50 mg total) by mouth every 6 (six) hours as needed. Patient not taking: Reported on 07/05/2019 10/19/18   Charlynne PanderYao, David Hsienta, MD    Family History Family History  Problem Relation Age of Onset  . Stroke Father   . Cancer Mother        bladder cancer  . Hypertension Mother   . Stroke Mother   . Leukemia  Sister   . Colon cancer Neg Hx     Social History Social History   Tobacco Use  . Smoking status: Never Smoker  . Smokeless tobacco: Never Used  Substance Use Topics  . Alcohol use: No  . Drug use: No     Allergies   Aspirin, Iodine, Irbesartan-hydrochlorothiazide, and Penicillins   Review of Systems Review of Systems  Musculoskeletal: Positive for back pain.  All other systems reviewed and are negative.    Physical Exam Updated Vital Signs BP (!) 156/72   Pulse 63   Temp 98.5 F (36.9  C) (Oral)   Resp (!) 21   SpO2 97%   Physical Exam Vitals signs and nursing note reviewed.  Constitutional:      Appearance: Normal appearance.  HENT:     Head: Normocephalic.     Comments: Contusion to right posterior head    Right Ear: External ear normal.     Left Ear: External ear normal.     Nose: Nose normal.     Mouth/Throat:     Mouth: Mucous membranes are moist.     Pharynx: Oropharynx is clear.  Eyes:     Extraocular Movements: Extraocular movements intact.     Conjunctiva/sclera: Conjunctivae normal.     Pupils: Pupils are equal, round, and reactive to light.  Neck:     Musculoskeletal: Normal range of motion and neck supple.  Cardiovascular:     Rate and Rhythm: Normal rate and regular rhythm.     Pulses: Normal pulses.     Heart sounds: Normal heart sounds.  Pulmonary:     Effort: Pulmonary effort is normal.     Breath sounds: Normal breath sounds.  Abdominal:     General: Abdomen is flat. Bowel sounds are normal.     Palpations: Abdomen is soft.  Musculoskeletal:       Arms:  Skin:    General: Skin is warm.     Capillary Refill: Capillary refill takes less than 2 seconds.  Neurological:     Mental Status: She is alert. Mental status is at baseline.  Psychiatric:        Mood and Affect: Mood normal.        Behavior: Behavior normal.      ED Treatments / Results  Labs (all labs ordered are listed, but only abnormal results are displayed)  Labs Reviewed  URINALYSIS, ROUTINE W REFLEX MICROSCOPIC - Abnormal; Notable for the following components:      Result Value   Color, Urine STRAW (*)    All other components within normal limits    EKG None  Radiology Dg Lumbar Spine Complete  Result Date: 07/05/2019 CLINICAL DATA:  Post unwitnessed fall. EXAM: LUMBAR SPINE - COMPLETE 4+ VIEW COMPARISON:  Lumbar spine CT 10/19/2018 FINDINGS: Chronic compression deformities in the lower thoracic spine, as seen on prior exam. No evidence of acute fracture. No listhesis or traumatic subluxation. Degenerative disc disease at L1-L2 as well as multilevel facet hypertrophy, as seen on prior exam. Sacroiliac joints are congruent. Stool ball distends the rectum. Aortic atherosclerosis. IMPRESSION: Chronic lower thoracic compression deformities, as seen on prior exam. No evidence of acute fracture. Electronically Signed   By: Narda Rutherford M.D.   On: 07/05/2019 21:57   Dg Elbow Complete Right  Result Date: 07/05/2019 CLINICAL DATA:  Post unwitnessed fall. Abrasion to right elbow. EXAM: RIGHT ELBOW - COMPLETE 3+ VIEW COMPARISON:  Elbow radiograph 07/12/2017 FINDINGS: Remote posttraumatic and postsurgical change of the proximal ulna with plate and screw fixation. No hardware complication or acute fracture. No elbow joint effusion. No focal soft tissue abnormality. IMPRESSION: No acute fracture. Remote postsurgical and posttraumatic change of the proximal ulna with intact hardware. Electronically Signed   By: Narda Rutherford M.D.   On: 07/05/2019 21:55   Ct Head Wo Contrast  Result Date: 07/05/2019 CLINICAL DATA:  Unwitnessed fall. Posttraumatic headache. EXAM: CT HEAD WITHOUT CONTRAST TECHNIQUE: Contiguous axial images were obtained from the base of the skull through the vertex without intravenous contrast. COMPARISON:  Head CT 12/29/2018 FINDINGS: Brain: No intracranial hemorrhage,  mass effect, or midline shift. Stable degree of atrophy and chronic  small vessel ischemia. No hydrocephalus. The basilar cisterns are patent. No evidence of territorial infarct or acute ischemia. No extra-axial or intracranial fluid collection. Vascular: Atherosclerosis of skullbase vasculature without hyperdense vessel or abnormal calcification. Skull: No fracture or focal lesion. Hyperostosis frontalis, as seen on prior. Sinuses/Orbits: Paranasal sinuses and mastoid air cells are clear. The visualized orbits are unremarkable. Bilateral cataract resection. Other: Right posterior vertex scalp hematoma. IMPRESSION: 1. Right posterior vertex scalp hematoma. No acute intracranial abnormality. No skull fracture. 2. Stable atrophy and chronic small vessel ischemia. Electronically Signed   By: Narda RutherfordMelanie  Sanford M.D.   On: 07/05/2019 22:11    Procedures Procedures (including critical care time)  Medications Ordered in ED Medications - No data to display   Initial Impression / Assessment and Plan / ED Course  I have reviewed the triage vital signs and the nursing notes.  Pertinent labs & imaging results that were available during my care of the patient were reviewed by me and considered in my medical decision making (see chart for details).    Xrays/CT ok.  No UTI.   Pt is stable for d/c back to her assisted living facility.  Return if worse.  Final Clinical Impressions(s) / ED Diagnoses   Final diagnoses:  Fall, initial encounter  Contusion of scalp, initial encounter  Ambulatory dysfunction    ED Discharge Orders    None       Jacalyn LefevreHaviland, Danniel Tones, MD 07/05/19 2231

## 2019-07-05 NOTE — ED Triage Notes (Signed)
Pt arrived via [facility]; pt from [place] with c/o unwitnessed fall; pt apparently walks with assistance but decided to use the restroom without assistance and found laying  On the floor on her R side against nightstand. Pt has abrasio to L temporal and R elbow; denies LOC; no blood thinners; pt has had night meds; 138/86; 66; 98% on RA; 97.8

## 2019-07-05 NOTE — ED Notes (Signed)
[facility] called  

## 2020-06-05 DEATH — deceased
